# Patient Record
Sex: Female | Born: 1965 | Race: White | Hispanic: No | Marital: Married | Smoking: Never smoker
Health system: Southern US, Community
[De-identification: ages and names within clinical notes are randomized; demographics above are authoritative.]

## PROBLEM LIST (undated history)

## (undated) DIAGNOSIS — E119 Type 2 diabetes mellitus without complications: Secondary | ICD-10-CM

## (undated) DIAGNOSIS — I10 Essential (primary) hypertension: Secondary | ICD-10-CM

## (undated) DIAGNOSIS — D649 Anemia, unspecified: Secondary | ICD-10-CM

## (undated) DIAGNOSIS — N879 Dysplasia of cervix uteri, unspecified: Secondary | ICD-10-CM

## (undated) DIAGNOSIS — G43909 Migraine, unspecified, not intractable, without status migrainosus: Secondary | ICD-10-CM

## (undated) DIAGNOSIS — K219 Gastro-esophageal reflux disease without esophagitis: Secondary | ICD-10-CM

## (undated) DIAGNOSIS — R87619 Unspecified abnormal cytological findings in specimens from cervix uteri: Secondary | ICD-10-CM

## (undated) DIAGNOSIS — N200 Calculus of kidney: Secondary | ICD-10-CM

## (undated) HISTORY — PX: HYSTEROSCOPY W/ ENDOMETRIAL ABLATION: SUR665

## (undated) HISTORY — DX: Migraine, unspecified, not intractable, without status migrainosus: G43.909

## (undated) HISTORY — PX: WISDOM TOOTH EXTRACTION: SHX21

## (undated) HISTORY — DX: Essential (primary) hypertension: I10

## (undated) HISTORY — DX: Type 2 diabetes mellitus without complications: E11.9

## (undated) HISTORY — PX: LASER ABLATION OF THE CERVIX: SHX1949

## (undated) HISTORY — DX: Unspecified abnormal cytological findings in specimens from cervix uteri: R87.619

## (undated) HISTORY — DX: Calculus of kidney: N20.0

---

## 1989-10-09 DIAGNOSIS — R87619 Unspecified abnormal cytological findings in specimens from cervix uteri: Secondary | ICD-10-CM

## 1989-10-09 HISTORY — DX: Unspecified abnormal cytological findings in specimens from cervix uteri: R87.619

## 1999-04-06 ENCOUNTER — Encounter: Payer: Self-pay | Admitting: Emergency Medicine

## 1999-04-06 ENCOUNTER — Emergency Department (HOSPITAL_COMMUNITY): Admission: EM | Admit: 1999-04-06 | Discharge: 1999-04-06 | Payer: Self-pay | Admitting: Emergency Medicine

## 2004-04-29 ENCOUNTER — Other Ambulatory Visit: Admission: RE | Admit: 2004-04-29 | Discharge: 2004-04-29 | Payer: Self-pay | Admitting: Obstetrics and Gynecology

## 2005-07-11 ENCOUNTER — Ambulatory Visit: Payer: Self-pay | Admitting: General Practice

## 2005-08-21 ENCOUNTER — Ambulatory Visit: Payer: Self-pay | Admitting: Family Medicine

## 2005-08-24 ENCOUNTER — Ambulatory Visit: Payer: Self-pay | Admitting: Family Medicine

## 2005-08-30 ENCOUNTER — Ambulatory Visit: Payer: Self-pay | Admitting: Family Medicine

## 2005-10-18 ENCOUNTER — Ambulatory Visit: Payer: Self-pay | Admitting: Internal Medicine

## 2005-11-15 ENCOUNTER — Ambulatory Visit: Payer: Self-pay | Admitting: Internal Medicine

## 2005-11-15 HISTORY — PX: COLONOSCOPY: SHX174

## 2006-07-25 ENCOUNTER — Ambulatory Visit: Payer: Self-pay | Admitting: General Practice

## 2006-07-30 ENCOUNTER — Ambulatory Visit: Payer: Self-pay | Admitting: General Practice

## 2007-04-30 ENCOUNTER — Encounter (INDEPENDENT_AMBULATORY_CARE_PROVIDER_SITE_OTHER): Payer: Self-pay | Admitting: Internal Medicine

## 2007-07-30 ENCOUNTER — Ambulatory Visit: Payer: Self-pay | Admitting: General Practice

## 2007-08-23 ENCOUNTER — Ambulatory Visit (HOSPITAL_COMMUNITY): Admission: RE | Admit: 2007-08-23 | Discharge: 2007-08-23 | Payer: Self-pay | Admitting: *Deleted

## 2008-08-05 ENCOUNTER — Ambulatory Visit: Payer: Self-pay | Admitting: General Practice

## 2009-08-11 ENCOUNTER — Ambulatory Visit: Payer: Self-pay | Admitting: General Practice

## 2010-08-03 ENCOUNTER — Ambulatory Visit: Payer: Self-pay | Admitting: General Practice

## 2011-02-21 NOTE — Op Note (Signed)
NAME:  Diana Lutz, Diana Lutz            ACCOUNT NO.:  [id number]   MEDICAL RECORD NO.:  [id number]          PATIENT TYPE:  AMB   LOCATION:  SDC                           FACILITY:  [facility]   PHYSICIAN:  [place] B. Earlene Plater, M.D.  DATE OF BIRTH:  September 25, 1966   DATE OF PROCEDURE:  08/23/2007  DATE OF DISCHARGE:                               OPERATIVE REPORT   PREOPERATIVE DIAGNOSIS:  Excessive menstrual bleeding.   POSTOPERATIVE DIAGNOSIS:  Excessive menstrual bleeding.   PROCEDURE:  Hysteroscopy and NovaSure.   SURGEON:  Chester Holstein. Earlene Plater, M.D.   ASSISTANT:  None.   ANESTHESIA:  LMA, general, and 20 mL of 1% Nesacaine paracervical block.   FINDINGS:  Normal appearing endometrium.   SPECIMENS:  None.   BLOOD LOSS:  Minimal.   COMPLICATIONS:  None.   INDICATIONS:  Patient with the above history for a minimally invasive  definitive surgical therapy of heavy menstrual bleeding.  The patient  was advised the risks of surgery, including infection, bleeding,  perforation, damage to surrounding organs, as well as the 50% amenorrhea  rate and 30% hypomenorrhea rate after such a procedure.  The patient is  also status post tubal sterilization.   PROCEDURE:  The patient was taken to the operating room and LMA general  anesthesia obtained.  She was prepped and draped in standard fashion.  Bladder emptied with in-and-out catheter.  Exam under anesthesia showed  an anteverted normal-sized uterus, no adnexal masses.   Speculum inserted, paracervical block placed.  Single-tooth attached to  the anterior lip of the cervix.  Uterus sounded to 9 cm.  Cervical  length estimated 4 cm.  Cervix then dilated to allow passage of the  diagnostic hysteroscope.  After being flushed with normal saline, cavity  was inspected.  No focal abnormalities were seen.   The cervix was then dilated to a #23 to allow passage of the NovaSure.  The NovaSure  was inserted to the fundus and the array deployed in a  standard  manner.  CO2 test was performed and passed.  Endometrium was  then ablated for 140 seconds.   The array was then retracted and the device removed.  Hysteroscope  reinserted and good endometrial coverage noted.   The instruments were removed.  Cervix hemostatic.  The patient tolerated  the procedure well, with no complications.  She was taken to the  recovery room awake, alert, and in stable condition.      Gerri Spore B. Earlene Plater, M.D.  Electronically Signed     WBD/MEDQ  D:  08/23/2007  T:  08/24/2007  Job:  [id number]

## 2011-07-18 LAB — DIFFERENTIAL
Basophils Absolute: 0
Basophils Relative: 1
Eosinophils Absolute: 0.1 — ABNORMAL LOW
Eosinophils Relative: 1
Lymphocytes Relative: 27
Lymphs Abs: 1.4
Monocytes Absolute: 0.5
Monocytes Relative: 10
Neutro Abs: 3.2
Neutrophils Relative %: 62

## 2011-07-18 LAB — CBC
HCT: 34.4 — ABNORMAL LOW
Hemoglobin: 11.3 — ABNORMAL LOW
MCHC: 32.8
MCV: 70 — ABNORMAL LOW
Platelets: 221
RBC: 4.91
RDW: 17.4 — ABNORMAL HIGH
WBC: 5.2

## 2011-07-18 LAB — PREGNANCY, URINE: Preg Test, Ur: NEGATIVE

## 2011-08-02 ENCOUNTER — Ambulatory Visit: Payer: Self-pay

## 2011-11-20 ENCOUNTER — Encounter: Payer: Self-pay | Admitting: Internal Medicine

## 2012-07-31 ENCOUNTER — Ambulatory Visit: Payer: Self-pay

## 2013-08-07 ENCOUNTER — Ambulatory Visit: Payer: Self-pay | Admitting: General Practice

## 2014-08-04 ENCOUNTER — Ambulatory Visit: Payer: Self-pay | Admitting: General Practice

## 2015-06-04 ENCOUNTER — Ambulatory Visit (INDEPENDENT_AMBULATORY_CARE_PROVIDER_SITE_OTHER): Payer: No Typology Code available for payment source | Admitting: Primary Care

## 2015-06-04 ENCOUNTER — Encounter: Payer: Self-pay | Admitting: Primary Care

## 2015-06-04 VITALS — BP 126/96 | HR 71 | Temp 97.4°F | Ht 68.0 in | Wt 236.8 lb

## 2015-06-04 DIAGNOSIS — E559 Vitamin D deficiency, unspecified: Secondary | ICD-10-CM | POA: Diagnosis not present

## 2015-06-04 DIAGNOSIS — I10 Essential (primary) hypertension: Secondary | ICD-10-CM | POA: Diagnosis not present

## 2015-06-04 MED ORDER — VITAMIN D (ERGOCALCIFEROL) 1.25 MG (50000 UNIT) PO CAPS: ORAL_CAPSULE | ORAL | Status: DC

## 2015-06-04 NOTE — Progress Notes (Signed)
Subjective:    Patient ID: Diana Lutz, female    DOB: 11-24-1965, 49 y.o.   MRN: [id number]  HPI  Diana Lutz is a 49 year old female who presents today to establish care and discuss the problems mentioned below. Will obtain old records.  1) Elevated Blood Pressure Readings: Recent readings over the past 2 weeks that have been checked by an RN at her occupation:  152/93, 153/108, and 138/93. BP in clinic today is 126/96.   She does endorse headache pressure. Denies chest pain, shortness of breath. She had labs drawn for wellness check through her occupation on 06/01/15 and are mostly unremarkable.   She is working to decrease her weight through cutting back sodas and drinking more water. She changed her diet 2 weeks ago and now consists of:  Breakfast: Eggs or oatmeal. Lunch: Sandwich, low calorie bread Dinner: Meat and vegetable. Snack: Celery, veggies, yogurt Beverages: Water only.  2) Migraines: History of. With aura of numbness around mouth and fingers. Experiences 1-2 migraines per month, will be present to neck and left temporal region. Will take [employer] with relief. Once treated with Imitrex without relief. Currently tolerable.  Review of Systems  Constitutional: Negative for unexpected weight change.  HENT: Negative for rhinorrhea.   Respiratory: Negative for cough and shortness of breath.   Cardiovascular: Negative for chest pain.  Gastrointestinal: Negative for diarrhea and constipation.  Neurological: Positive for headaches. Negative for dizziness and numbness.       Past Medical History  Diagnosis Date  . Hypertension   . Kidney stone   . Abnormal Pap smear of cervix 1991  . Migraines     Social History   Social History  . Marital Status: Married    Spouse Name: N/A  . Number of Children: N/A  . Years of Education: N/A   Occupational History  . Not on file.   Social History Main Topics  . Smoking status: Never Smoker   .  Smokeless tobacco: Not on file  . Alcohol Use: 0.0 oz/week    0 Standard drinks or equivalent per week     Comment: soical  . Drug Use: Not on file  . Sexual Activity: Not on file   Other Topics Concern  . Not on file   Social History Narrative   Married.   3 children   Works for [employer] as Engineer, manufacturing systems.   Enjoys four wheeling, working on the house    No past surgical history on file.  Family History  Problem Relation Age of Onset  . Hypertension Father   . Hypertension Mother   . Stroke Father   . Heart attack Father   . Prostate cancer Father   . Bladder Cancer Father   . Diverticulosis Mother   . Hypertension Brother   . Diabetes Brother     No Known Allergies  No current outpatient prescriptions on file prior to visit.   No current facility-administered medications on file prior to visit.    BP 126/96 mmHg  Pulse 71  Temp(Src) 97.4 F (36.3 C) (Oral)  Ht 5\' 8"  (1.727 m)  Wt 236 lb 12.8 oz (107.412 kg)  BMI 36.01 kg/m2  SpO2 97%    Objective:   Physical Exam  Constitutional: She is oriented to person, place, and time. She appears well-nourished.  Cardiovascular: Normal rate and regular rhythm.   Pulmonary/Chest: Effort normal and breath sounds normal.  Neurological: She is alert and oriented to person, place, and  time.  Skin: Skin is warm and dry.  Psychiatric: She has a normal mood and affect.          Assessment & Plan:

## 2015-06-04 NOTE — Assessment & Plan Note (Addendum)
Elevated readings at work for the past 2 weeks ranging from 150's/90's. Today elevated at 126/92, with headaches. Will start Lisinopril 10 mg tablets. Paper script provided to patient as they have a pharmacy at her occupation. Follow up in 2 weeks for re-evaluation of BP. BMP unremarkable from 06/01/15 Continue healthy diet and exercise.

## 2015-06-04 NOTE — Assessment & Plan Note (Signed)
Presents today with labs drawn 8/23 from [facility] with level of 20. RX sent for 50,000 unit capsules to take once weekly for 12 weeks. Will recheck levels in 12 weeks.

## 2015-06-04 NOTE — Progress Notes (Signed)
Pre visit review using our clinic review tool, if applicable. No additional management support is needed unless otherwise documented below in the visit note. 

## 2015-06-04 NOTE — Patient Instructions (Addendum)
Start Lisinopril 10 mg tablets for high blood pressure. Take 1 tablet by mouth daily.  Continue to work to improve your diet.  Start Vitamin D capsules. Take 1 capsule by mouth once weekly for a total of 12 weeks. We will recheck your vitamin D levels in 3 months.  Follow up in 2 weeks for re-evaluation of blood pressure.  Please schedule a physical with me in the 3 months.   It was a pleasure to meet you today! Please don't hesitate to call me with any questions. Welcome to [facility]!  Hypertension Hypertension, commonly called high blood pressure, is when the force of blood pumping through your arteries is too strong. Your arteries are the blood vessels that carry blood from your heart throughout your body. A blood pressure reading consists of a higher number over a lower number, such as 110/72. The higher number (systolic) is the pressure inside your arteries when your heart pumps. The lower number (diastolic) is the pressure inside your arteries when your heart relaxes. Ideally you want your blood pressure below 120/80. Hypertension forces your heart to work harder to pump blood. Your arteries may become narrow or stiff. Having hypertension puts you at risk for heart disease, stroke, and other problems.  RISK FACTORS Some risk factors for high blood pressure are controllable. Others are not.  Risk factors you cannot control include:   Race. You may be at higher risk if you are African American.  Age. Risk increases with age.  Gender. Men are at higher risk than women before age 35 years. After age 58, women are at higher risk than men. Risk factors you can control include:  Not getting enough exercise or physical activity.  Being overweight.  Getting too much fat, sugar, calories, or salt in your diet.  Drinking too much alcohol. SIGNS AND SYMPTOMS Hypertension does not usually cause signs or symptoms. Extremely high blood pressure (hypertensive crisis) may cause headache,  anxiety, shortness of breath, and nosebleed. DIAGNOSIS  To check if you have hypertension, your health care provider will measure your blood pressure while you are seated, with your arm held at the level of your heart. It should be measured at least twice using the same arm. Certain conditions can cause a difference in blood pressure between your right and left arms. A blood pressure reading that is higher than normal on one occasion does not mean that you need treatment. If one blood pressure reading is high, ask your health care provider about having it checked again. TREATMENT  Treating high blood pressure includes making lifestyle changes and possibly taking medicine. Living a healthy lifestyle can help lower high blood pressure. You may need to change some of your habits. Lifestyle changes may include:  Following the DASH diet. This diet is high in fruits, vegetables, and whole grains. It is low in salt, red meat, and added sugars.  Getting at least 2 hours of brisk physical activity every week.  Losing weight if necessary.  Not smoking.  Limiting alcoholic beverages.  Learning ways to reduce stress. If lifestyle changes are not enough to get your blood pressure under control, your health care provider may prescribe medicine. You may need to take more than one. Work closely with your health care provider to understand the risks and benefits. HOME CARE INSTRUCTIONS  Have your blood pressure rechecked as directed by your health care provider.   Take medicines only as directed by your health care provider. Follow the directions carefully. Blood pressure medicines  must be taken as prescribed. The medicine does not work as well when you skip doses. Skipping doses also puts you at risk for problems.   Do not smoke.   Monitor your blood pressure at home as directed by your health care provider. SEEK MEDICAL CARE IF:   You think you are having a reaction to medicines taken.  You  have recurrent headaches or feel dizzy.  You have swelling in your ankles.  You have trouble with your vision. SEEK IMMEDIATE MEDICAL CARE IF:  You develop a severe headache or confusion.  You have unusual weakness, numbness, or feel faint.  You have severe chest or abdominal pain.  You vomit repeatedly.  You have trouble breathing. MAKE SURE YOU:   Understand these instructions.  Will watch your condition.  Will get help right away if you are not doing well or get worse. Document Released: 09/25/2005 Document Revised: 02/09/2014 Document Reviewed: 07/18/2013 [facility] Patient Information 2015 [place], [place]. This information is not intended to replace advice given to you by your health care provider. Make sure you discuss any questions you have with your health care provider.

## 2015-06-18 ENCOUNTER — Ambulatory Visit: Payer: No Typology Code available for payment source | Admitting: Primary Care

## 2015-06-24 ENCOUNTER — Ambulatory Visit (INDEPENDENT_AMBULATORY_CARE_PROVIDER_SITE_OTHER): Payer: No Typology Code available for payment source | Admitting: Primary Care

## 2015-06-24 ENCOUNTER — Encounter: Payer: Self-pay | Admitting: Primary Care

## 2015-06-24 VITALS — BP 122/72 | HR 69 | Temp 97.5°F | Ht 68.0 in | Wt 236.8 lb

## 2015-06-24 DIAGNOSIS — I10 Essential (primary) hypertension: Secondary | ICD-10-CM

## 2015-06-24 LAB — BASIC METABOLIC PANEL
BUN: 16 mg/dL (ref 6–23)
CO2: 28 mEq/L (ref 19–32)
CREATININE: 0.77 mg/dL (ref 0.40–1.20)
Calcium: 9.3 mg/dL (ref 8.4–10.5)
Chloride: 103 mEq/L (ref 96–112)
GFR: 84.04 mL/min (ref >60.00)
Glucose, Bld: 104 mg/dL — ABNORMAL HIGH (ref 70–99)
Potassium: 4.9 mEq/L (ref 3.5–5.1)
Sodium: 139 mEq/L (ref 135–145)

## 2015-06-24 NOTE — Assessment & Plan Note (Signed)
Stable on Lisinopril 10 mg which was initiated last visit. No CP, SOB, dizziness. Continue same, BMP today. Follow up in November for CPE

## 2015-06-24 NOTE — Progress Notes (Signed)
Pre visit review using our clinic review tool, if applicable. No additional management support is needed unless otherwise documented below in the visit note. 

## 2015-06-24 NOTE — Progress Notes (Signed)
   Subjective:    Patient ID: Diana Lutz, female    DOB: 21-Feb-1966, 49 y.o.   MRN: [id number]  HPI  Diana Lutz is a 49 year old female who presents today for follow up of hypertension. She was evaluated as a new patient on 06/04/15 and noted to have elevated BP. She has readings from the previous 2 weeks which were in the 150's/90's-100's. She was initiated on lisinopril 10 mg during last visit. She was also encouraged to continue efforts towards a healthy lifestyle.  Since her last visit she's feeling well, even better than before. Denies headaches, chest pain, dizziness, cough. BP is stable in clinic today.  BP Readings from Last 3 Encounters:  06/24/15 122/72  06/04/15 126/96     Review of Systems  Eyes: Negative for visual disturbance.  Respiratory: Negative for cough and shortness of breath.   Cardiovascular: Negative for chest pain.  Neurological: Negative for dizziness and headaches.       Past Medical History  Diagnosis Date  . Hypertension   . Kidney stone   . Abnormal Pap smear of cervix 1991  . Migraines     Social History   Social History  . Marital Status: Married    Spouse Name: N/A  . Number of Children: N/A  . Years of Education: N/A   Occupational History  . Not on file.   Social History Main Topics  . Smoking status: Never Smoker   . Smokeless tobacco: Not on file  . Alcohol Use: 0.0 oz/week    0 Standard drinks or equivalent per week     Comment: soical  . Drug Use: Not on file  . Sexual Activity: Not on file   Other Topics Concern  . Not on file   Social History Narrative   Married.   3 children   Works for [employer] as Engineer, manufacturing systems.   Enjoys four wheeling, working on the house    No past surgical history on file.  Family History  Problem Relation Age of Onset  . Hypertension Father   . Hypertension Mother   . Stroke Father   . Heart attack Father   . Prostate cancer Father   . Bladder Cancer Father   .  Diverticulosis Mother   . Hypertension Brother   . Diabetes Brother     No Known Allergies  Current Outpatient Prescriptions on File Prior to Visit  Medication Sig Dispense Refill  . lisinopril (PRINIVIL,ZESTRIL) 10 MG tablet Take 10 mg by mouth daily.    . Vitamin D, Ergocalciferol, (DRISDOL) 50000 UNITS CAPS capsule Take 1 capsule by mouth once weekly for a total of 12 weeks. 4 capsule 2   No current facility-administered medications on file prior to visit.    BP 122/72 mmHg  Pulse 69  Temp(Src) 97.5 F (36.4 C) (Oral)  Ht 5\' 8"  (1.727 m)  Wt 236 lb 12.8 oz (107.412 kg)  BMI 36.01 kg/m2  SpO2 97%    Objective:   Physical Exam  Constitutional: She appears well-nourished.  Cardiovascular: Normal rate and regular rhythm.   Pulmonary/Chest: Effort normal and breath sounds normal.  Skin: Skin is warm and dry.          Assessment & Plan:

## 2015-06-24 NOTE — Patient Instructions (Signed)
Continue Lisinopril for blood pressure.  Check your blood pressure 2-3 times weekly, around the same time daily, after you've been resting for 20 minutes.   Please notify me if you numbers greater than or equal to 140/90 consistently.  Complete lab work prior to leaving today. I will notify you of your results.   Follow up in November as scheduled.  It was a pleasure to see you today!

## 2015-06-25 ENCOUNTER — Encounter: Payer: Self-pay | Admitting: *Deleted

## 2015-06-28 ENCOUNTER — Telehealth: Payer: Self-pay | Admitting: Primary Care

## 2015-06-28 NOTE — Telephone Encounter (Signed)
Patient returned [name]'s call. Patient is asking for Diana Lutz to call her back after 4:30 on her cell phone.

## 2015-06-28 NOTE — Telephone Encounter (Signed)
Tried to call patient but went to voicemail.

## 2015-06-28 NOTE — Telephone Encounter (Signed)
Already sent  letter with results and Kate's comments for patient.

## 2015-06-28 NOTE — Telephone Encounter (Signed)
Called and notified patient of Diana Lutz's comments. Patient verbalized understanding.  

## 2015-06-28 NOTE — Telephone Encounter (Signed)
Pt returned your call.  

## 2015-07-08 ENCOUNTER — Other Ambulatory Visit: Payer: Self-pay

## 2015-07-08 MED ORDER — LISINOPRIL 10 MG PO TABS: 10.0000 mg | ORAL_TABLET | Freq: Every day | ORAL | Status: DC

## 2015-07-08 NOTE — Telephone Encounter (Signed)
Done. Virgel Gess to Marylen Ponto (nurse) at [facility] at [phone].

## 2015-07-08 NOTE — Telephone Encounter (Signed)
Pt request refill lisinopril to wellness clinic at [facility]. Spoke with Marylen Ponto nurse at [facility] and she said to fax prescription to [phone]. Pt has appt on 08/31/15 for CPX. Pt seen 06/24/15.

## 2015-07-26 ENCOUNTER — Other Ambulatory Visit: Payer: Self-pay | Admitting: Obstetrics & Gynecology

## 2015-07-28 ENCOUNTER — Ambulatory Visit
Admission: RE | Admit: 2015-07-28 | Discharge: 2015-07-28 | Disposition: A | Discharge status: Home / Self Care | Payer: No Typology Code available for payment source | Source: Ambulatory Visit | Attending: Physician Assistant | Admitting: Physician Assistant

## 2015-07-28 ENCOUNTER — Other Ambulatory Visit: Payer: Self-pay | Admitting: Physician Assistant

## 2015-07-28 DIAGNOSIS — Z1231 Encounter for screening mammogram for malignant neoplasm of breast: Secondary | ICD-10-CM | POA: Insufficient documentation

## 2015-08-10 NOTE — Patient Instructions (Signed)
Your procedure is scheduled on:  Wednesday, NOV. 9, 2016  Enter through the Main Entrance of [facility] at: 11:00 AM  Pick up the phone at the desk and dial [phone].  Call this number if you have problems the morning of surgery: [phone].  Remember: Do NOT eat food: AFTER MIDNIGHT TUESDAY Do NOT drink clear liquids after: 8:30 AM DAY OF SURGERY Take these medicines the morning of surgery with a SIP OF WATER: LISINOPRIL, OMEPRAZOLE  Do NOT wear jewelry (body piercing), metal hair clips/bobby pins, make-up, or nail polish. Do NOT wear lotions, powders, or perfumes.  You may wear deoderant. Do NOT shave for 48 hours prior to surgery. Do NOT bring valuables to the hospital. Contacts, dentures, or bridgework may not be worn into surgery. Leave suitcase in car.  After surgery it may be brought to your room.  For patients admitted to the hospital, checkout time is 11:00 AM the day of discharge. Have a responsible adult drive you home and stay with you for 24 hours after your procedure

## 2015-08-11 ENCOUNTER — Other Ambulatory Visit: Payer: Self-pay

## 2015-08-11 ENCOUNTER — Encounter (HOSPITAL_COMMUNITY): Payer: Self-pay

## 2015-08-11 ENCOUNTER — Encounter (HOSPITAL_COMMUNITY)
Admission: RE | Admit: 2015-08-11 | Discharge: 2015-08-11 | Disposition: A | Discharge status: Home / Self Care | Payer: No Typology Code available for payment source | Source: Ambulatory Visit | Attending: Obstetrics & Gynecology | Admitting: Obstetrics & Gynecology

## 2015-08-11 DIAGNOSIS — N393 Stress incontinence (female) (male): Secondary | ICD-10-CM | POA: Diagnosis not present

## 2015-08-11 DIAGNOSIS — N8110 Cystocele, unspecified: Secondary | ICD-10-CM | POA: Insufficient documentation

## 2015-08-11 DIAGNOSIS — N816 Rectocele: Secondary | ICD-10-CM | POA: Diagnosis not present

## 2015-08-11 DIAGNOSIS — Z01818 Encounter for other preprocedural examination: Secondary | ICD-10-CM | POA: Diagnosis present

## 2015-08-11 HISTORY — DX: Gastro-esophageal reflux disease without esophagitis: K21.9

## 2015-08-11 HISTORY — DX: Anemia, unspecified: D64.9

## 2015-08-11 LAB — BASIC METABOLIC PANEL
ANION GAP: 2 — AB (ref 5–15)
BUN: 10 mg/dL (ref 6–20)
CALCIUM: 8.7 mg/dL — AB (ref 8.9–10.3)
CHLORIDE: 106 mmol/L (ref 101–111)
CO2: 26 mmol/L (ref 22–32)
Creatinine, Ser: 0.67 mg/dL (ref 0.44–1.00)
GFR calc non Af Amer: >60 mL/min (ref >60)
GLUCOSE: 138 mg/dL — AB (ref 65–99)
Potassium: 4.1 mmol/L (ref 3.5–5.1)
Sodium: 134 mmol/L — ABNORMAL LOW (ref 135–145)

## 2015-08-11 LAB — URINALYSIS, ROUTINE W REFLEX MICROSCOPIC
Bilirubin Urine: NEGATIVE
Glucose, UA: NEGATIVE mg/dL
HGB URINE DIPSTICK: NEGATIVE
Ketones, ur: NEGATIVE mg/dL
LEUKOCYTES UA: NEGATIVE
NITRITE: NEGATIVE
PROTEIN: NEGATIVE mg/dL
UROBILINOGEN UA: 1 mg/dL (ref 0.0–1.0)
pH: 6 (ref 5.0–8.0)

## 2015-08-11 LAB — CBC WITH DIFFERENTIAL/PLATELET
Basophils Absolute: 0 10*3/uL (ref 0.0–0.1)
Basophils Relative: 1 %
Eosinophils Absolute: 0.1 10*3/uL (ref 0.0–0.7)
Eosinophils Relative: 1 %
HEMATOCRIT: 41.7 % (ref 36.0–46.0)
HEMOGLOBIN: 14.3 g/dL (ref 12.0–15.0)
LYMPHS ABS: 1.9 10*3/uL (ref 0.7–4.0)
Lymphocytes Relative: 26 %
MCH: 31 pg (ref 26.0–34.0)
MCHC: 34.3 g/dL (ref 30.0–36.0)
MCV: 90.5 fL (ref 78.0–100.0)
MONO ABS: 0.6 10*3/uL (ref 0.1–1.0)
MONOS PCT: 8 %
NEUTROS ABS: 4.7 10*3/uL (ref 1.7–7.7)
NEUTROS PCT: 64 %
Platelets: 179 10*3/uL (ref 150–400)
RBC: 4.61 MIL/uL (ref 3.87–5.11)
RDW: 13.6 % (ref 11.5–15.5)
WBC: 7.3 10*3/uL (ref 4.0–10.5)

## 2015-08-11 LAB — TYPE AND SCREEN
ABO/RH(D): A POS
ANTIBODY SCREEN: NEGATIVE

## 2015-08-11 LAB — ABO/RH: ABO/RH(D): A POS

## 2015-08-12 NOTE — Telephone Encounter (Signed)
Pt called requesting refills lisinopril to wellness clinic at [facility]; Marylen Ponto out of office; pt should have available refills; pt will ck with Arbie Cookey and cb if needed.

## 2015-08-13 NOTE — Anesthesia Preprocedure Evaluation (Addendum)
Anesthesia Evaluation  Patient identified by MRN, date of birth, ID band Patient awake    Reviewed: Allergy & Precautions, NPO status , Patient's Chart, lab work & pertinent test results  Airway Mallampati: II   Neck ROM: Full    Dental  (+) Teeth Intact, Dental Advisory Given   Pulmonary neg pulmonary ROS,    breath sounds clear to auscultation       Cardiovascular hypertension, Pt. on medications  Rhythm:Regular  EKG 08/11/2015 WNL   Neuro/Psych  Headaches,    GI/Hepatic GERD  Medicated,  Endo/Other    Renal/GU stones     Musculoskeletal negative musculoskeletal ROS (+)   Abdominal (+) + obese,   Peds  Hematology 14/42   Anesthesia Other Findings   Reproductive/Obstetrics                          Anesthesia Physical Anesthesia Plan  ASA: II  Anesthesia Plan: General   Post-op Pain Management:    Induction: Intravenous  Airway Management Planned: Oral ETT  Additional Equipment:   Intra-op Plan:   Post-operative Plan: Extubation in OR  Informed Consent: I have reviewed the patients History and Physical, chart, labs and discussed the procedure including the risks, benefits and alternatives for the proposed anesthesia with the patient or authorized representative who has indicated his/her understanding and acceptance.     Plan Discussed with:   Anesthesia Plan Comments: (BMI 37, GERD, GA with ETT)       Anesthesia Quick Evaluation

## 2015-08-18 ENCOUNTER — Ambulatory Visit (HOSPITAL_COMMUNITY): Payer: No Typology Code available for payment source | Admitting: Anesthesiology

## 2015-08-18 ENCOUNTER — Observation Stay (HOSPITAL_COMMUNITY)
Admission: RE | Admit: 2015-08-18 | Discharge: 2015-08-19 | Disposition: A | Discharge status: Home / Self Care | Payer: No Typology Code available for payment source | Source: Ambulatory Visit | Attending: Obstetrics & Gynecology | Admitting: Obstetrics & Gynecology

## 2015-08-18 ENCOUNTER — Encounter (HOSPITAL_COMMUNITY): Payer: Self-pay

## 2015-08-18 ENCOUNTER — Encounter (HOSPITAL_COMMUNITY)
Admission: RE | Disposition: A | Discharge status: Home / Self Care | Payer: Self-pay | Source: Ambulatory Visit | Attending: Obstetrics & Gynecology

## 2015-08-18 DIAGNOSIS — K219 Gastro-esophageal reflux disease without esophagitis: Secondary | ICD-10-CM | POA: Diagnosis not present

## 2015-08-18 DIAGNOSIS — Z6837 Body mass index (BMI) 37.0-37.9, adult: Secondary | ICD-10-CM | POA: Diagnosis not present

## 2015-08-18 DIAGNOSIS — N393 Stress incontinence (female) (male): Secondary | ICD-10-CM | POA: Insufficient documentation

## 2015-08-18 DIAGNOSIS — Z8541 Personal history of malignant neoplasm of cervix uteri: Secondary | ICD-10-CM | POA: Diagnosis not present

## 2015-08-18 DIAGNOSIS — I10 Essential (primary) hypertension: Secondary | ICD-10-CM | POA: Diagnosis not present

## 2015-08-18 DIAGNOSIS — N8110 Cystocele, unspecified: Secondary | ICD-10-CM | POA: Diagnosis not present

## 2015-08-18 DIAGNOSIS — Z87442 Personal history of urinary calculi: Secondary | ICD-10-CM | POA: Insufficient documentation

## 2015-08-18 DIAGNOSIS — N816 Rectocele: Secondary | ICD-10-CM | POA: Diagnosis not present

## 2015-08-18 DIAGNOSIS — E669 Obesity, unspecified: Secondary | ICD-10-CM | POA: Insufficient documentation

## 2015-08-18 DIAGNOSIS — Z79899 Other long term (current) drug therapy: Secondary | ICD-10-CM | POA: Diagnosis not present

## 2015-08-18 HISTORY — PX: BLADDER SUSPENSION: SHX72

## 2015-08-18 HISTORY — DX: Dysplasia of cervix uteri, unspecified: N87.9

## 2015-08-18 HISTORY — PX: ANTERIOR AND POSTERIOR REPAIR: SHX5121

## 2015-08-18 HISTORY — PX: CYSTOSCOPY: SHX5120

## 2015-08-18 SURGERY — ANTERIOR (CYSTOCELE) AND POSTERIOR REPAIR (RECTOCELE)
Anesthesia: General

## 2015-08-18 MED ORDER — SODIUM CHLORIDE 0.9 % IJ SOLN
INTRAMUSCULAR | Status: AC
  Filled 2015-08-18: qty 10

## 2015-08-18 MED ORDER — MENTHOL 3 MG MT LOZG: 1.0000 | LOZENGE | OROMUCOSAL | Status: DC | PRN

## 2015-08-18 MED ORDER — OXYCODONE-ACETAMINOPHEN 5-325 MG PO TABS
1.0000 | ORAL_TABLET | ORAL | Status: DC | PRN
  Administered 2015-08-18 – 2015-08-19 (×2): 1 via ORAL
  Filled 2015-08-18 (×2): qty 1

## 2015-08-18 MED ORDER — VASOPRESSIN 20 UNIT/ML IV SOLN
INTRAVENOUS | Status: AC
  Filled 2015-08-18: qty 1

## 2015-08-18 MED ORDER — STERILE WATER FOR IRRIGATION IR SOLN
Status: DC | PRN
  Administered 2015-08-18: 1

## 2015-08-18 MED ORDER — ONDANSETRON HCL 4 MG PO TABS: 4.0000 mg | ORAL_TABLET | Freq: Four times a day (QID) | ORAL | Status: DC | PRN

## 2015-08-18 MED ORDER — PANTOPRAZOLE SODIUM 40 MG PO TBEC: 40.0000 mg | DELAYED_RELEASE_TABLET | Freq: Every day | ORAL | Status: DC

## 2015-08-18 MED ORDER — PROMETHAZINE HCL 25 MG/ML IJ SOLN
6.2500 mg | INTRAMUSCULAR | Status: DC | PRN
  Administered 2015-08-18: 6.25 mg via INTRAVENOUS

## 2015-08-18 MED ORDER — DEXAMETHASONE SODIUM PHOSPHATE 10 MG/ML IJ SOLN
INTRAMUSCULAR | Status: DC | PRN
  Administered 2015-08-18: 4 mg via INTRAVENOUS

## 2015-08-18 MED ORDER — NEOSTIGMINE METHYLSULFATE 10 MG/10ML IV SOLN
INTRAVENOUS | Status: DC | PRN
  Administered 2015-08-18: 1 mg via INTRAVENOUS

## 2015-08-18 MED ORDER — LIDOCAINE HCL (CARDIAC) 20 MG/ML IV SOLN
INTRAVENOUS | Status: DC | PRN
  Administered 2015-08-18: 30 mg via INTRAVENOUS
  Administered 2015-08-18: 70 mg via INTRAVENOUS

## 2015-08-18 MED ORDER — ONDANSETRON HCL 4 MG/2ML IJ SOLN: 4.0000 mg | Freq: Four times a day (QID) | INTRAMUSCULAR | Status: DC | PRN

## 2015-08-18 MED ORDER — GLYCOPYRROLATE 0.2 MG/ML IJ SOLN
INTRAMUSCULAR | Status: DC | PRN
  Administered 2015-08-18: 0.2 mg via INTRAVENOUS

## 2015-08-18 MED ORDER — SCOPOLAMINE 1 MG/3DAYS TD PT72
1.0000 | MEDICATED_PATCH | Freq: Once | TRANSDERMAL | Status: DC
  Administered 2015-08-18: 1.5 mg via TRANSDERMAL

## 2015-08-18 MED ORDER — ROCURONIUM BROMIDE 100 MG/10ML IV SOLN
INTRAVENOUS | Status: AC
  Filled 2015-08-18: qty 1

## 2015-08-18 MED ORDER — ROCURONIUM BROMIDE 100 MG/10ML IV SOLN
INTRAVENOUS | Status: DC | PRN
  Administered 2015-08-18: 40 mg via INTRAVENOUS

## 2015-08-18 MED ORDER — GLYCOPYRROLATE 0.2 MG/ML IJ SOLN
INTRAMUSCULAR | Status: AC
  Filled 2015-08-18: qty 3

## 2015-08-18 MED ORDER — CEFAZOLIN SODIUM-DEXTROSE 2-3 GM-% IV SOLR
2.0000 g | INTRAVENOUS | Status: AC
  Administered 2015-08-18: 2 g via INTRAVENOUS

## 2015-08-18 MED ORDER — LISINOPRIL 10 MG PO TABS
10.0000 mg | ORAL_TABLET | Freq: Every day | ORAL | Status: DC
  Filled 2015-08-18: qty 1

## 2015-08-18 MED ORDER — FENTANYL CITRATE (PF) 250 MCG/5ML IJ SOLN
INTRAMUSCULAR | Status: AC
  Filled 2015-08-18: qty 25

## 2015-08-18 MED ORDER — FENTANYL CITRATE (PF) 100 MCG/2ML IJ SOLN
INTRAMUSCULAR | Status: AC
  Administered 2015-08-18: 25 ug via INTRAVENOUS
  Filled 2015-08-18: qty 2

## 2015-08-18 MED ORDER — MIDAZOLAM HCL 2 MG/2ML IJ SOLN
INTRAMUSCULAR | Status: DC | PRN
  Administered 2015-08-18: 2 mg via INTRAVENOUS

## 2015-08-18 MED ORDER — PROPOFOL 10 MG/ML IV BOLUS
INTRAVENOUS | Status: DC | PRN
  Administered 2015-08-18: 30 mg via INTRAVENOUS
  Administered 2015-08-18: 170 mg via INTRAVENOUS

## 2015-08-18 MED ORDER — LACTATED RINGERS IV SOLN
INTRAVENOUS | Status: DC
Start: 2015-08-18 — End: 2015-08-18
  Administered 2015-08-18: 11:00:00 via INTRAVENOUS
  Administered 2015-08-18: 125 mL/h via INTRAVENOUS
  Administered 2015-08-18: 13:00:00 via INTRAVENOUS

## 2015-08-18 MED ORDER — FENTANYL CITRATE (PF) 100 MCG/2ML IJ SOLN
25.0000 ug | INTRAMUSCULAR | Status: DC | PRN
  Administered 2015-08-18 (×2): 25 ug via INTRAVENOUS

## 2015-08-18 MED ORDER — FENTANYL CITRATE (PF) 100 MCG/2ML IJ SOLN
INTRAMUSCULAR | Status: AC
  Filled 2015-08-18: qty 4

## 2015-08-18 MED ORDER — PROMETHAZINE HCL 25 MG/ML IJ SOLN
INTRAMUSCULAR | Status: AC
  Administered 2015-08-18: 6.25 mg via INTRAVENOUS
  Filled 2015-08-18: qty 1

## 2015-08-18 MED ORDER — DEXAMETHASONE SODIUM PHOSPHATE 4 MG/ML IJ SOLN
INTRAMUSCULAR | Status: AC
  Filled 2015-08-18: qty 1

## 2015-08-18 MED ORDER — BUPIVACAINE-EPINEPHRINE (PF) 0.25% -1:200000 IJ SOLN
INTRAMUSCULAR | Status: AC
  Filled 2015-08-18: qty 60

## 2015-08-18 MED ORDER — MEPERIDINE HCL 25 MG/ML IJ SOLN: 6.2500 mg | INTRAMUSCULAR | Status: DC | PRN

## 2015-08-18 MED ORDER — CEFAZOLIN SODIUM-DEXTROSE 2-3 GM-% IV SOLR
INTRAVENOUS | Status: AC
  Filled 2015-08-18: qty 50

## 2015-08-18 MED ORDER — PROPOFOL 10 MG/ML IV BOLUS
INTRAVENOUS | Status: AC
  Filled 2015-08-18: qty 20

## 2015-08-18 MED ORDER — ESTRADIOL 0.1 MG/GM VA CREA
TOPICAL_CREAM | VAGINAL | Status: DC | PRN
  Administered 2015-08-18: 1 via VAGINAL

## 2015-08-18 MED ORDER — ONDANSETRON HCL 4 MG/2ML IJ SOLN
INTRAMUSCULAR | Status: DC | PRN
  Administered 2015-08-18: 4 mg via INTRAVENOUS

## 2015-08-18 MED ORDER — SCOPOLAMINE 1 MG/3DAYS TD PT72
MEDICATED_PATCH | TRANSDERMAL | Status: AC
  Administered 2015-08-18: 1.5 mg via TRANSDERMAL
  Filled 2015-08-18: qty 1

## 2015-08-18 MED ORDER — KETOROLAC TROMETHAMINE 30 MG/ML IJ SOLN: 30.0000 mg | Freq: Once | INTRAMUSCULAR | Status: DC

## 2015-08-18 MED ORDER — FENTANYL CITRATE (PF) 100 MCG/2ML IJ SOLN
INTRAMUSCULAR | Status: DC | PRN
  Administered 2015-08-18 (×6): 50 ug via INTRAVENOUS

## 2015-08-18 MED ORDER — KETOROLAC TROMETHAMINE 30 MG/ML IJ SOLN
INTRAMUSCULAR | Status: DC | PRN
  Administered 2015-08-18: 30 mg via INTRAVENOUS

## 2015-08-18 MED ORDER — VASOPRESSIN 20 UNIT/ML IV SOLN
INTRAVENOUS | Status: DC | PRN
  Administered 2015-08-18: 60 mL via INTRAMUSCULAR

## 2015-08-18 MED ORDER — ESTRADIOL 0.1 MG/GM VA CREA
TOPICAL_CREAM | VAGINAL | Status: AC
  Filled 2015-08-18: qty 42.5

## 2015-08-18 MED ORDER — MIDAZOLAM HCL 2 MG/2ML IJ SOLN
INTRAMUSCULAR | Status: AC
  Filled 2015-08-18: qty 4

## 2015-08-18 MED ORDER — ONDANSETRON HCL 4 MG/2ML IJ SOLN
INTRAMUSCULAR | Status: AC
  Filled 2015-08-18: qty 2

## 2015-08-18 MED ORDER — SODIUM CHLORIDE 0.9 % IJ SOLN
INTRAMUSCULAR | Status: AC
Start: 2015-08-18 — End: 2015-08-18
  Filled 2015-08-18: qty 50

## 2015-08-18 MED ORDER — LIDOCAINE HCL (CARDIAC) 20 MG/ML IV SOLN
INTRAVENOUS | Status: AC
  Filled 2015-08-18: qty 5

## 2015-08-18 MED ORDER — KETOROLAC TROMETHAMINE 30 MG/ML IJ SOLN
INTRAMUSCULAR | Status: AC
  Filled 2015-08-18: qty 1

## 2015-08-18 SURGICAL SUPPLY — 34 items
BAG URINE DRAINAGE (UROLOGICAL SUPPLIES) ×2 IMPLANT
BLADE SURG 15 STRL LF C SS BP (BLADE) ×2 IMPLANT
BLADE SURG 15 STRL SS (BLADE) ×4
CANISTER SUCT 3000ML (MISCELLANEOUS) ×2 IMPLANT
CATH FOLEY 2WAY SLVR  5CC 16FR (CATHETERS) ×1
CATH FOLEY 2WAY SLVR  5CC 18FR (CATHETERS) ×1
CATH FOLEY 2WAY SLVR 5CC 16FR (CATHETERS) ×1 IMPLANT
CATH FOLEY 2WAY SLVR 5CC 18FR (CATHETERS) ×1 IMPLANT
CLOTH BEACON ORANGE TIMEOUT ST (SAFETY) ×2 IMPLANT
CONT PATH 16OZ SNAP LID 3702 (MISCELLANEOUS) IMPLANT
DECANTER SPIKE VIAL GLASS SM (MISCELLANEOUS) ×1 IMPLANT
DRAPE STERI URO 9X17 APER PCH (DRAPES) ×2 IMPLANT
GAUZE PACKING 1 X5 YD ST (GAUZE/BANDAGES/DRESSINGS) IMPLANT
GLOVE BIO SURGEON STRL SZ7 (GLOVE) ×2 IMPLANT
GLOVE BIOGEL PI IND STRL 7.0 (GLOVE) ×1 IMPLANT
GLOVE BIOGEL PI INDICATOR 7.0 (GLOVE) ×1
GOWN STRL REUS W/TWL LRG LVL3 (GOWN DISPOSABLE) ×7 IMPLANT
LIQUID BAND (GAUZE/BANDAGES/DRESSINGS) ×2 IMPLANT
NEEDLE HYPO 22GX1.5 SAFETY (NEEDLE) ×2 IMPLANT
NS IRRIG 1000ML POUR BTL (IV SOLUTION) ×2 IMPLANT
PACK VAGINAL WOMENS (CUSTOM PROCEDURE TRAY) ×2 IMPLANT
RETRACTOR STAY HOOK 5MM (MISCELLANEOUS) IMPLANT
SET CYSTO W/LG BORE CLAMP LF (SET/KITS/TRAYS/PACK) ×2 IMPLANT
SLING TVT EXACT (Sling) ×1 IMPLANT
SUT VIC AB 0 CT1 27 (SUTURE)
SUT VIC AB 0 CT1 27XBRD ANBCTR (SUTURE) IMPLANT
SUT VIC AB 2-0 SH 27 (SUTURE) ×4
SUT VIC AB 2-0 SH 27XBRD (SUTURE) ×1 IMPLANT
SUT VIC AB 3-0 SH 27 (SUTURE) ×8
SUT VIC AB 3-0 SH 27X BRD (SUTURE) IMPLANT
TOWEL OR 17X24 6PK STRL BLUE (TOWEL DISPOSABLE) ×4 IMPLANT
TRAY FOLEY CATH SILVER 14FR (SET/KITS/TRAYS/PACK) ×2 IMPLANT
WATER STERILE IRR 1000ML POUR (IV SOLUTION) ×2 IMPLANT
YANKAUER SUCT BULB TIP NO VENT (SUCTIONS) ×1 IMPLANT

## 2015-08-18 NOTE — H&P (Signed)
Diana Lutz is an 49 y.o. female presents for surgery for vaginal prolapse and stress urinary incontinence. She is noted to have Cystocele, Rectocele without uterine/ apical prolapse and has genuine stress urinary incontinence with large bladder capacity, normal pressure urethra on office Urodynamics. She G3P3, 3 vag deliveries, kids 8-9 lbs each. Endometrial ablation in 2008 for menorrhagia. Birth control- vasectomy.  Remote abn Pap in 90s, normal since, normal recent Pap and HPV neg. No breast complaints, normal mammogram.   Patient's last menstrual period was 08/16/2015.    Past Medical History  Diagnosis Date  . Hypertension   . Kidney stone   . Abnormal Pap smear of cervix 1991  . Migraines   . Cancer ([place])     cervical ca  . GERD (gastroesophageal reflux disease)   . Anemia     history of anemia    Past Surgical History  Procedure Laterality Date  . Hysteroscopy w/ endometrial ablation    . Laser ablation of the cervix    . Wisdom tooth extraction      Family History  Problem Relation Age of Onset  . Hypertension Father   . Hypertension Mother   . Stroke Father   . Heart attack Father   . Prostate cancer Father   . Bladder Cancer Father   . Diverticulosis Mother   . Hypertension Brother   . Diabetes Brother     Social History:  reports that she has never smoked. She has never used smokeless tobacco. She reports that she drinks alcohol. She reports that she does not use illicit drugs.  Allergies: No Known Allergies  Prescriptions prior to admission  Medication Sig Dispense Refill Last Dose  . acetaminophen (TYLENOL) 500 MG tablet Take 1,000 mg by mouth every 6 (six) hours as needed for moderate pain or headache.   Past Week at Unknown time  . calcium carbonate (TUMS - DOSED IN MG ELEMENTAL CALCIUM) 500 MG chewable tablet Chew 2 tablets by mouth daily as needed for indigestion or heartburn.   Past Month at Unknown time  . lisinopril (PRINIVIL,ZESTRIL) 10 MG  tablet Take 1 tablet (10 mg total) by mouth daily. 30 tablet 5 08/18/2015 at 0800  . omeprazole (PRILOSEC) 20 MG capsule Take 20 mg by mouth daily.   08/18/2015 at 0800  . Vitamin D, Ergocalciferol, (DRISDOL) 50000 UNITS CAPS capsule Take 1 capsule by mouth once weekly for a total of 12 weeks. 4 capsule 2 Past Month at Unknown time    ROS neg  Blood pressure 124/87, pulse 69, temperature 98 F (36.7 C), temperature source Oral, resp. rate 18, last menstrual period 08/16/2015, SpO2 99 %. Physical Exam   A&O x 3, no acute distress. Pleasant HEENT neg, no thyromegaly Lungs CTA bilat CV RRR, S1S2 normal Abdo soft, non tender, non acute Extr no edema/ tenderness Pelvic Uterus normal size. Grade 2 Cystocele, Grade 3 Rectocele noted.  Urodynamics reviewed.    Assessment/Plan: 49 yo, here for Cytocele/Rectocele repair and TVT sling and Cystoscopy.   Risks/complications of surgery reviewed incl infection, bleeding, damage to internal organs including bladder, bowels, ureters, blood vessels, other risks from anesthesia, VTE and delayed complications of any surgery, complications in future surgery reviewed. Reviewed recurrence of prolapse, uterine prolapse, recurrence of incontinence, denovo urgency, mesh erosion and need for future surgeries. Pt understands and agrees.   Raynelle Fujikawa R 08/18/2015, 11:45 AM

## 2015-08-18 NOTE — Transfer of Care (Signed)
Immediate Anesthesia Transfer of Care Note  Patient: Diana Lutz  Procedure(s) Performed: Procedure(s) with comments: ANTERIOR (CYSTOCELE) AND POSTERIOR REPAIR (RECTOCELE)/Perineorrhaphy (N/A) - 2 hrs. TRANSVAGINAL TAPE (TVT) Mid-Urethral Sling PROCEDURE (N/A) CYSTOSCOPY (N/A)  Patient Location: PACU  Anesthesia Type:General  Level of Consciousness: awake, alert , oriented and patient cooperative  Airway & Oxygen Therapy: Patient Spontanous Breathing and Patient connected to nasal cannula oxygen  Post-op Assessment: Report given to RN and Post -op Vital signs reviewed and stable  Post vital signs: Reviewed and stable  Last Vitals:  Filed Vitals:   08/18/15 1528  BP:   Pulse: 74  Temp: 37.1 C  Resp:     Complications: No apparent anesthesia complications

## 2015-08-18 NOTE — Anesthesia Postprocedure Evaluation (Signed)
  Anesthesia Post-op Note  Patient: Diana Lutz  Procedure(s) Performed: Procedure(s) with comments: ANTERIOR (CYSTOCELE) AND POSTERIOR REPAIR (RECTOCELE)/Perineorrhaphy (N/A) - 2 hrs. TRANSVAGINAL TAPE (TVT) Mid-Urethral Sling PROCEDURE (N/A) CYSTOSCOPY (N/A)  Patient Location: PACU  Anesthesia Type:General  Level of Consciousness: awake, alert  and oriented  Airway and Oxygen Therapy: Patient Spontanous Breathing and Patient connected to nasal cannula oxygen  Post-op Pain: mild  Post-op Assessment: Post-op Vital signs reviewed, Patient's Cardiovascular Status Stable, Respiratory Function Stable, Patent Airway, No signs of Nausea or vomiting and Pain level controlled              Post-op Vital Signs: Reviewed and stable  Last Vitals:  Filed Vitals:   08/18/15 1600  BP: 120/61  Pulse: 57  Temp:   Resp: 15    Complications: No apparent anesthesia complications

## 2015-08-18 NOTE — Anesthesia Procedure Notes (Signed)
Procedure Name: Intubation Date/Time: 08/18/2015 12:39 PM Performed by: Tobin Chad Pre-anesthesia Checklist: Patient identified, Patient being monitored, Emergency Drugs available, Timeout performed and Suction available Patient Re-evaluated:Patient Re-evaluated prior to inductionOxygen Delivery Method: Simple face mask and Circle system utilized Preoxygenation: Pre-oxygenation with 100% oxygen Intubation Type: IV induction Ventilation: Mask ventilation without difficulty Laryngoscope Size: Mac and 3 Grade View: Grade III Tube type: Oral Tube size: 7.0 mm Number of attempts: 1 Airway Equipment and Method: Stylet Placement Confirmation: breath sounds checked- equal and bilateral and positive ETCO2 Secured at: 21 (com) cm Tube secured with: Tape Dental Injury: Teeth and Oropharynx as per pre-operative assessment

## 2015-08-18 NOTE — Op Note (Signed)
PRE-OPERATIVE DIAGNOSIS:  Grade II Cystocele, Grade III Rectocele, Stress Urinary Incontinence    POST-OPERATIVE DIAGNOSIS:  SAME  PROCEDURE: Anterior Colporrhaphy, posterior Colporrhaphy, Perineorrhaphy, Mid- Urethral Sling, Cystoscopy  SURGEON:  Elveria Royals, MD - Primary  ASSISTANTS: Sullivan Lone, RNFA  ANESTHESIA:  General  LOCAL MEDICATIONS USED:  20 cc Vasopressin and 60 cc Saline  IV FLUIDS: 1500 cc LR   EBL: 50  cc  Urine Output: 100  clear in foley  SPECIMEN: None  DISPOSITION OF SPECIMEN:  PACU and then admit for observation  COUNTS:  YES   INDICATION: Symptomatic cystocele, rectocele and stress urinary incontinence, no uterine prolapse.    PROCEDURE:  Informed written consent obtained after reviewing risks/ complications. Patient declined any mesh use for vaginal repair but gave consent to use TVT-mesh for urinary incontinence sling.  She was brought to the Operating Room with IV running. She received 2 gm Ancef. She underwent general anesthesia without complications. She was given dorsal lithotomy position. Exam under anesthesia noted grade II Cystocele and grade III Rectocele and a gaping perineum. Urethral support loss noted as well. Tenaculum applied on cervix and pulled, no uterine descent noted.   Cystocele repair:  Cystocele was assessed and Allis clamp applied 3 cm below urethral meatus in the midline and another Allis clamp applied 1 cm from the cervix. Laterally one Allis clamp applied on either side of the midline to define the area of cystocele. Dilute Pitressin injected in the midline and laterally to help dissection. Vertical incision made on vaginal wall in the midline and several Allis clamps applied to the cut edges. Now the endopelvic fascia with the cystocele were dissected off the vaginal wall but blunt and sharp dissection. Bleeding cauterized. A large defect in the fascia noted in the midline. Endopelvic fascia was sutured in the midline reducing  the cystocele, using 2-0 Vicryl. Vaginal edges were trimmed to remove excess wall and then sutured in the midline with 3-0 Vicryl while grasping the fascia in the bites to reduce dead space and control hemostasis.    Rectocele repair and Perineaorrhaphy 2 Allis Clamps applied to outer edge of perineum below the hymen to mark outermost edge for perineorrhaphy. Infiltation fluid injected. Inverted triangle incision made at the posterior fourchette. Allis clamps placed on outer edges of vaginal cut and a central Allis placed over vaginal mucosa covering the Rectocele. Infiltration done along the midline over the Rectocele under vaginal mucosa. With Strulley scissors vaginal mucosa was carefully undermined and incised in midline. Allis clamps were applied as we moved up towards the highest point of rectocele (about 3 cm below the cervix). Now vaginal cut edges on both sides were grasped and with blunt and sharp dissection the endopelvic fascia was dissected away from vaginal mucosa. Bleeding cauterized. A large defect in the fascia noted on the left noting parietal peritoneum but was not entered. Fascia was torn from left side. Rectal exam performed to assess integrity of dissection, no rectal injury noted. Endopelvic fascia was approximated over Rectocele hernia pouch with 2-0 Vicryl in running fashion while reducing the rectocele. Excess vaginal wall was excised. Vaginal mucosa was approximated in midline with 3-0 Vicryl.  Posterior perineorrhaphy done with a crown stitch to create good perineal body with 2-0-Vicryl, then submucosa approximated, followed by perineal skin with 3-0 Vicryl. Hemostasis excellent.   TVT Sling, Cystoscopy:   30 cc of dilute Pitressin solution was injected into the suprapubic space bilaterally with spinal needle from the planned abdominal exit points  behind the pubic symphysis and into the retropubic space.  A #18 Foley catheter was then placed to drain the bladder the balloon was  inflated and used to help identify the bladder and to plan the sling placement in the mid urethral position. Two Allis clamps placed 2 cm below the urethral meatus (in the area of mid urethra) on either side of the midline. 10 cc of the same Pitressin solution injected between the 2 Allis clamps with lateral spread in the area of vaginal tunnels. A #15 blade was used to incise between the Allis clamps and with Metzenbaum scissors vaginal mucosa dissected to creat tunnels toward retropubic space under the pubic ramus in the direction of ipsilateral shoulder.  Once the entry routes were dissected vaginally, a rigid Foley guide was placed through the foley into the bladder. Bladder neck was deviated to the left by moving the guide to patient's right thigh and right retropubic space was targeted. TVT sling plastic sleeve was mounted on blunt 90mm trocar and was inserted into the pre-dissected space and was pushed under the pubic bone, and while hugging the bone and dropping the trocar handle towards the ground it was directed to the abdominal wall, bringing it out through the premarked right exit incision. A Kelly clamp was placed on the plastic sleeve tip and the trocar was removed through the vaginal incision. The trocar was placed in the opposite TVT sling sleeve. Bladder neck was deviated to right by moving the foley with guide to the left thigh and opening up left retropubic space and the trocar was inserted into the left pre-dissected space and was pushed under the pubic bone, and while hugging the bone and dropping the trocar handle towards the ground it was directed to the abdominal wall, bringing it out through the premarked left exit incision. Kelly clamp placed on plastic sleeve and metal trocar removed.  Cystoscopy was performed with saline for irrigation. Bladder was filled to 500 cc and evaluation was done of the entire bladder mucosa and the urethra to ensure no sling material was eroding through the  mucosa. FIndings were clear. Bilateral urine efflux noted from ureteral opening, no evidence of bladder injury noted. Cystoscope removed and bladder emptied with foley.  Sling was pulled from both exit sites while leave a Mayo scissors between sling and vaginal space to ensure "tension-free" placement. Plastic covers on sling were removed by gentle pull and excess sling cut at the abdominal exit sites.  Good hemostasis was noted. The vaginal mucosa was closed with 3-0 vicryl. Skip incisions approximated with Derma-bond.   Estrace cream applied in the vagina followed by vaginal packing with 1/2 inch vaginal tape. Will keep pack overnight with foley.  All counts correct, procedure completed without complications. Patient stable, brought to PACU and will be admitted overnight for observation with vaginal packing.   I performed the entire procedure.   V.Betrice Wanat, MD

## 2015-08-19 ENCOUNTER — Encounter (HOSPITAL_COMMUNITY): Payer: Self-pay | Admitting: Obstetrics & Gynecology

## 2015-08-19 DIAGNOSIS — N8110 Cystocele, unspecified: Secondary | ICD-10-CM | POA: Diagnosis not present

## 2015-08-19 LAB — BASIC METABOLIC PANEL
ANION GAP: 6 (ref 5–15)
BUN: 10 mg/dL (ref 6–20)
CO2: 26 mmol/L (ref 22–32)
Calcium: 8.4 mg/dL — ABNORMAL LOW (ref 8.9–10.3)
Chloride: 105 mmol/L (ref 101–111)
Creatinine, Ser: 0.78 mg/dL (ref 0.44–1.00)
GLUCOSE: 140 mg/dL — AB (ref 65–99)
POTASSIUM: 4.4 mmol/L (ref 3.5–5.1)
Sodium: 137 mmol/L (ref 135–145)

## 2015-08-19 LAB — CBC
HEMATOCRIT: 35.8 % — AB (ref 36.0–46.0)
Hemoglobin: 12 g/dL (ref 12.0–15.0)
MCH: 30.8 pg (ref 26.0–34.0)
MCHC: 33.5 g/dL (ref 30.0–36.0)
MCV: 92 fL (ref 78.0–100.0)
PLATELETS: 181 10*3/uL (ref 150–400)
RBC: 3.89 MIL/uL (ref 3.87–5.11)
RDW: 13.2 % (ref 11.5–15.5)
WBC: 12.8 10*3/uL — AB (ref 4.0–10.5)

## 2015-08-19 MED ORDER — OXYCODONE-ACETAMINOPHEN 5-325 MG PO TABS: 1.0000 | ORAL_TABLET | Freq: Four times a day (QID) | ORAL | Status: DC | PRN

## 2015-08-19 NOTE — Progress Notes (Signed)
Vaginal packing removed as ordered. Pt tolerated well. Minimal serosanguineous drainage noted.

## 2015-08-19 NOTE — Progress Notes (Signed)
1 Day Post-Op Procedure(s) (LRB): ANTERIOR (CYSTOCELE) AND POSTERIOR REPAIR (RECTOCELE)/Perineorrhaphy (N/A) TRANSVAGINAL TAPE (TVT) Mid-Urethral Sling PROCEDURE (N/A) CYSTOSCOPY (N/A)  Subjective: Patient reports tolerating PO, + flatus and no problems voiding.  Ambulating. Foley out and vag pack out this morning . No bleeding.   Objective: I have reviewed patient's vital signs, intake and output, medications and labs.  General: alert and cooperative Resp: clear to auscultation bilaterally Cardio: regular rate and rhythm, S1, S2 normal, no murmur, click, rub or gallop GI: soft, non-tender; bowel sounds normal; no masses,  no organomegaly Extremities: extremities normal, atraumatic, no cyanosis or edema Vaginal Bleeding: none  Assessment: s/p Procedure(s) with comments: ANTERIOR (CYSTOCELE) AND POSTERIOR REPAIR (RECTOCELE)/Perineorrhaphy (N/A) - 2 hrs. TRANSVAGINAL TAPE (TVT) Mid-Urethral Sling PROCEDURE (N/A) CYSTOSCOPY (N/A): stable, progressing well and tolerating diet and voiding since Foley removed.   Plan: Encourage ambulation Discontinue IV fluids Discharge home if passed voiding trial, check next void and post-void residual and okay to go home if <50% retained, RN informed.  F/up with me in office in 2 wks Post-op care, precautions, warning s/s reviewed.    Diana Lutz R 08/19/2015, 8:21 AM

## 2015-08-19 NOTE — Anesthesia Postprocedure Evaluation (Signed)
  Anesthesia Post-op Note  Patient: Diana Lutz  Procedure(s) Performed: Procedure(s) with comments: ANTERIOR (CYSTOCELE) AND POSTERIOR REPAIR (RECTOCELE)/Perineorrhaphy (N/A) - 2 hrs. TRANSVAGINAL TAPE (TVT) Mid-Urethral Sling PROCEDURE (N/A) CYSTOSCOPY (N/A)  Patient Location: PACU and [facility] Unit  Anesthesia Type:General  Level of Consciousness: awake, alert , oriented and patient cooperative  Airway and Oxygen Therapy: Patient Spontanous Breathing  Post-op Pain: none  Post-op Assessment: Post-op Vital signs reviewed, Patient's Cardiovascular Status Stable, Respiratory Function Stable, Patent Airway, No signs of Nausea or vomiting, Adequate PO intake and Pain level controlled              Post-op Vital Signs: Reviewed and stable  Last Vitals:  Filed Vitals:   08/19/15 0553  BP: 103/58  Pulse: 52  Temp: 36.6 C  Resp: 18    Complications: No apparent anesthesia complications

## 2015-08-19 NOTE — Discharge Summary (Signed)
Physician Discharge Summary  Patient ID: Diana Lutz MRN: [id number] DOB/AGE: [birth year] 49 y.o.  Admit date: 08/18/2015 Discharge date: 08/19/2015  Admission Diagnoses: Cystocele, Rectocele, Stress Urinary incontinence  Discharge Diagnoses: Same. S/p Anterior Colporrhaphy, Posterior Colporrhaphy, Perineorrhaphy and TVT mid-urethral sling, cystoscopy.   Discharged Condition: good  Hospital Course: Uncomplicated surgery and post-op recovery. Stable vitals, pain well controlled. Vaginal packing removed on post-op day 1, voided well after foley removed with normal residual volume on bladder scan.   Discharge Exam: Blood pressure 103/58, pulse 52, temperature 97.8 F (36.6 C), temperature source Oral, resp. rate 18, height 5' 7.5" (1.715 m), weight 243 lb (110.224 kg), last menstrual period 08/16/2015, SpO2 98 %. Normal. No vaginal bleeding.   Disposition: Final discharge disposition not confirmed  Discharge Instructions    Call MD for:  difficulty breathing, headache or visual disturbances    Complete by:  As directed      Call MD for:  hives    Complete by:  As directed      Call MD for:  persistant dizziness or light-headedness    Complete by:  As directed      Call MD for:  persistant nausea and vomiting    Complete by:  As directed      Call MD for:  redness, tenderness, or signs of infection (pain, swelling, redness, odor or green/yellow discharge around incision site)    Complete by:  As directed      Call MD for:  severe uncontrolled pain    Complete by:  As directed      Call MD for:  temperature >100.4    Complete by:  As directed      Call MD for:    Complete by:  As directed   Heavy vaginal bleeding     Diet - low sodium heart healthy    Complete by:  As directed      Increase activity slowly    Complete by:  As directed      Lifting restrictions    Complete by:  As directed   20 pounds only for 6 weeks     Sexual Activity Restrictions    Complete by:  As  directed   Nothing in the vagina and no sex for 6 weeks            Medication List    TAKE these medications        acetaminophen 500 MG tablet  Commonly known as:  TYLENOL  Take 1,000 mg by mouth every 6 (six) hours as needed for moderate pain or headache.     calcium carbonate 500 MG chewable tablet  Commonly known as:  TUMS - dosed in mg elemental calcium  Chew 2 tablets by mouth daily as needed for indigestion or heartburn.     lisinopril 10 MG tablet  Commonly known as:  PRINIVIL,ZESTRIL  Take 1 tablet (10 mg total) by mouth daily.     omeprazole 20 MG capsule  Commonly known as:  PRILOSEC  Take 20 mg by mouth daily.     oxyCODONE-acetaminophen 5-325 MG tablet  Commonly known as:  PERCOCET/ROXICET  Take 1-2 tablets by mouth every 6 (six) hours as needed for severe pain (moderate to severe pain (when tolerating fluids)).     Vitamin D (Ergocalciferol) 50000 UNITS Caps capsule  Commonly known as:  DRISDOL  Take 1 capsule by mouth once weekly for a total of 12 weeks.  Follow-up Information    Follow up with Mckinsey Keagle R, MD In 2 weeks.   Specialty:  Obstetrics and Gynecology   Contact information:   [address] [place] [address] [phone]       Signed: Elveria Royals 08/19/2015, 8:36 AM

## 2015-08-19 NOTE — Discharge Instructions (Signed)
Anterior and Posterior Colporrhaphy, Sling Procedure, Care After Refer to this sheet in the next few weeks. These instructions provide you with information on caring for yourself after your procedure. Your health care provider may also give you more specific instructions. Your treatment has been planned according to current medical practices, but problems sometimes occur. Call your health care provider if you have any problems or questions after your procedure.  HOME CARE INSTRUCTIONS  Rest as much as possible during the first 2 weeks after the procedure.   Avoid heavy lifting (more than 10 pounds [4.5 kg]), pushing, or pulling. Limit stair climbing to once or twice a day the first week, then slowly increase this activity.   Avoid standing for prolonged periods of time.   Talk with your health care provider about when you may resume your usual physical activity.   You may resume your normal diet right away.   Drink at least 6-8 glasses of non-caffeinated beverages per day.   Eat a well-balanced diet. Daily portions of food from the meat (protein), milk, fruit, vegetable, and bread families are necessary for your health.   Your normal bowel function should return. If you become constipated, you may:   Take a mild laxative.  Add fruit and bran to your diet.  Drink more liquids.  You may take a shower and wash your hair.   Only take over-the-counter or prescription medicines as directed by your health care provider.   Clean the incision with water. Do not use a dressing unless the incision is draining or irritated. Check your incision daily for redness, draining, swelling, or separation of the skin.   Follow any bladder care instructions provided by your health care provider.   Keep your perineal area (the area between vagina and rectum) clean and dry. Perform perineal care after every bowel movement and each time you urinate. You may take a sitz bath or sit in a tub of  clean, warm water when necessary, unless your health care provider tells you otherwise.   Do not have sexual intercourse until permitted by your health care provider.   Follow up with your health care provider as directed.  SEEK MEDICAL CARE IF:  You have shaking chills.   Your pain is not relieved with medicine or becomes worse.  You have frequent or urgent urination, or you are unable to completely empty your bladder.   You feel a burning sensation when urinating.   You see pus coming from the wounds.  SEEK IMMEDIATE MEDICAL CARE IF:  You develop a fever.  You notice redness, drainage, swelling, or separation of the skin at the incision site.  You have difficulty breathing.  You are unable to urinate. MAKE SURE YOU:   Understand these instructions.  Will watch your condition.  Will get help right away if you are not doing well or get worse.   This information is not intended to replace advice given to you by your health care provider. Make sure you discuss any questions you have with your health care provider.   Document Released: 05/09/2004 Document Revised: 05/28/2013 Document Reviewed: 02/14/2013 Elsevier Interactive Patient Education [employer].

## 2015-08-19 NOTE — Addendum Note (Signed)
Addendum  created 08/19/15 0815 by Alvy Bimler, CRNA   Modules edited: Notes Section   Notes Section:  File: [id number]

## 2015-08-19 NOTE — Progress Notes (Signed)
Pt verbalizes understanding of d/c instructions, restrictions, medications, follow up appts, when to seek medical care and belongings policy. IV was d/c without complications. Pt has no questions at this time. Pt and husband ambulated to main entrance accompanied by [employer], [place]. Husband will drive pt home. Marry Guan

## 2015-08-25 ENCOUNTER — Other Ambulatory Visit: Payer: No Typology Code available for payment source

## 2015-08-31 ENCOUNTER — Encounter: Payer: No Typology Code available for payment source | Admitting: Primary Care

## 2015-12-07 ENCOUNTER — Other Ambulatory Visit: Payer: Self-pay | Admitting: Primary Care

## 2015-12-07 DIAGNOSIS — I10 Essential (primary) hypertension: Secondary | ICD-10-CM

## 2015-12-07 DIAGNOSIS — E785 Hyperlipidemia, unspecified: Secondary | ICD-10-CM

## 2015-12-07 DIAGNOSIS — E559 Vitamin D deficiency, unspecified: Secondary | ICD-10-CM

## 2015-12-07 DIAGNOSIS — D649 Anemia, unspecified: Secondary | ICD-10-CM

## 2015-12-08 ENCOUNTER — Other Ambulatory Visit: Payer: No Typology Code available for payment source

## 2015-12-08 ENCOUNTER — Other Ambulatory Visit (INDEPENDENT_AMBULATORY_CARE_PROVIDER_SITE_OTHER): Payer: No Typology Code available for payment source

## 2015-12-08 DIAGNOSIS — I10 Essential (primary) hypertension: Secondary | ICD-10-CM

## 2015-12-08 DIAGNOSIS — D649 Anemia, unspecified: Secondary | ICD-10-CM

## 2015-12-08 DIAGNOSIS — E559 Vitamin D deficiency, unspecified: Secondary | ICD-10-CM | POA: Diagnosis not present

## 2015-12-08 DIAGNOSIS — E785 Hyperlipidemia, unspecified: Secondary | ICD-10-CM | POA: Diagnosis not present

## 2015-12-08 LAB — CBC
HEMATOCRIT: 41.7 % (ref 36.0–46.0)
Hemoglobin: 14.4 g/dL (ref 12.0–15.0)
MCHC: 34.6 g/dL (ref 30.0–36.0)
MCV: 88.2 fl (ref 78.0–100.0)
Platelets: 204 10*3/uL (ref 150.0–400.0)
RBC: 4.73 Mil/uL (ref 3.87–5.11)
RDW: 13.7 % (ref 11.5–15.5)
WBC: 6.3 10*3/uL (ref 4.0–10.5)

## 2015-12-08 LAB — VITAMIN D 25 HYDROXY (VIT D DEFICIENCY, FRACTURES): VITD: 21.42 ng/mL — ABNORMAL LOW (ref 30.00–100.00)

## 2015-12-08 LAB — COMPREHENSIVE METABOLIC PANEL
ALT: 33 U/L (ref 0–35)
AST: 21 U/L (ref 0–37)
Albumin: 4.3 g/dL (ref 3.5–5.2)
Alkaline Phosphatase: 117 U/L (ref 39–117)
BUN: 11 mg/dL (ref 6–23)
CALCIUM: 9.6 mg/dL (ref 8.4–10.5)
CHLORIDE: 105 meq/L (ref 96–112)
CO2: 28 meq/L (ref 19–32)
CREATININE: 0.82 mg/dL (ref 0.40–1.20)
GFR: 78.01 mL/min (ref >60.00)
GLUCOSE: 101 mg/dL — AB (ref 70–99)
Potassium: 5 mEq/L (ref 3.5–5.1)
Sodium: 139 mEq/L (ref 135–145)
Total Bilirubin: 0.7 mg/dL (ref 0.2–1.2)
Total Protein: 7.2 g/dL (ref 6.0–8.3)

## 2015-12-08 LAB — LIPID PANEL
CHOL/HDL RATIO: 4
Cholesterol: 192 mg/dL (ref 0–200)
HDL: 46 mg/dL (ref >39.00)
LDL CALC: 119 mg/dL — AB (ref 0–99)
NONHDL: 145.88
TRIGLYCERIDES: 132 mg/dL (ref 0.0–149.0)
VLDL: 26.4 mg/dL (ref 0.0–40.0)

## 2015-12-15 ENCOUNTER — Ambulatory Visit (INDEPENDENT_AMBULATORY_CARE_PROVIDER_SITE_OTHER): Payer: No Typology Code available for payment source | Admitting: Primary Care

## 2015-12-15 ENCOUNTER — Encounter: Payer: Self-pay | Admitting: Primary Care

## 2015-12-15 VITALS — BP 122/84 | HR 91 | Temp 97.6°F | Ht 68.0 in | Wt 234.0 lb

## 2015-12-15 DIAGNOSIS — Z0000 Encounter for general adult medical examination without abnormal findings: Secondary | ICD-10-CM

## 2015-12-15 DIAGNOSIS — I10 Essential (primary) hypertension: Secondary | ICD-10-CM | POA: Diagnosis not present

## 2015-12-15 DIAGNOSIS — G4733 Obstructive sleep apnea (adult) (pediatric): Secondary | ICD-10-CM | POA: Diagnosis not present

## 2015-12-15 DIAGNOSIS — E559 Vitamin D deficiency, unspecified: Secondary | ICD-10-CM

## 2015-12-15 DIAGNOSIS — K219 Gastro-esophageal reflux disease without esophagitis: Secondary | ICD-10-CM | POA: Diagnosis not present

## 2015-12-15 DIAGNOSIS — Z1211 Encounter for screening for malignant neoplasm of colon: Secondary | ICD-10-CM | POA: Diagnosis not present

## 2015-12-15 MED ORDER — LISINOPRIL 10 MG PO TABS: 10.0000 mg | ORAL_TABLET | Freq: Every day | ORAL | Status: DC

## 2015-12-15 MED ORDER — OMEPRAZOLE 20 MG PO CPDR: 20.0000 mg | DELAYED_RELEASE_CAPSULE | Freq: Every day | ORAL | Status: DC

## 2015-12-15 NOTE — Progress Notes (Signed)
Subjective:    Patient ID: Diana Lutz, female    DOB: 1965/11/20, 50 y.o.   MRN: [id number]  HPI  Diana Lutz is a 50 year old female who presents today for complete physical.  Immunizations: -Tetanus: Completed within 5 years per patient. -Influenza: Completed in September 2016   Diet: Endorses a fair diet. Breakfast: Fruit, yogurt Lunch: Sandwich Dinner: Protein, vegetable, starch Snacks: None Desserts: None Beverages: Water (3 bottles of water daily), soda  Exercise: She is not currently exercising but is active at home. Eye exam: Due this spring. Dental exam: Completed yesterday Colonoscopy: Completed in 2006 at age 65. Pap Smear: Completed in September 2016 Mammogram: Completed in 2016    Review of Systems  Constitutional: Negative for unexpected weight change.  HENT: Negative for rhinorrhea.   Respiratory: Negative for cough and shortness of breath.   Cardiovascular: Negative for chest pain.  Gastrointestinal: Negative for diarrhea and constipation.  Genitourinary: Negative for difficulty urinating.  Musculoskeletal: Negative for myalgias and arthralgias.  Skin: Negative for rash.  Allergic/Immunologic: Positive for environmental allergies.  Neurological: Negative for dizziness, numbness and headaches.  Psychiatric/Behavioral:       Denies concerns for anxiety or depression       Past Medical History  Diagnosis Date  . Hypertension   . Kidney stone   . Abnormal Pap smear of cervix 1991  . Migraines   . Dysplasia of cervix     cervical ca  . GERD (gastroesophageal reflux disease)   . Anemia     history of anemia    Social History   Social History  . Marital Status: Married    Spouse Name: N/A  . Number of Children: N/A  . Years of Education: N/A   Occupational History  . Not on file.   Social History Main Topics  . Smoking status: Never Smoker   . Smokeless tobacco: Never Used  . Alcohol Use: 0.0 oz/week    0 Standard drinks  or equivalent per week     Comment: social  . Drug Use: No  . Sexual Activity: Not Currently   Other Topics Concern  . Not on file   Social History Narrative   Married.   3 children   Works for [employer] as Engineer, manufacturing systems.   Enjoys four wheeling, working on the house    Past Surgical History  Procedure Laterality Date  . Hysteroscopy w/ endometrial ablation    . Laser ablation of the cervix    . Wisdom tooth extraction    . Anterior and posterior repair N/A 08/18/2015    Procedure: ANTERIOR (CYSTOCELE) AND POSTERIOR REPAIR (RECTOCELE)/Perineorrhaphy;  Surgeon: Azucena Fallen, MD;  Location: [facility];  Service: Gynecology;  Laterality: N/A;  2 hrs.  . Bladder suspension N/A 08/18/2015    Procedure: TRANSVAGINAL TAPE (TVT) Mid-Urethral Sling PROCEDURE;  Surgeon: Azucena Fallen, MD;  Location: [facility];  Service: Gynecology;  Laterality: N/A;  . Cystoscopy N/A 08/18/2015    Procedure: CYSTOSCOPY;  Surgeon: Azucena Fallen, MD;  Location: [facility];  Service: Gynecology;  Laterality: N/A;    Family History  Problem Relation Age of Onset  . Hypertension Father   . Hypertension Mother   . Stroke Father   . Heart attack Father   . Prostate cancer Father   . Bladder Cancer Father   . Diverticulosis Mother   . Hypertension Brother   . Diabetes Brother     No Known Allergies  Current Outpatient Prescriptions on File Prior to  Visit  Medication Sig Dispense Refill  . acetaminophen (TYLENOL) 500 MG tablet Take 1,000 mg by mouth every 6 (six) hours as needed for moderate pain or headache.    . calcium carbonate (TUMS - DOSED IN MG ELEMENTAL CALCIUM) 500 MG chewable tablet Chew 2 tablets by mouth daily as needed for indigestion or heartburn.     No current facility-administered medications on file prior to visit.    BP 122/84 mmHg  Pulse 91  Temp(Src) 97.6 F (36.4 C) (Oral)  Ht 5\' 8"  (1.727 m)  Wt 234 lb (106.142 kg)  BMI 35.59 kg/m2  SpO2 97%    Objective:   Physical Exam    Constitutional: She is oriented to person, place, and time. She appears well-nourished.  HENT:  Right Ear: Tympanic membrane and ear canal normal.  Left Ear: Tympanic membrane and ear canal normal.  Nose: Nose normal.  Mouth/Throat: Oropharynx is clear and moist.  Eyes: Conjunctivae and EOM are normal. Pupils are equal, round, and reactive to light.  Neck: Neck supple. No thyromegaly present.  Cardiovascular: Normal rate and regular rhythm.   No murmur heard. Pulmonary/Chest: Effort normal and breath sounds normal. She has no rales.  Abdominal: Soft. Bowel sounds are normal. There is no tenderness.  Musculoskeletal: Normal range of motion.  Lymphadenopathy:    She has no cervical adenopathy.  Neurological: She is alert and oriented to person, place, and time. She has normal reflexes. No cranial nerve deficit.  Skin: Skin is warm and dry. No rash noted.  Psychiatric: She has a normal mood and affect.          Assessment & Plan:

## 2015-12-15 NOTE — Progress Notes (Signed)
Pre visit review using our clinic review tool, if applicable. No additional management support is needed unless otherwise documented below in the visit note. 

## 2015-12-15 NOTE — Assessment & Plan Note (Signed)
Level of 21 today, completed 50,000 unit capsule regimen. Will have her go to 2000 units once daily OTC. Repeat in 3 months.

## 2015-12-15 NOTE — Assessment & Plan Note (Signed)
Symptoms of snoring, frequent night awakenings, also with apnea during recent surgical procedure. Will send to pulmonary for formal sleep study.

## 2015-12-15 NOTE — Assessment & Plan Note (Signed)
Stable today. Continue lisinopril. Refills provided.

## 2015-12-15 NOTE — Assessment & Plan Note (Addendum)
Tdap and Flu UTD. Pap and mammogram UTD. Due for colonoscopy, referral placed. Discussed the importance of a healthy diet and regular exercise in order for weight loss and to reduce risk of other medical diseases. Increase consumption of water. Exam unremarkable. Labs stable.  Follow up in 1 year for repeat physical.

## 2015-12-15 NOTE — Patient Instructions (Addendum)
You will be contacted regarding your referral to GI for the colonoscopy and pulmonology for your sleep study.  Please let us know if you have not heard back within one week.   Start Vitamin D 2000 unit capsules everyday for low vitamin D levels. This may be purchased over the counter.  It is important that you improve your diet. Please limit carbohydrates in the form of white bread, rice, pasta, and sugary drinks, etc. Increase your consumption of fresh fruits and vegetables.  You need to consume about 64 ounces of water daily.  Start exercising. You should be getting 1 hour of moderate intensity exercise 5 days weekly.  Schedule a lab only appointment in 3 months for re-recheck of vitamin D levels.  Follow up in 1 year for repeat physical or sooner if needed.  It was a pleasure to see you today!

## 2016-02-11 ENCOUNTER — Encounter: Payer: Self-pay | Admitting: Primary Care

## 2016-02-22 ENCOUNTER — Encounter: Payer: Self-pay | Admitting: Gastroenterology

## 2016-04-24 ENCOUNTER — Encounter: Payer: No Typology Code available for payment source | Admitting: Gastroenterology

## 2016-05-09 ENCOUNTER — Encounter: Payer: Self-pay | Admitting: Pulmonary Disease

## 2016-05-09 ENCOUNTER — Ambulatory Visit (INDEPENDENT_AMBULATORY_CARE_PROVIDER_SITE_OTHER): Payer: No Typology Code available for payment source | Admitting: Pulmonary Disease

## 2016-05-09 VITALS — BP 130/90 | HR 78 | Ht 67.5 in | Wt 238.8 lb

## 2016-05-09 DIAGNOSIS — I10 Essential (primary) hypertension: Secondary | ICD-10-CM | POA: Diagnosis not present

## 2016-05-09 DIAGNOSIS — G4733 Obstructive sleep apnea (adult) (pediatric): Secondary | ICD-10-CM

## 2016-05-09 MED ORDER — LOSARTAN POTASSIUM 50 MG PO TABS: 50.0000 mg | ORAL_TABLET | Freq: Every day | ORAL | 0 refills | Status: DC

## 2016-05-09 NOTE — Assessment & Plan Note (Signed)
Given excessive daytime somnolence, narrow pharyngeal exam, witnessed apneas & loud snoring, obstructive sleep apnea is very likely & an overnight polysomnogram will be scheduled as a home study. The pathophysiology of obstructive sleep apnea , it's cardiovascular consequences & modes of treatment including CPAP were discused with the patient in detail & they evidenced understanding.  Pretest probability is high, she is a mouth breather and will likely need a full face mask CPAP required

## 2016-05-09 NOTE — Patient Instructions (Signed)
Home sleep study  Stop taking lisinopril Take losartan 50 mg once daily instead #30 Check your blood pressure on this new medication-call us for refills

## 2016-05-09 NOTE — Progress Notes (Signed)
Subjective:    Patient ID: Diana Lutz, female    DOB: [birth year], 50 y.o.   MRN: [id number]  HPI  Chief Complaint  Patient presents with  . Sleep Consult    Referred by Dr. Carlis Abbott; occasional apneas, snoring a lot, wakes herself up snoring, wakes up gasping for air, constantly tired; ES: 27    50 year old presents for evaluation of sleep-disordered breathing.  Her older brother who is overweight and had severe OSA, died in his sleep presumably due to sleep disordered breathing. She reports non-refreshing sleep and excessive daytime somnolence. Her husband and other family members have noted loud snoring, she has occasionally woken herself up with snoring and gasping episodes in her sleep, she feels tired all the time. She underwent a procedure under conscious sedation earlier in the year and was told by the anesthesiologist to have a sleep evaluation  Epworth sleepiness score is 20 and she report sleepiness and radius social situations such as sitting inactive in a public place, sitting and reading or watching TV, she does have some trouble driving but is able to keep herself awake, naps are not refreshing.  Bedtime is between 9 and 11 PM, sleep latency is minimal, she sleeps on her side with 2-3 pillows, reports 7-8 nocturnal awakenings due to dry throat, constant throat clearing occasional coughing and snoring. She reports a bathroom visit without any post void sleep latency and is out of bed by 6:30 AM feeling tired with dryness of mouth and occasional headache. She's gained about 15 pounds in the last 2 years.  Medication review shows lisinopril-she reports that her cough predates starting of this medication but has worsened on this she also reports constant throat clearing but daytime and causing nocturnal awakenings  Past Medical History:  Diagnosis Date  . Abnormal Pap smear of cervix 1991  . Anemia    history of anemia  . Dysplasia of cervix    cervical ca  . GERD  (gastroesophageal reflux disease)   . Hypertension   . Kidney stone   . Migraines      Past Surgical History:  Procedure Laterality Date  . ANTERIOR AND POSTERIOR REPAIR N/A 08/18/2015   Procedure: ANTERIOR (CYSTOCELE) AND POSTERIOR REPAIR (RECTOCELE)/Perineorrhaphy;  Surgeon: Azucena Fallen, MD;  Location: [facility];  Service: Gynecology;  Laterality: N/A;  2 hrs.  Marland Kitchen BLADDER SUSPENSION N/A 08/18/2015   Procedure: TRANSVAGINAL TAPE (TVT) Mid-Urethral Sling PROCEDURE;  Surgeon: Azucena Fallen, MD;  Location: [facility];  Service: Gynecology;  Laterality: N/A;  . CYSTOSCOPY N/A 08/18/2015   Procedure: CYSTOSCOPY;  Surgeon: Azucena Fallen, MD;  Location: [facility];  Service: Gynecology;  Laterality: N/A;  . HYSTEROSCOPY W/ ENDOMETRIAL ABLATION    . LASER ABLATION OF THE CERVIX    . WISDOM TOOTH EXTRACTION     No Known Allergies   Social History   Social History  . Marital status: Married    Spouse name: N/A  . Number of children: N/A  . Years of education: N/A   Occupational History  . Not on file.   Social History Main Topics  . Smoking status: Never Smoker  . Smokeless tobacco: Never Used  . Alcohol use 0.0 oz/week     Comment: social  . Drug use: No  . Sexual activity: Not Currently   Other Topics Concern  . Not on file   Social History Narrative   Married.   3 children   Works for [employer] as Engineer, manufacturing systems.   Enjoys four wheeling,  working on the house     Family History  Problem Relation Age of Onset  . Hypertension Father   . Hypertension Mother   . Stroke Father   . Heart attack Father   . Prostate cancer Father   . Bladder Cancer Father   . Diverticulosis Mother   . Hypertension Brother   . Diabetes Brother       Review of Systems  Constitutional: negative for anorexia, fevers and sweats  Eyes: negative for irritation, redness and visual disturbance  Ears, nose, mouth, throat, and face: negative for earaches, epistaxis, nasal congestion and sore throat   Respiratory: negative for cough, dyspnea on exertion, sputum and wheezing  Cardiovascular: negative for chest pain, dyspnea, lower extremity edema, orthopnea, palpitations and syncope  Gastrointestinal: negative for abdominal pain, constipation, diarrhea, melena, nausea and vomiting  Genitourinary:negative for dysuria, frequency and hematuria  Hematologic/lymphatic: negative for bleeding, easy bruising and lymphadenopathy  Musculoskeletal:negative for arthralgias, muscle weakness and stiff joints  Neurological: negative for coordination problems, gait problems, headaches   Endocrine: negative for diabetic symptoms including polydipsia, polyuria and weight loss     Objective:   Physical Exam  Gen. Pleasant, obese, in no distress ENT - no lesions, no post nasal drip Neck: No JVD, no thyromegaly, no carotid bruits Lungs: no use of accessory muscles, no dullness to percussion, decreased without rales or rhonchi  Cardiovascular: Rhythm regular, heart sounds  normal, no murmurs or gallops, no peripheral edema Musculoskeletal: No deformities, no cyanosis or clubbing , no tremors       Assessment & Plan:

## 2016-05-09 NOTE — Assessment & Plan Note (Signed)
Her cough and constant throat clearing at least be partially related to lisinopril  Stop taking lisinopril Take losartan 50 mg once daily instead #30 Check your blood pressure on this new medication-call us for refills

## 2016-05-24 DIAGNOSIS — G4733 Obstructive sleep apnea (adult) (pediatric): Secondary | ICD-10-CM

## 2016-06-01 ENCOUNTER — Other Ambulatory Visit: Payer: Self-pay | Admitting: *Deleted

## 2016-06-01 ENCOUNTER — Telehealth: Payer: Self-pay | Admitting: Pulmonary Disease

## 2016-06-01 DIAGNOSIS — G4733 Obstructive sleep apnea (adult) (pediatric): Secondary | ICD-10-CM

## 2016-06-01 NOTE — Telephone Encounter (Signed)
Per Dr. Elsworth Soho, Sleep Study shows AHI: 10/hr; Lowest Sat 80%.  Needs CPAP Titration Study.  -------------------------- Attempted to contact patient, left message for patient to return call.

## 2016-06-05 NOTE — Telephone Encounter (Signed)
[phone], pt cb

## 2016-06-05 NOTE — Telephone Encounter (Signed)
Attempted to contact patient, left message for patient to return call.

## 2016-06-05 NOTE — Telephone Encounter (Signed)
Spoke with pt and advised of sleep study results and need for CPAP titration study.  Pt verbalized understanding.  Order placed.

## 2016-06-05 NOTE — Telephone Encounter (Signed)
LMTC x 1  

## 2016-06-15 ENCOUNTER — Other Ambulatory Visit: Payer: Self-pay | Admitting: Pulmonary Disease

## 2016-06-23 ENCOUNTER — Ambulatory Visit (INDEPENDENT_AMBULATORY_CARE_PROVIDER_SITE_OTHER): Payer: No Typology Code available for payment source | Admitting: Primary Care

## 2016-06-23 ENCOUNTER — Ambulatory Visit (INDEPENDENT_AMBULATORY_CARE_PROVIDER_SITE_OTHER)
Admission: RE | Admit: 2016-06-23 | Discharge: 2016-06-23 | Disposition: A | Discharge status: Home / Self Care | Payer: No Typology Code available for payment source | Source: Ambulatory Visit | Attending: Primary Care | Admitting: Primary Care

## 2016-06-23 VITALS — BP 152/110 | HR 63 | Temp 98.1°F | Ht 67.5 in | Wt 234.8 lb

## 2016-06-23 DIAGNOSIS — M5442 Lumbago with sciatica, left side: Secondary | ICD-10-CM

## 2016-06-23 MED ORDER — PREDNISONE 10 MG PO TABS: ORAL_TABLET | ORAL | 0 refills | Status: DC

## 2016-06-23 NOTE — Progress Notes (Signed)
Subjective:    Patient ID: Diana Lutz, female    DOB: 12/08/1965, 50 y.o.   MRN: [id number]  HPI  Diana Lutz is a 50 year old female who presents today with a chief complaint of lower back and extremity pain. Her pain is located to left lower extremity which begins to her left lower back/buttocks and radiates down her left lower extremity to her knee. She describes her pain as achy pain with intermittently sharp sensations.   Her pain began 6 weeks ago. She has recently began a new job and is walking more than usual. Denies recent injury/trauma, numbness/tinlging, neck pain. She's taken ibuprofen 1200 mg twice daily with very little improvement.   Review of Systems  Genitourinary:       Denies bowel or bladder incontinence  Musculoskeletal: Positive for back pain.  Skin: Negative for color change.  Neurological: Negative for weakness and numbness.       Past Medical History:  Diagnosis Date  . Abnormal Pap smear of cervix 1991  . Anemia    history of anemia  . Dysplasia of cervix    cervical ca  . GERD (gastroesophageal reflux disease)   . Hypertension   . Kidney stone   . Migraines      Social History   Social History  . Marital status: Married    Spouse name: N/A  . Number of children: N/A  . Years of education: N/A   Occupational History  . Not on file.   Social History Main Topics  . Smoking status: Never Smoker  . Smokeless tobacco: Never Used  . Alcohol use 0.0 oz/week     Comment: social  . Drug use: No  . Sexual activity: Not Currently   Other Topics Concern  . Not on file   Social History Narrative   Married.   3 children   Works for [employer] as Engineer, manufacturing systems.   Enjoys four wheeling, working on the house    Past Surgical History:  Procedure Laterality Date  . ANTERIOR AND POSTERIOR REPAIR N/A 08/18/2015   Procedure: ANTERIOR (CYSTOCELE) AND POSTERIOR REPAIR (RECTOCELE)/Perineorrhaphy;  Surgeon: Azucena Fallen, MD;  Location:  [facility];  Service: Gynecology;  Laterality: N/A;  2 hrs.  Marland Kitchen BLADDER SUSPENSION N/A 08/18/2015   Procedure: TRANSVAGINAL TAPE (TVT) Mid-Urethral Sling PROCEDURE;  Surgeon: Azucena Fallen, MD;  Location: [facility];  Service: Gynecology;  Laterality: N/A;  . CYSTOSCOPY N/A 08/18/2015   Procedure: CYSTOSCOPY;  Surgeon: Azucena Fallen, MD;  Location: [facility];  Service: Gynecology;  Laterality: N/A;  . HYSTEROSCOPY W/ ENDOMETRIAL ABLATION    . LASER ABLATION OF THE CERVIX    . WISDOM TOOTH EXTRACTION      Family History  Problem Relation Age of Onset  . Hypertension Father   . Hypertension Mother   . Stroke Father   . Heart attack Father   . Prostate cancer Father   . Bladder Cancer Father   . Diverticulosis Mother   . Hypertension Brother   . Diabetes Brother     No Known Allergies  Current Outpatient Prescriptions on File Prior to Visit  Medication Sig Dispense Refill  . acetaminophen (TYLENOL) 500 MG tablet Take 1,000 mg by mouth every 6 (six) hours as needed for moderate pain or headache.    . calcium carbonate (TUMS - DOSED IN MG ELEMENTAL CALCIUM) 500 MG chewable tablet Chew 2 tablets by mouth daily as needed for indigestion or heartburn.    . losartan (COZAAR) 50  MG tablet TAKE 1 TABLET DAILY 30 tablet 0  . omeprazole (PRILOSEC) 20 MG capsule Take 1 capsule (20 mg total) by mouth daily. 90 capsule 3   No current facility-administered medications on file prior to visit.     BP (!) 152/110   Pulse 63   Temp 98.1 F (36.7 C) (Oral)   Ht 5' 7.5" (1.715 m)   Wt 234 lb 12.8 oz (106.5 kg)   SpO2 97%   BMI 36.23 kg/m    Objective:   Physical Exam  Constitutional: She appears well-nourished.  Cardiovascular: Normal rate and regular rhythm.   Pulmonary/Chest: Effort normal and breath sounds normal.  Musculoskeletal:       Lumbar back: She exhibits decreased range of motion and pain. She exhibits no tenderness.  Negative straight leg raise, however cannot fully extend left upper  extremity during exam due to discomfort.  Skin: Skin is warm and dry.          Assessment & Plan:  Low back/lower external pain:  Located to left lower back with radiation down left lower extremity through knee. No recent injury or trauma. Has started a new occupation is walking more than usual. Obvious mild to moderate decrease of range of motion in lower back and left lower extremity during examination. Will check lumbar plain films to ensure no loss of disc height. Prescription for prednisone taper provided for symptoms. Discussed to stop ibuprofen and use Tylenol as needed for breakthrough discomfort. Discussed application of ice/heat and to ensure she is wearing comfortable shoes while at work. If no improvement then we'll consider physical therapy.  Sheral Flow, NP

## 2016-06-23 NOTE — Patient Instructions (Signed)
Start prednisone tablets. Take three tablets for 3 days, then two tablets for 3 days, then one tablet for 3 days.  Apply heat/ice to your lower back three times daily for 20 minute intervals.  Do not take ibuprofen while on prednisone. You may take tylenol as needed.  Complete xray(s) prior to leaving today. I will notify you of your results once received.  It was a pleasure to see you today!

## 2016-06-23 NOTE — Progress Notes (Signed)
Pre visit review using our clinic review tool, if applicable. No additional management support is needed unless otherwise documented below in the visit note. 

## 2016-06-26 ENCOUNTER — Telehealth: Payer: Self-pay

## 2016-06-26 NOTE — Telephone Encounter (Signed)
Pt left v/m; pt was seen 06/23/16 and BP was elevated. Over the weekend BP ranged from 145/90 - 189/111. Pt wants to change BP med to something else. Can get triamterene,metoprolol and atenolol free from her company; if one of these meds are not appropriate pt said to order whatever [name] NP thinks needed. Pt request cb. [facility] [place].

## 2016-06-26 NOTE — Telephone Encounter (Signed)
Please notify patient that I would like for her to take 2 of her losartan tablets to equal 100 mg once daily. Please have her monitor her blood pressure as we'll call her later this week for her numbers. The medications listed from her company are not recommended as first-line treatment.

## 2016-06-27 ENCOUNTER — Telehealth: Payer: Self-pay | Admitting: Primary Care

## 2016-06-27 NOTE — Telephone Encounter (Signed)
Spoken and notified patient of Kate's comments. Patient verbalized understanding. Will call back with BP readings.

## 2016-06-27 NOTE — Telephone Encounter (Signed)
Pt called because she feels her BP meds arent working and would like to know if she needs a different med.  She said her BP is ranging between 140/90 - 189/111.  SHe has a list of meds that she can get for free at her [place] and any others should [facility] to [facility] [place].  Please call her to discuss.

## 2016-06-27 NOTE — Telephone Encounter (Signed)
Patient spoke with Diana Lutz who notified patient of my recommendations. Patient will take 100 mg of losartan and call back with BP readings in 1 week.

## 2016-06-27 NOTE — Telephone Encounter (Signed)
Message left for patient to return my call.  

## 2016-06-28 ENCOUNTER — Ambulatory Visit (INDEPENDENT_AMBULATORY_CARE_PROVIDER_SITE_OTHER): Payer: No Typology Code available for payment source | Admitting: Adult Health

## 2016-06-28 ENCOUNTER — Telehealth: Payer: Self-pay | Admitting: Primary Care

## 2016-06-28 VITALS — BP 132/88 | HR 73 | Temp 98.3°F | Ht 67.5 in | Wt 233.0 lb

## 2016-06-28 DIAGNOSIS — I10 Essential (primary) hypertension: Secondary | ICD-10-CM

## 2016-06-28 NOTE — Telephone Encounter (Signed)
Note sent

## 2016-06-28 NOTE — Telephone Encounter (Signed)
NotedTommi Lutz, please end me your note. Thanks.

## 2016-06-28 NOTE — Progress Notes (Signed)
   Subjective:    Patient ID: Diana Lutz, female    DOB: 08/20/1966, 50 y.o.   MRN: [id number]  HPI  50 year old female who  has a past medical history of Abnormal Pap smear of cervix (1991); Anemia; Dysplasia of cervix; GERD (gastroesophageal reflux disease); Hypertension; Kidney stone; and Migraines. She is a patient of Alma Friendly, NP. I am seeing her today for hypertension. Per patient she was recently switched from Lisinopril to Losartan due to a cough from the lisinopril. Since she was switched her blood pressures have been elevated over the last week. She reports blood pressures ranging between 145- 200/ 90- 111. She monitors her blood pressure at home and also endorses that the PA at her place of employement has also been monitoring her blood pressures. She denies that at any time she has experienced dizziness, lightheadedness, or blurred vision.   Today she had a blood pressure reading of 204/111  Per Epic notes, her PCP advised her two days ago to take 100mg  total of Losartan instead of 50 mg. This morning was the first times that the patient did this.  Today in the office her blood pressures were at or were near goal   1. 117/82 2. 132/88 3. 128/80  Review of Systems  Constitutional: Negative.   Eyes: Negative.   Respiratory: Negative.   Cardiovascular: Negative.   Neurological: Negative.        Objective:   Physical Exam  Constitutional: She is oriented to person, place, and time. She appears well-developed and well-nourished. No distress.  Cardiovascular: Normal rate, regular rhythm, normal heart sounds and intact distal pulses.  Exam reveals no gallop and no friction rub.   No murmur heard. Pulmonary/Chest: Effort normal and breath sounds normal. No respiratory distress. She has no wheezes. She has no rales. She exhibits no tenderness.  Neurological: She is alert and oriented to person, place, and time.  Skin: Skin is warm and dry. No rash noted. She is not  diaphoretic. No erythema. No pallor.  Psychiatric: She has a normal mood and affect. Her behavior is normal. Judgment and thought content normal.  Nursing note and vitals reviewed.     Assessment & Plan:  1. Essential hypertension - Continue with Losartan 100mg  daily and continue to monitor BP throughout the day. Inform PCP of blood pressure readings  - She was educated on the correct way to use her blood pressure cuff - Follow up with PCP as needed  Dorothyann Peng, NP

## 2016-06-28 NOTE — Telephone Encounter (Signed)
Patient Name: Diana Lutz DOB: 05-12-1966 Initial Comment Caller states she is on a certain potassium and her blood pressure is still running high. She has now been taking a double dose of potassium and she wants to know how quickly it takes effect. Her current bp is 204/111. She is having slight pressure on her head and neck. Nurse Assessment Nurse: Vallery Sa, RN, Cathy Date/Time (Eastern Time): 06/28/2016 1:59:41 PM Confirm and document reason for call. If symptomatic, describe symptoms. You must click the next button to save text entered. ---Caller states her blood pressure was 204/111 about 30 minutes ago. She has pressure in her head and neck that she rates as a 1 on the 1 to 10 scale. No severe breathing difficulty. Alert and responsive. Has the patient traveled out of the country within the last 30 days? ---No Does the patient have any new or worsening symptoms? ---Yes Will a triage be completed? ---Yes Related visit to physician within the last 2 weeks? ---No Does the PT have any chronic conditions? (i.e. diabetes, asthma, etc.) ---Yes List chronic conditions. ---High Blood Pressure, Nerve pain in her leg (started Predinsone about 5 days ago) Is the patient pregnant or possibly pregnant? (Ask all females between the ages of 104-55) ---No Is this a behavioral health or substance abuse call? ---No Guidelines Guideline Title Affirmed Question Affirmed Notes High Blood Pressure [1] BP # 200/120 AND [2] having NO cardiac or neurologic symptoms Final Disposition User See Physician within 4 Hours (or PCP triage) Vallery Sa, RN, Cathy Comments Scheduled for 4:30pm appointment today with Nafzinger, NP. Referrals REFERRED TO PCP OFFICE Disagree/Comply: Comply Call disconnected before it could be completely closed.

## 2016-06-28 NOTE — Telephone Encounter (Signed)
After receiving [facility] report on patient, this writer calls to confirm immediate evaluation.  I spoke directly with patient who is at [facility] waiting now to see NP Gracy Bruins, appointment is at 4:15 to evaluate for bp of 204/111 with symptoms of head and neck discomfort.  This appointment appears to have been made directly by [facility].   Will copy this to primary: Alma Friendly, NP.

## 2016-06-28 NOTE — Progress Notes (Signed)
Pre visit review using our clinic review tool, if applicable. No additional management support is needed unless otherwise documented below in the visit note. 

## 2016-06-29 NOTE — Telephone Encounter (Signed)
Note reviewed. BP stable.

## 2016-07-14 ENCOUNTER — Other Ambulatory Visit: Payer: Self-pay | Admitting: Primary Care

## 2016-07-14 DIAGNOSIS — Z1231 Encounter for screening mammogram for malignant neoplasm of breast: Secondary | ICD-10-CM

## 2016-07-17 ENCOUNTER — Other Ambulatory Visit: Payer: Self-pay | Admitting: Family Medicine

## 2016-07-18 LAB — CMP12+LP+TP+TSH+6AC+CBC/D/PLT
A/G RATIO: 2 (ref 1.2–2.2)
ALT: 63 IU/L — ABNORMAL HIGH (ref 0–32)
AST: 39 IU/L (ref 0–40)
Albumin: 4.5 g/dL (ref 3.5–5.5)
Alkaline Phosphatase: 107 IU/L (ref 39–117)
BASOS ABS: 0 10*3/uL (ref 0.0–0.2)
BUN/Creatinine Ratio: 23 (ref 9–23)
BUN: 13 mg/dL (ref 6–24)
Basos: 0 %
Bilirubin Total: 0.7 mg/dL (ref 0.0–1.2)
CHOLESTEROL TOTAL: 232 mg/dL — AB (ref 100–199)
CREATININE: 0.56 mg/dL — AB (ref 0.57–1.00)
Calcium: 9 mg/dL (ref 8.7–10.2)
Chloride: 101 mmol/L (ref 96–106)
Chol/HDL Ratio: 4.6 ratio units — ABNORMAL HIGH (ref 0.0–4.4)
EOS (ABSOLUTE): 0.1 10*3/uL (ref 0.0–0.4)
Eos: 2 %
Estimated CHD Risk: 1.2 times avg. — ABNORMAL HIGH (ref 0.0–1.0)
Free Thyroxine Index: 2 (ref 1.2–4.9)
GFR, EST AFRICAN AMERICAN: 125 mL/min/{1.73_m2} (ref >59)
GFR, EST NON AFRICAN AMERICAN: 108 mL/min/{1.73_m2} (ref >59)
GGT: 57 IU/L (ref 0–60)
GLOBULIN, TOTAL: 2.2 g/dL (ref 1.5–4.5)
Glucose: 108 mg/dL — ABNORMAL HIGH (ref 65–99)
HDL: 50 mg/dL (ref >39)
Hematocrit: 42 % (ref 34.0–46.6)
Hemoglobin: 14.3 g/dL (ref 11.1–15.9)
IRON: 94 ug/dL (ref 27–159)
Immature Grans (Abs): 0 10*3/uL (ref 0.0–0.1)
Immature Granulocytes: 0 %
LDH: 198 IU/L (ref 119–226)
LDL Calculated: 148 mg/dL — ABNORMAL HIGH (ref 0–99)
LYMPHS: 36 %
Lymphocytes Absolute: 1.7 10*3/uL (ref 0.7–3.1)
MCH: 31.2 pg (ref 26.6–33.0)
MCHC: 34 g/dL (ref 31.5–35.7)
MCV: 92 fL (ref 79–97)
MONOCYTES: 8 %
MONOS ABS: 0.4 10*3/uL (ref 0.1–0.9)
Neutrophils Absolute: 2.5 10*3/uL (ref 1.4–7.0)
Neutrophils: 54 %
PHOSPHORUS: 2.8 mg/dL (ref 2.5–4.5)
PLATELETS: 178 10*3/uL (ref 150–379)
Potassium: 4.3 mmol/L (ref 3.5–5.2)
RBC: 4.59 x10E6/uL (ref 3.77–5.28)
RDW: 14 % (ref 12.3–15.4)
Sodium: 138 mmol/L (ref 134–144)
T3 UPTAKE RATIO: 28 % (ref 24–39)
T4 TOTAL: 7.3 ug/dL (ref 4.5–12.0)
TOTAL PROTEIN: 6.7 g/dL (ref 6.0–8.5)
TRIGLYCERIDES: 172 mg/dL — AB (ref 0–149)
TSH: 1.37 u[IU]/mL (ref 0.450–4.500)
Uric Acid: 4.1 mg/dL (ref 2.5–7.1)
VLDL Cholesterol Cal: 34 mg/dL (ref 5–40)
WBC: 4.6 10*3/uL (ref 3.4–10.8)

## 2016-07-18 LAB — HGB A1C W/O EAG: HEMOGLOBIN A1C: 5.6 % (ref 4.8–5.6)

## 2016-07-18 LAB — VITAMIN D 25 HYDROXY (VIT D DEFICIENCY, FRACTURES): Vit D, 25-Hydroxy: 20.7 ng/mL — ABNORMAL LOW (ref 30.0–100.0)

## 2016-07-20 ENCOUNTER — Ambulatory Visit (HOSPITAL_BASED_OUTPATIENT_CLINIC_OR_DEPARTMENT_OTHER): Payer: No Typology Code available for payment source | Attending: Pulmonary Disease | Admitting: Pulmonary Disease

## 2016-07-20 VITALS — Ht 67.0 in | Wt 236.0 lb

## 2016-07-20 DIAGNOSIS — G4761 Periodic limb movement disorder: Secondary | ICD-10-CM | POA: Insufficient documentation

## 2016-07-20 DIAGNOSIS — G4733 Obstructive sleep apnea (adult) (pediatric): Secondary | ICD-10-CM

## 2016-07-28 ENCOUNTER — Telehealth: Payer: Self-pay | Admitting: Pulmonary Disease

## 2016-07-28 DIAGNOSIS — G4730 Sleep apnea, unspecified: Secondary | ICD-10-CM | POA: Diagnosis not present

## 2016-07-28 NOTE — Telephone Encounter (Signed)
Pl send Rx fotr CPAP therapy on 9 cm H2O with a Medium size Fisher&Paykel Full Face Mask Simplus mask and heated humidification.   OV in 6wks with download

## 2016-07-28 NOTE — Procedures (Signed)
Patient Name: Diana Lutz, Diana Lutz Date: 07/20/2016 Gender: Female D.O.B: September 29, 1966 Age (years): 77 Referring Provider: Kara Mead MD, ABSM Height (inches): 67 Interpreting Physician: Kara Mead MD, ABSM Weight (lbs): 236 RPSGT: Earney Hamburg BMI: 37 MRN: [id number] Neck Size: 16.00   CLINICAL INFORMATION The patient is referred for a CPAP titration to treat sleep apnea. Date of  HST: 05/2016 , AHI 10/h   SLEEP STUDY TECHNIQUE As per the AASM Manual for the Scoring of Sleep and Associated Events v2.3 (April 2016) with a hypopnea requiring 4% desaturations. The channels recorded and monitored were frontal, central and occipital EEG, electrooculogram (EOG), submentalis EMG (chin), nasal and oral airflow, thoracic and abdominal wall motion, anterior tibialis EMG, snore microphone, electrocardiogram, and pulse oximetry. Continuous positive airway pressure (CPAP) was initiated at the beginning of the study and titrated to treat sleep-disordered breathing.   RESPIRATORY PARAMETERS Optimal PAP Pressure (cm): 9 AHI at Optimal Pressure (/hr): 1.9 Overall Minimal O2 (%): 78.00 Supine % at Optimal Pressure (%): 28 Minimal O2 at Optimal Pressure (%): 78.0     SLEEP ARCHITECTURE The study was initiated at 9:38:46 PM and ended at 4:40:43 AM. Sleep onset time was 35.3 minutes and the sleep efficiency was 64.8%. The total sleep time was 273.5 minutes. The patient spent 4.02% of the night in stage N1 sleep, 81.17% in stage N2 sleep, 0.00% in stage N3 and 14.81% in REM.Stage REM latency was 63.0 minutes Wake after sleep onset was 113.1. Alpha intrusion was absent. Supine sleep was 30.16%.   CARDIAC DATA The 2 lead EKG demonstrated sinus rhythm. The mean heart rate was 67.82 beats per minute. Other EKG findings include: None.   LEG MOVEMENT DATA The total Periodic Limb Movements of Sleep (PLMS) were 112. The PLMS index was 24.57. A PLMS index of <15 is considered normal in  adults.    IMPRESSIONS - The optimal PAP pressure was 9 cm of water. - Central sleep apnea was not noted during this titration (CAI = 0.0/h). - Severe oxygen desaturations were observed during this titration (min O2 = 78.00%). - The patient snored with Soft snoring volume during this titration study. - No cardiac abnormalities were observed during this study. - Mild periodic limb movements were observed during this study. Arousals associated with PLMs were rare.   DIAGNOSIS - Obstructive Sleep Apnea (327.23 [G47.33 ICD-10])    RECOMMENDATIONS - Trial of CPAP therapy on 9 cm H2O with a Medium size Fisher&Paykel Full Face Mask Simplus mask and heated humidification. - Avoid alcohol, sedatives and other CNS depressants that may worsen sleep apnea and disrupt normal sleep architecture. - Sleep hygiene should be reviewed to assess factors that may improve sleep quality. - Weight management and regular exercise should be initiated or continued. - Return to [facility] for re-evaluation after 4 weeks of therapy    Kara Mead MD Board Certified in [place]

## 2016-07-31 ENCOUNTER — Ambulatory Visit
Admission: RE | Admit: 2016-07-31 | Discharge: 2016-07-31 | Disposition: A | Discharge status: Home / Self Care | Payer: No Typology Code available for payment source | Source: Ambulatory Visit | Attending: Primary Care | Admitting: Primary Care

## 2016-07-31 DIAGNOSIS — Z1231 Encounter for screening mammogram for malignant neoplasm of breast: Secondary | ICD-10-CM | POA: Insufficient documentation

## 2016-08-07 ENCOUNTER — Other Ambulatory Visit: Payer: Self-pay | Admitting: Pulmonary Disease

## 2016-08-07 NOTE — Telephone Encounter (Signed)
LMTC x 1  

## 2016-08-11 NOTE — Telephone Encounter (Signed)
lmtcb for pt.  

## 2016-08-17 NOTE — Telephone Encounter (Signed)
Lmtcb. Will await call back 

## 2016-09-01 NOTE — Telephone Encounter (Signed)
LMTCB

## 2016-09-20 NOTE — Telephone Encounter (Signed)
lmtcb X4 for pt. Letter sent. Will close encounter per triage protocol.

## 2016-09-25 ENCOUNTER — Other Ambulatory Visit: Payer: Self-pay

## 2016-09-25 MED ORDER — LOSARTAN POTASSIUM 50 MG PO TABS: 50.0000 mg | ORAL_TABLET | Freq: Every day | ORAL | 1 refills | Status: DC

## 2016-10-05 ENCOUNTER — Telehealth: Payer: Self-pay | Admitting: Pulmonary Disease

## 2016-10-05 NOTE — Telephone Encounter (Signed)
Spoke with pt and advised of sleep study results per Dr Elsworth Soho and need to start cpap therapy (see phone note 07/28/16).  Pt wants to check with her insurance company to see what DME is covered and will call us back with this info.  Will await pt call back.

## 2016-10-10 NOTE — Telephone Encounter (Signed)
Waiting on call back from pt about what DME is covered by her insurance.

## 2016-10-13 NOTE — Telephone Encounter (Signed)
Attempted to contact pt. No answer, no option to leave a message. Will try back.  

## 2016-10-16 NOTE — Telephone Encounter (Signed)
Attempted to contact pt. No answer, no option to leave a message. Will try back.  

## 2016-10-17 NOTE — Telephone Encounter (Signed)
lmtcb x1 for pt. 

## 2016-10-18 NOTE — Telephone Encounter (Signed)
LM x 2

## 2016-10-19 NOTE — Telephone Encounter (Signed)
She can check online at CPAP websites where she can find a machine cheaper and we can provide prescription  CPAP therapy on 9 cm H2O with a Medium size Fisher&Paykel Full Face Mask Simplus mask and heated humidification

## 2016-10-19 NOTE — Telephone Encounter (Signed)
Called and spoke to pt. Pt states she called her insurance company to see what DME company is covered but they are requiring pt to meet her $1500 deductible, pt does not want to do this. Pt is requesting if she can get the CPAP offline. Pt is requesting recs from [facility].   Dr. Elsworth Soho please advise. Thanks.

## 2016-10-19 NOTE — Telephone Encounter (Signed)
Spoke with pt. She is aware of [facility]'s response. States that she will do some research and call us back for the prescription. Nothing further was needed at this time.

## 2016-11-17 ENCOUNTER — Ambulatory Visit: Payer: Self-pay | Admitting: *Deleted

## 2016-11-17 DIAGNOSIS — Z23 Encounter for immunization: Secondary | ICD-10-CM

## 2016-11-24 ENCOUNTER — Telehealth: Payer: Self-pay | Admitting: Pulmonary Disease

## 2016-11-24 DIAGNOSIS — G4733 Obstructive sleep apnea (adult) (pediatric): Secondary | ICD-10-CM

## 2016-11-24 NOTE — Telephone Encounter (Signed)
Spoke with pt. She would like to have a prescription for CPAP machine that [facility] wanted her to have back in 10/05/16. This prescription has been faxed to her at [phone]. Nothing further was needed at this time.

## 2017-01-11 ENCOUNTER — Ambulatory Visit: Payer: Self-pay | Admitting: Registered Nurse

## 2017-01-11 VITALS — BP 136/84 | HR 73 | Temp 98.0°F

## 2017-01-11 DIAGNOSIS — H6593 Unspecified nonsuppurative otitis media, bilateral: Secondary | ICD-10-CM

## 2017-01-11 DIAGNOSIS — J0101 Acute recurrent maxillary sinusitis: Secondary | ICD-10-CM

## 2017-01-11 DIAGNOSIS — J301 Allergic rhinitis due to pollen: Secondary | ICD-10-CM

## 2017-01-11 MED ORDER — FLUTICASONE PROPIONATE 50 MCG/ACT NA SUSP: 1.0000 | Freq: Two times a day (BID) | NASAL | 0 refills | Status: DC

## 2017-01-11 MED ORDER — SALINE SPRAY 0.65 % NA SOLN: 2.0000 | NASAL | 0 refills | Status: DC

## 2017-01-11 MED ORDER — AMOXICILLIN 875 MG PO TABS: 875.0000 mg | ORAL_TABLET | Freq: Two times a day (BID) | ORAL | 0 refills | Status: DC

## 2017-01-11 NOTE — Patient Instructions (Addendum)
Restart flonase 1 spray each nostril twice a day Nasal saline 2 sprays each nostril every 2 hours while awake for congestion Aggressive nasal saline use in the shower twice a day Start amoxicillin 875mg  by mouth twice a day for 10 days  Nasal Allergies Nasal allergies are a reaction to allergens in the air. Allergens are parti Sinus Rinse What is a sinus rinse? A sinus rinse is a home treatment. It rinses your sinuses with a mixture of salt and water (saline solution). Sinuses are air-filled spaces in your skull behind the bones of your face and forehead. They open into your nasal cavity. To do a sinus rinse, you will need: Saline solution. Neti pot or spray bottle. This releases the saline solution into your nose and through your sinuses. You can buy neti pots and spray bottles at: Your local pharmacy. A health food store. Online. When should I do a sinus rinse? A sinus rinse can help to clear your nasal cavity. It can clear: Mucus. Dirt. Dust. Pollen. You may do a sinus rinse when you have: A cold. A virus. Allergies. A sinus infection. A stuffy nose. If you are considering a sinus rinse: Ask your child's doctor before doing a sinus rinse on your child. Do not do a sinus rinse if you have had: Ear or nasal surgery. An ear infection. Blocked ears. How do I do a sinus rinse? Wash your hands. Disinfect your device using the directions that came with the device. Dry your device. Use the solution that comes with your device or one that is sold separately in stores. Follow the mixing directions on the package. Fill your device with the amount of saline solution as stated in the device instructions. Stand over a sink and tilt your head sideways over the sink. Place the spout of the device in your upper nostril (the one closer to the ceiling). Gently pour or squeeze the saline solution into the nasal cavity. The liquid should drain to the lower nostril if you are not too  congested. Gently blow your nose. Blowing too hard may cause ear pain. Repeat in the other nostril. Clean and rinse your device with clean water. Air-dry your device. Are there risks of a sinus rinse? Sinus rinse is normally very safe and helpful. However, there are a few risks, which include: A burning feeling in the sinuses. This may happen if you do not make the saline solution as instructed. Make sure to follow all directions when making the saline solution. Infection from unclean water. This is rare, but possible. Nasal irritation. This information is not intended to replace advice given to you by your health care provider. Make sure you discuss any questions you have with your health care provider. Document Released: 04/22/2014 Document Revised: 08/22/2016 Document Reviewed: 02/10/2014 Elsevier Interactive Patient Education  2017 [place] in the air that cause your body to have an allergic reaction. Nasal allergies are not passed from person to person (are not contagious). They cannot be cured, but they can be controlled. What are the causes? Seasonal nasal allergies (hay fever) are caused by pollen allergens that come from grasses, trees, and weeds. Year-round nasal allergies (perennial allergic rhinitis) are caused by allergens such as house dust mites, pet dander, and mold spores. What increases the risk? The following factors may make you more likely to develop this condition:  Having certain health conditions. These include:  Other types of allergies, such as food allergies.  Asthma.  Eczema.  Having a  close relative who has allergies or asthma.  Exposure to house dust, pollen, dander, or other allergens at home or at work.  Exposure to air pollution or secondhand smoke when you were a child. What are the signs or symptoms? Symptoms of this condition include:  Sneezing.  Runny nose or stuffy nose (congestion).  Watery (tearing) eyes.  Itchy eyes, nose,  mouth, throat, skin, or other area.  Sore throat.  Headache.  Decreased sense of smell or taste.  Fatigue. This may occur if you have trouble sleeping due to allergies.  Swollen eyelids. How is this diagnosed? This condition is diagnosed with a medical history and physical exam. Allergy testing may be done to determine exactly what triggers your nasal allergies. How is this treated? There is no cure for nasal allergies. Treatment focuses on controlling your symptoms, and it may include:  Medicines that block allergy symptoms. These may include allergy shots, nasal sprays, and oral antihistamines.  Avoiding the allergen. Follow these instructions at home:  Avoid the allergen that is causing your symptoms, if possible.  Keep windows closed. If possible, use air conditioning when pollen counts are high.  Do not use fans in your home.  Do not hang clothes outside to dry.  Wear sunglasses to keep pollen out of your eyes.  Wash your hands right away after you touch household pets.  Take over-the-counter and prescription medicines only as told by your health care provider.  Keep all follow-up visits as told by your health care provider. This is important. Contact a health care provider if:  You have a fever.  You develop a cough that does not go away (is persistent).  You start to wheeze.  Your symptoms do not improve with treatment.  You have thick nasal discharge.  You start to have nosebleeds. Get help right away if:  Your tongue or your lips are swollen.  You have trouble breathing.  You feel light-headed or you feel like you are going to faint.  You have cold sweats. This information is not intended to replace advice given to you by your health care provider. Make sure you discuss any questions you have with your health care provider. Document Released: 09/25/2005 Document Revised: 02/28/2016 Document Reviewed: 04/07/2015 Elsevier Interactive Patient  Education  2017 [employer].   Otitis Media With Effusion, Pediatric Otitis media with effusion (OME) occurs when there is inflammation of the middle ear and fluid in the middle ear space. There are no signs and symptoms of infection. The middle ear space contains air and the bones for hearing. Air in the middle ear space helps to transmit sound to the brain. OME is a common condition in children, and it often occurs after an ear infection. This condition may be present for several weeks or longer after an ear infection. Most cases of this condition get better on their own. What are the causes? OME is caused by a blockage of the eustachian tube in one or both ears. These tubes drain fluid in the ears to the back of the nose (nasopharynx). If the tissue in the tube swells up (edema), the tube closes. This prevents fluid from draining. Blockage can be caused by:  Ear infections.  Colds and other upper respiratory infections.  Allergies.  Irritants, such as tobacco smoke.  Enlarged adenoids. The adenoids are areas of soft tissue located high in the back of the throat, behind the nose and the roof of the mouth. They are part of the body's natural  defense (immune) system.  A mass in the nasopharynx.  Damage to the ear caused by pressure changes (barotrauma). What increases the risk? Your child is more likely to develop this condition if:  He or she has repeated ear and sinus infections.  He or she has allergies.  He or she is exposed to tobacco smoke.  He or she attends daycare.  He or she is not breastfed. What are the signs or symptoms? Symptoms of this condition may not be obvious. Sometimes this condition does not have any symptoms, or symptoms may overlap with those of a cold or upper respiratory tract illness. Symptoms of this condition include:  Temporary hearing loss.  A feeling of fullness in the ear without pain.  Irritability or agitation.  Balance (vestibular)  problems. As a result of hearing loss, your child may:  Listen to the TV at a loud volume.  Not respond to questions.  Ask "What?" often when spoken to.  Mistake or confuse one sound or word for another.  Perform poorly at school.  Have a poor attention span.  Become agitated or irritated easily. How is this diagnosed? This condition is diagnosed with an ear exam. Your child's health care provider will look inside your child's ear with an instrument (otoscope) to check for redness, swelling, and fluid. Other tests may be done, including:  A test to check the movement of the eardrum (pneumatic otoscopy). This is done by squeezing a small amount of air into the ear.  A test that changes air pressure in the middle ear to check how well the eardrum moves and to see if the eustachian tube is working (tympanogram).  Hearing test (audiogram). This test involves playing tones at different pitches to see if your child can hear each tone. How is this treated? Treatment for this condition depends on the cause. In many cases, the fluid goes away on its own. In some cases, your child may need a procedure to create a hole in the eardrum to allow fluid to drain (myringotomy) and to insert small drainage tubes (tympanostomy tubes) into the eardrums. These tubes help to drain fluid and prevent infection. This procedure may be recommended if:  OME does not get better over several months.  Your child has many ear infections within several months.  Your child has noticeable hearing loss.  Your child has problems with speech and language development. Surgery may also be done to remove the adenoids (adenoidectomy). Follow these instructions at home:  Give over-the-counter and prescription medicines only as told by your child's health care provider.  Keep children away from any tobacco smoke.  Keep all follow-up visits as told by your child's health care provider. This is important. How is this  prevented?  Keep your child's vaccinations up to date. Make sure your child gets all recommended vaccinations, including a pneumonia and flu vaccine.  Encourage hand washing. Your child should wash his or her hands often with soap and water. If there is no soap and water, he or she should use hand sanitizer.  Avoid exposing your child to tobacco smoke.  Breastfeed your baby, if possible. Babies who are breastfed as long as possible are less likely to develop this condition. Contact a health care provider if:  Your child's hearing does not get better after 3 months.  Your child's hearing is worse.  Your child has ear pain.  Your child has a fever.  Your child has drainage from the ear.  Your child is  dizzy.  Your child has a lump on his or her neck. Get help right away if:  Your child has bleeding from the nose.  Your child cannot move part of her or his face.  Your child has trouble breathing.  Your child cannot smell.  Your child develops severe congestion.  Your child develops weakness.  Your child who is younger than 3 months has a temperature of 100F (38C) or higher. Summary  Otitis media with effusion (OME) occurs when there is inflammation of the middle ear and fluid in the middle ear space.  This condition is caused by blockage of one or both eustachian tubes, which drain fluid in the ears to the back of the nose.  Symptoms of this condition can include temporary hearing loss, a feeling of fullness in the ear, irritability or agitation, and balance (vestibular) problems. Sometimes, there are no symptoms.  This condition is diagnosed with an ear exam and tests, such as pneumatic otoscopy, tympanogram, and audiogram.  Treatment for this condition depends on the cause. In many cases, the fluid goes away on its own. This information is not intended to replace advice given to you by your health care provider. Make sure you discuss any questions you have with your  health care provider. Document Released: 12/16/2003 Document Revised: 08/17/2016 Document Reviewed: 08/17/2016 Elsevier Interactive Patient Education  2017 [employer]  Sinusitis, Adult Sinusitis is soreness and inflammation of your sinuses. Sinuses are hollow spaces in the bones around your face. Your sinuses are located:  Around your eyes.  In the middle of your forehead.  Behind your nose.  In your cheekbones. Your sinuses and nasal passages are lined with a stringy fluid (mucus). Mucus normally drains out of your sinuses. When your nasal tissues become inflamed or swollen, the mucus can become trapped or blocked so air cannot flow through your sinuses. This allows bacteria, viruses, and funguses to grow, which leads to infection. Sinusitis can develop quickly and last for 7?10 days (acute) or for more than 12 weeks (chronic). Sinusitis often develops after a cold. What are the causes? This condition is caused by anything that creates swelling in the sinuses or stops mucus from draining, including:  Allergies.  Asthma.  Bacterial or viral infection.  Abnormally shaped bones between the nasal passages.  Nasal growths that contain mucus (nasal polyps).  Narrow sinus openings.  Pollutants, such as chemicals or irritants in the air.  A foreign object stuck in the nose.  A fungal infection. This is rare. What increases the risk? The following factors may make you more likely to develop this condition:  Having allergies or asthma.  Having had a recent cold or respiratory tract infection.  Having structural deformities or blockages in your nose or sinuses.  Having a weak immune system.  Doing a lot of swimming or diving.  Overusing nasal sprays.  Smoking. What are the signs or symptoms? The main symptoms of this condition are pain and a feeling of pressure around the affected sinuses. Other symptoms include:  Upper toothache.  Earache.  Headache.  Bad  breath.  Decreased sense of smell and taste.  A cough that may get worse at night.  Fatigue.  Fever.  Thick drainage from your nose. The drainage is often green and it may contain pus (purulent).  Stuffy nose or congestion.  Postnasal drip. This is when extra mucus collects in the throat or back of the nose.  Swelling and warmth over the affected sinuses.  Sore  throat.  Sensitivity to light. How is this diagnosed? This condition is diagnosed based on symptoms, a medical history, and a physical exam. To find out if your condition is acute or chronic, your health care provider may:  Look in your nose for signs of nasal polyps.  Tap over the affected sinus to check for signs of infection.  View the inside of your sinuses using an imaging device that has a light attached (endoscope). If your health care provider suspects that you have chronic sinusitis, you may also:  Be tested for allergies.  Have a sample of mucus taken from your nose (nasal culture) and checked for bacteria.  Have a mucus sample examined to see if your sinusitis is related to an allergy. If your sinusitis does not respond to treatment and it lasts longer than 8 weeks, you may have an MRI or CT scan to check your sinuses. These scans also help to determine how severe your infection is. In rare cases, a bone biopsy may be done to rule out more serious types of fungal sinus disease. How is this treated? Treatment for sinusitis depends on the cause and whether your condition is chronic or acute. If a virus is causing your sinusitis, your symptoms will go away on their own within 10 days. You may be given medicines to relieve your symptoms, including:  Topical nasal decongestants. They shrink swollen nasal passages and let mucus drain from your sinuses.  Antihistamines. These drugs block inflammation that is triggered by allergies. This can help to ease swelling in your nose and sinuses.  Topical nasal  corticosteroids. These are nasal sprays that ease inflammation and swelling in your nose and sinuses.  Nasal saline washes. These rinses can help to get rid of thick mucus in your nose. If your condition is caused by bacteria, you will be given an antibiotic medicine. If your condition is caused by a fungus, you will be given an antifungal medicine. Surgery may be needed to correct underlying conditions, such as narrow nasal passages. Surgery may also be needed to remove polyps. Follow these instructions at home: Medicines   Take, use, or apply over-the-counter and prescription medicines only as told by your health care provider. These may include nasal sprays.  If you were prescribed an antibiotic medicine, take it as told by your health care provider. Do not stop taking the antibiotic even if you start to feel better. Hydrate and Humidify   Drink enough water to keep your urine clear or pale yellow. Staying hydrated will help to thin your mucus.  Use a cool mist humidifier to keep the humidity level in your home above 50%.  Inhale steam for 10-15 minutes, 3-4 times a day or as told by your health care provider. You can do this in the bathroom while a hot shower is running.  Limit your exposure to cool or dry air. Rest   Rest as much as possible.  Sleep with your head raised (elevated).  Make sure to get enough sleep each night. General instructions   Apply a warm, moist washcloth to your face 3-4 times a day or as told by your health care provider. This will help with discomfort.  Wash your hands often with soap and water to reduce your exposure to viruses and other germs. If soap and water are not available, use hand sanitizer.  Do not smoke. Avoid being around people who are smoking (secondhand smoke).  Keep all follow-up visits as told by your health care  provider. This is important. Contact a health care provider if:  You have a fever.  Your symptoms get worse.  Your  symptoms do not improve within 10 days. Get help right away if:  You have a severe headache.  You have persistent vomiting.  You have pain or swelling around your face or eyes.  You have vision problems.  You develop confusion.  Your neck is stiff.  You have trouble breathing. This information is not intended to replace advice given to you by your health care provider. Make sure you discuss any questions you have with your health care provider. Document Released: 09/25/2005 Document Revised: 05/21/2016 Document Reviewed: 07/21/2015 Elsevier Interactive Patient Education  2017 [employer].

## 2017-01-11 NOTE — Progress Notes (Signed)
Subjective:    Patient ID: Diana Lutz, female    DOB: 06/07/1966, 51 y.o.   MRN: [id number]  50y/o caucasian female reports off and on sinus pain/pressure for past couple of weeks. Has worsened over last few days. Frontal and maxillary pressure. Causing upper teeth pain, HA with light sensitivity. Denies cough, chest congestion, ear pain/pressure/drainage, sore throat. Has not used any OTC for treatment. Not on any daily allergy medication. PMHx seasonal allergies last sinus infection Dec 2017 amoxicillin and flonase worked well.      Review of Systems  Constitutional: Negative for activity change, appetite change, chills, diaphoresis, fatigue, fever and unexpected weight change.  HENT: Positive for congestion, postnasal drip, sinus pain and sinus pressure. Negative for dental problem, drooling, ear discharge, ear pain, facial swelling, hearing loss, mouth sores, nosebleeds, rhinorrhea, sneezing, sore throat, tinnitus, trouble swallowing and voice change.   Eyes: Positive for photophobia. Negative for pain, discharge, redness, itching and visual disturbance.  Respiratory: Negative for cough, choking, chest tightness, shortness of breath, wheezing and stridor.   Cardiovascular: Negative for chest pain, palpitations and leg swelling.  Gastrointestinal: Negative for abdominal distention, abdominal pain, blood in stool, constipation, diarrhea, nausea and vomiting.  Endocrine: Negative for cold intolerance and heat intolerance.  Genitourinary: Negative for difficulty urinating, dysuria and hematuria.  Musculoskeletal: Negative for arthralgias, back pain, gait problem, joint swelling, myalgias, neck pain and neck stiffness.  Skin: Negative for color change, pallor, rash and wound.  Allergic/Immunologic: Positive for environmental allergies. Negative for food allergies.  Neurological: Positive for headaches. Negative for dizziness, tremors, seizures, syncope, facial asymmetry, speech  difficulty, weakness, light-headedness and numbness.  Hematological: Negative for adenopathy. Does not bruise/bleed easily.  Psychiatric/Behavioral: Negative for agitation, behavioral problems, confusion and sleep disturbance.       Objective:   Physical Exam  Constitutional: She is oriented to person, place, and time. Vital signs are normal. She appears well-developed and well-nourished. She is active and cooperative.  Non-toxic appearance. She does not have a sickly appearance. She appears ill. No distress.  HENT:  Head: Normocephalic and atraumatic.  Right Ear: Hearing, external ear and ear canal normal. A middle ear effusion is present.  Left Ear: Hearing, external ear and ear canal normal. A middle ear effusion is present.  Nose: Mucosal edema and rhinorrhea present. No nose lacerations, sinus tenderness, nasal deformity, septal deviation or nasal septal hematoma. No epistaxis.  No foreign bodies. Right sinus exhibits maxillary sinus tenderness and frontal sinus tenderness. Left sinus exhibits no maxillary sinus tenderness and no frontal sinus tenderness.  Mouth/Throat: Uvula is midline and mucous membranes are normal. Mucous membranes are not pale, not dry and not cyanotic. She does not have dentures. No oral lesions. No trismus in the jaw. Normal dentition. No dental abscesses, uvula swelling, lacerations or dental caries. Posterior oropharyngeal edema and posterior oropharyngeal erythema present. No oropharyngeal exudate or tonsillar abscesses.  Eyes: Conjunctivae, EOM and lids are normal. Pupils are equal, round, and reactive to light. Right eye exhibits no chemosis, no discharge, no exudate and no hordeolum. No foreign body present in the right eye. Left eye exhibits no chemosis, no discharge, no exudate and no hordeolum. No foreign body present in the left eye. Right conjunctiva is not injected. Right conjunctiva has no hemorrhage. Left conjunctiva is not injected. Left conjunctiva has no  hemorrhage. No scleral icterus. Right eye exhibits normal extraocular motion and no nystagmus. Left eye exhibits normal extraocular motion and no nystagmus. Right pupil is round and  reactive. Left pupil is round and reactive. Pupils are equal.  Neck: Trachea normal and normal range of motion. Neck supple. No tracheal tenderness, no spinous process tenderness and no muscular tenderness present. No neck rigidity. No tracheal deviation, no edema, no erythema and normal range of motion present. No thyroid mass and no thyromegaly present.  Cardiovascular: Normal rate, regular rhythm, S1 normal, S2 normal, normal heart sounds and intact distal pulses.  PMI is not displaced.  Exam reveals no gallop and no friction rub.   No murmur heard. Pulmonary/Chest: Effort normal and breath sounds normal. No accessory muscle usage or stridor. No respiratory distress. She has no decreased breath sounds. She has no wheezes. She has no rhonchi. She has no rales. She exhibits no tenderness.  Abdominal: Soft. She exhibits no distension.  Musculoskeletal: Normal range of motion. She exhibits no edema or tenderness.       Right shoulder: Normal.       Left shoulder: Normal.       Right hip: Normal.       Left hip: Normal.       Right knee: Normal.       Left knee: Normal.       Cervical back: Normal.       Right hand: Normal.       Left hand: Normal.  Lymphadenopathy:       Head (right side): No submental, no submandibular, no tonsillar, no preauricular, no posterior auricular and no occipital adenopathy present.       Head (left side): No submental, no submandibular, no tonsillar, no preauricular, no posterior auricular and no occipital adenopathy present.    She has no cervical adenopathy.       Right cervical: No superficial cervical, no deep cervical and no posterior cervical adenopathy present.      Left cervical: No superficial cervical, no deep cervical and no posterior cervical adenopathy present.  Neurological:  She is alert and oriented to person, place, and time. She has normal strength. She is not disoriented. She displays no atrophy and no tremor. No cranial nerve deficit or sensory deficit. She exhibits normal muscle tone. She displays no seizure activity. Coordination and gait normal. GCS eye subscore is 4. GCS verbal subscore is 5. GCS motor subscore is 6.  Skin: Skin is warm, dry and intact. No abrasion, no bruising, no burn, no ecchymosis, no laceration, no lesion, no petechiae and no rash noted. She is not diaphoretic. No cyanosis or erythema. No pallor. Nails show no clubbing.  Psychiatric: She has a normal mood and affect. Her speech is normal and behavior is normal. Judgment and thought content normal. Cognition and memory are normal.  Nursing note and vitals reviewed.         Assessment & Plan:  A-bilateral otitis media acute nonsupportive; seasonal allergic rhinitis; acute recurrent maxillary sinusitis  P-Restart flonase 1 spray each nostril BID, saline 2 sprays each nostril q2h prn congestion.  Use saline in shower twice a day given 1 bottle UD from clinic stock.  After shower administer  flonase wait 15 minutes between nose sprays  If no improvement with 48 hours of saline and flonase start amoxicillin 875mg  po BID x 10 days.  Rx given from [facility].  No evidence of systemic bacterial infection, non toxic and well hydrated.  I do not see where any further testing or imaging is necessary at this time.   I will suggest supportive care, rest, good hygiene and encourage the patient to  take adequate fluids. Tylenol 1000mg  po QID prn pain given 4 packages from clinic stock UD. The patient is to return to clinic or EMERGENCY ROOM if symptoms worsen or change significantly.  Exitcare handout on sinusitis given to patient.  Patient verbalized agreement and understanding of treatment plan and had no further questions at this time.   P2:  Hand washing and cover cough  Supportive treatment.   No evidence of  invasive bacterial infection, non toxic and well hydrated.  This is most likely self limiting viral infection.  I do not see where any further testing or imaging is necessary at this time.   I will suggest supportive care, rest, good hygiene and encourage the patient to take adequate fluids.  The patient is to return to clinic or EMERGENCY ROOM if symptoms worsen or change significantly e.g. ear pain, fever, purulent discharge from ears or bleeding.  Start amoxicillin 875mg  po BID x 10 days #20 RF0 from [facility].  Patient verbalized agreement and understanding of treatment plan and had no further questions at this time    ReStart flonase and nasal saline.  If no improvement consider zyrtec or claritin 10mg  po daily.  Patient has flonase at home from previous illness/sinus infection.  Patient may use normal saline nasal spray as needed.  Consider antihistamine or nasal steroid use.  Avoid triggers if possible.  Shower prior to bedtime if exposed to triggers.  If allergic dust/dust mites recommend mattress/pillow covers/encasements; washing linens, vacuuming, sweeping, dusting weekly.  Call or return to clinic as needed if these symptoms worsen or fail to improve as anticipated.   Exitcare handout on allergic rhinitis given to patient.  Patient verbalized understanding of instructions, agreed with plan of care and had no further questions at this time.  P2:  Avoidance and hand washing.

## 2017-02-15 ENCOUNTER — Ambulatory Visit: Payer: Self-pay | Admitting: Registered Nurse

## 2017-02-15 VITALS — BP 120/88 | HR 94 | Temp 99.4°F

## 2017-02-15 DIAGNOSIS — H10022 Other mucopurulent conjunctivitis, left eye: Secondary | ICD-10-CM

## 2017-02-15 DIAGNOSIS — H1013 Acute atopic conjunctivitis, bilateral: Secondary | ICD-10-CM

## 2017-02-15 MED ORDER — ERYTHROMYCIN 5 MG/GM OP OINT: 1.0000 "application " | TOPICAL_OINTMENT | Freq: Two times a day (BID) | OPHTHALMIC | 0 refills | Status: AC

## 2017-02-15 MED ORDER — CARBOXYMETHYLCELLULOSE SODIUM 1 % OP SOLN: 1.0000 [drp] | Freq: Three times a day (TID) | OPHTHALMIC | 12 refills | Status: DC

## 2017-02-15 MED ORDER — KETOTIFEN FUMARATE 0.025 % OP SOLN
1.0000 [drp] | Freq: Two times a day (BID) | OPHTHALMIC | 0 refills | Status: AC
Start: 2017-02-15 — End: 2017-03-17

## 2017-02-15 NOTE — Patient Instructions (Addendum)
Start erythromycin 1cm left eye twice a day for 7 days Refresh eye drops 2 drops each eye three times a day during allergy season Ketotifen drops 1 drop each eye twice a day during allergy season Shower twice a day Do not rub eyes May apply cool compress x 15 minutes three times a day Change pillowcases/towels daily and wash hot water/dryer consider bleach Optometry same day if visual changes/loss, orbital swelling/redness, fever  Viral Conjunctivitis, Adult Viral conjunctivitis is an inflammation of the clear membrane that covers the white part of your eye and the inner surface of your eyelid (conjunctiva). The inflammation is caused by a viral infection. The blood vessels in the conjunctiva become inflamed, causing the eye to become red or pink, and often itchy. Viral conjunctivitis can be easily passed from one person to another (is contagious). This condition is often called pink eye. What are the causes? This condition is caused by a virus. A virus is a type of contagious germ. It can be spread by touching objects that have been contaminated with the virus, such as doorknobs or towels. It can also be passed through droplets, such as from coughing or sneezing. What are the signs or symptoms? Symptoms of this condition include:  Eye redness.  Tearing or watery eyes.  Itchy and irritated eyes.  Burning feeling in the eyes.  Clear drainage from the eye.  Swollen eyelids.  A gritty feeling in the eye.  Light sensitivity. This condition often occurs with other symptoms, such as a fever, nausea, or a rash. How is this diagnosed? This condition is diagnosed with a medical history and physical exam. If you have discharge from your eye, the discharge may be tested to rule out other causes of conjunctivitis. How is this treated? Viral conjunctivitis does not respond to medicines that kill bacteria (antibiotics). Treatment for viral conjunctivitis is directed at stopping a bacterial  infection from developing in addition to the viral infection. Treatment also aims to relieve your symptoms, such as itching. This may be done with antihistamine drops or other eye medicines. Rarely, steroid eye drops or antiviral medicines may be prescribed. Follow these instructions at home: Medicines    Take or apply over-the-counter and prescription medicines only as told by your health care provider.  Be very careful to avoid touching the edge of the eyelid with the eye drop bottle or ointment tube when applying medicines to the affected eye. Being careful this way will stop you from spreading the infection to the other eye or to other people. Eye care   Avoid touching or rubbing your eyes.  Apply a warm, wet, clean washcloth to your eye for 10-20 minutes, 3-4 times per day or as told by your health care provider.  If you wear contact lenses, do not wear them until the inflammation is gone and your health care provider says it is safe to wear them again. Ask your health care provider how to sterilize or replace your contact lenses before using them again. Wear glasses until you can resume wearing contacts.  Avoid wearing eye makeup until the inflammation is gone. Throw away any old eye cosmetics that may be contaminated.  Gently wipe away any drainage from your eye with a warm, wet washcloth or a cotton ball. General instructions   Change or wash your pillowcase every day or as told by your health care provider.  Do not share towels, pillowcases, washcloths, eye makeup, makeup brushes, contact lenses, or glasses. This may spread the infection.  Wash your hands often with soap and water. Use paper towels to dry your hands. If soap and water are not available, use hand sanitizer.  Try to avoid contact with other people for one week or as told by your health care provider. Contact a health care provider if:  Your symptoms do not improve with treatment or they get worse.  You have  increased pain.  Your vision becomes blurry.  You have a fever.  You have facial pain, redness, or swelling.  You have yellow or green drainage coming from your eye.  You have new symptoms. This information is not intended to replace advice given to you by your health care provider. Make sure you discuss any questions you have with your health care provider. Document Released: 12/16/2002 Document Revised: 04/22/2016 Document Reviewed: 04/11/2016 Elsevier Interactive Patient Education  2017 [place]. Bacterial Conjunctivitis Bacterial conjunctivitis is an infection of the clear membrane that covers the white part of your eye and the inner surface of your eyelid (conjunctiva). When the blood vessels in your conjunctiva become inflamed, your eye becomes red or pink, and it will probably feel itchy. Bacterial conjunctivitis spreads very easily from person to person (is contagious). It also spreads easily from one eye to the other eye. What are the causes? This condition is caused by several common bacteria. You may get the infection if you come into close contact with another person who is infected. You may also come into contact with items that are contaminated with the bacteria, such as a face towel, contact lens solution, or eye makeup. What increases the risk? This condition is more likely to develop in people who:  Are exposed to other people who have the infection.  Wear contact lenses.  Have a sinus infection.  Have had a recent eye injury or surgery.  Have a weak body defense system (immune system).  Have a medical condition that causes dry eyes. What are the signs or symptoms? Symptoms of this condition include:  Eye redness.  Tearing or watery eyes.  Itchy eyes.  Burning feeling in your eyes.  Thick, yellowish discharge from an eye. This may turn into a crust on the eyelid overnight and cause your eyelids to stick together.  Swollen eyelids.  Blurred  vision. How is this diagnosed? Your health care provider can diagnose this condition based on your symptoms and medical history. Your health care provider may also take a sample of discharge from your eye to find the cause of your infection. This is rarely done. How is this treated? Treatment for this condition includes:  Antibiotic eye drops or ointment to clear the infection more quickly and prevent the spread of infection to others.  Oral antibiotic medicines to treat infections that do not respond to drops or ointments, or last longer than 10 days.  Cool, wet cloths (cool compresses) placed on the eyes.  Artificial tears applied 2-6 times a day. Follow these instructions at home: Medicines   Take or apply your antibiotic medicine as told by your health care provider. Do not stop taking or applying the antibiotic even if you start to feel better.  Take or apply over-the-counter and prescription medicines only as told by your health care provider.  Be very careful to avoid touching the edge of your eyelid with the eye drop bottle or the ointment tube when you apply medicines to the affected eye. This will keep you from spreading the infection to your other eye or to other people.  Managing discomfort   Gently wipe away any drainage from your eye with a warm, wet washcloth or a cotton ball.  Apply a cool, clean washcloth to your eye for 10-20 minutes, 3-4 times a day. General instructions   Do not wear contact lenses until the inflammation is gone and your health care provider says it is safe to wear them again. Ask your health care provider how to sterilize or replace your contact lenses before you use them again. Wear glasses until you can resume wearing contacts.  Avoid wearing eye makeup until the inflammation is gone. Throw away any old eye cosmetics that may be contaminated.  Change or wash your pillowcase every day.  Do not share towels or washcloths. This may spread the  infection.  Wash your hands often with soap and water. Use paper towels to dry your hands.  Avoid touching or rubbing your eyes.  Do not drive or use heavy machinery if your vision is blurred. Contact a health care provider if:  You have a fever.  Your symptoms do not get better after 10 days. Get help right away if:  You have a fever and your symptoms suddenly get worse.  You have severe pain when you move your eye.  You have facial pain, redness, or swelling.  You have sudden loss of vision. This information is not intended to replace advice given to you by your health care provider. Make sure you discuss any questions you have with your health care provider. Document Released: 09/25/2005 Document Revised: 02/03/2016 Document Reviewed: 07/08/2015 Elsevier Interactive Patient Education  2017 [employer] Allergic Conjunctivitis, Adult Allergic conjunctivitis is inflammation of the clear membrane that covers the white part of your eye and the inner surface of your eyelid (conjunctiva). The inflammation is caused by allergies. The blood vessels in the conjunctiva become inflamed and this causes the eyes to become red or pink. The eyes often feel itchy. Allergic conjunctivitis cannot be spread from one person to another person (is not contagious). What are the causes? This condition is caused by an allergic reaction. Common causes of an allergic reaction (allergens) include:  Outdoor allergens, such as:  Pollen.  Grass and weeds.  Mold spores.  Indoor allergens, such as:  Dust.  Smoke.  Mold.  Pet dander.  Animal hair. What increases the risk? You may be more likely to develop this condition if you have a family history of allergies, such as:  Allergic rhinitis.  Bronchial asthma.  Atopic dermatitis. What are the signs or symptoms? Symptoms of this condition include eyes that are:  Itchy.  Red.  Watery.  Puffy. Your eyes may also:  Sting or  burn.  Have clear drainage coming from them. How is this diagnosed? This condition may be diagnosed by medical history and physical exam. If you have drainage from your eyes, it may be tested to rule out other causes of conjunctivitis. You may also need to see a health care provider who specializes in treating allergies (allergist) or eye conditions (ophthalmologist) for tests to confirm the diagnosis. You may have:  Skin tests to see which allergens are causing your symptoms. These tests involve pricking the skin with a tiny needle and exposing the skin to small amounts of potential allergens to see if your skin reacts.  Blood tests.  Tissue scrapings from your eyelid. These will be examined under a microscope. How is this treated? Treatments for this condition may include:  Cold cloths (compresses) to soothe itching and swelling.  Washing the face to remove allergens.  Eye drops. These may be prescription or over-the-counter. There are several different types. You may need to try different types to see which one works best for you. Your may need:  Eye drops that block the allergic reaction (antihistamine).  Eye drops that reduce swelling and irritation (anti-inflammatory).  Steroid eye drops to lessen a severe reaction (vernal conjunctivitis).  Oral antihistamine medicines to reduce your allergic reaction. You may need these if eye drops do not help or are difficult to use. Follow these instructions at home:  Avoid known allergens whenever possible.  Take or apply over-the-counter and prescription medicines only as told by your health care provider. These include any eye drops.  Apply a cool, clean washcloth to your eye for 10-20 minutes, 3-4 times a day.  Do not touch or rub your eyes.  Do not wear contact lenses until the inflammation is gone. Wear glasses instead.  Do not wear eye makeup until the inflammation is gone.  Keep all follow-up visits as told by your health  care provider. This is important. Contact a health care provider if:  Your symptoms get worse or do not improve with treatment.  You have mild eye pain.  You have sensitivity to light.  You have spots or blisters on your eyes.  You have pus draining from your eye.  You have a fever. Get help right away if:  You have redness, swelling, or other symptoms in only one eye.  Your vision is blurred or you have vision changes.  You have severe eye pain. This information is not intended to replace advice given to you by your health care provider. Make sure you discuss any questions you have with your health care provider. Document Released: 12/16/2002 Document Revised: 05/24/2016 Document Reviewed: 04/07/2016 Elsevier Interactive Patient Education  2017 [employer].

## 2017-02-15 NOTE — Progress Notes (Signed)
Subjective:    Patient ID: Diana Lutz, female    DOB: [birth year], 51 y.o.   MRN: [id number]  50y/o married caucasian female c/o watery drainage to L eye x4 days. Waking up with L eye crusted closed. Now R eye starting to bother her as well. Reports eyes are "irritable"/scratchy, but not very itchy. Denied fever, visual changes, headache, nausea, vomiting, others with similar symptoms.  Some grittiness to eyes in morning when crusted closed.  Not wearing contacts..glasses typically  PMHx seasonal allergies      Review of Systems  Constitutional: Negative for activity change, appetite change, chills, diaphoresis, fatigue, fever and unexpected weight change.  HENT: Positive for congestion and postnasal drip. Negative for dental problem, drooling, ear discharge, ear pain, facial swelling, hearing loss, mouth sores, nosebleeds, rhinorrhea, sinus pain, sinus pressure, sneezing, sore throat, tinnitus, trouble swallowing and voice change.   Eyes: Positive for discharge, redness and itching. Negative for photophobia, pain and visual disturbance.  Respiratory: Negative for cough, choking, chest tightness, shortness of breath, wheezing and stridor.   Cardiovascular: Negative for chest pain, palpitations and leg swelling.  Gastrointestinal: Negative for abdominal distention, abdominal pain, blood in stool, constipation, diarrhea, nausea and vomiting.  Endocrine: Negative for cold intolerance and heat intolerance.  Genitourinary: Negative for difficulty urinating, dysuria and hematuria.  Musculoskeletal: Negative for arthralgias, back pain, gait problem, joint swelling, myalgias, neck pain and neck stiffness.  Skin: Negative for color change, pallor, rash and wound.  Allergic/Immunologic: Positive for environmental allergies. Negative for food allergies.  Neurological: Negative for dizziness, tremors, seizures, syncope, facial asymmetry, speech difficulty, weakness, light-headedness, numbness and  headaches.  Hematological: Negative for adenopathy. Does not bruise/bleed easily.  Psychiatric/Behavioral: Negative for agitation, behavioral problems, confusion and sleep disturbance.       Objective:   Physical Exam  Constitutional: She is oriented to person, place, and time. Vital signs are normal. She appears well-developed and well-nourished. She is active and cooperative.  Non-toxic appearance. She does not have a sickly appearance. She does not appear ill. No distress.  HENT:  Head: Normocephalic and atraumatic.  Right Ear: Hearing, external ear and ear canal normal. A middle ear effusion is present.  Left Ear: Hearing, external ear and ear canal normal. A middle ear effusion is present.  Nose: Mucosal edema present. No rhinorrhea, nose lacerations, sinus tenderness, nasal deformity, septal deviation or nasal septal hematoma. No epistaxis.  No foreign bodies. Right sinus exhibits no maxillary sinus tenderness and no frontal sinus tenderness. Left sinus exhibits no maxillary sinus tenderness and no frontal sinus tenderness.  Mouth/Throat: Uvula is midline and mucous membranes are normal. Mucous membranes are not pale, not dry and not cyanotic. She does not have dentures. No oral lesions. No trismus in the jaw. Normal dentition. No dental abscesses, uvula swelling, lacerations or dental caries. Posterior oropharyngeal edema and posterior oropharyngeal erythema present. No oropharyngeal exudate or tonsillar abscesses.  Cobblestoning posterior pharynx; bilateral TMs air fluid level clear; bilateral allergic shiners; bilateral nasal turbinates edema  Eyes: EOM and lids are normal. Pupils are equal, round, and reactive to light. Right eye exhibits no chemosis, no discharge, no exudate and no hordeolum. No foreign body present in the right eye. Left eye exhibits no chemosis, no discharge, no exudate and no hordeolum. No foreign body present in the left eye. Right conjunctiva is injected. Right  conjunctiva has no hemorrhage. Left conjunctiva is injected. Left conjunctiva has no hemorrhage. No scleral icterus. Right eye exhibits normal extraocular motion and  no nystagmus. Left eye exhibits normal extraocular motion and no nystagmus. Right pupil is round and reactive. Left pupil is round and reactive. Pupils are equal.  Bulbar conjunctiva injected left eye 2+/4 and vasculature inflamed laterally; bilateral eyelid conjunctiva 2+/4 injection; upper eyelid left eye mild edema 0-1+/4 not TTP  Neck: Trachea normal, normal range of motion and phonation normal. Neck supple. No tracheal tenderness, no spinous process tenderness and no muscular tenderness present. No neck rigidity. No tracheal deviation, no edema, no erythema and normal range of motion present. No thyroid mass and no thyromegaly present.  Cardiovascular: Normal rate, regular rhythm, S1 normal, S2 normal, normal heart sounds and intact distal pulses.  PMI is not displaced.  Exam reveals no gallop and no friction rub.   No murmur heard. Pulmonary/Chest: Effort normal and breath sounds normal. No accessory muscle usage or stridor. No respiratory distress. She has no decreased breath sounds. She has no wheezes. She has no rhonchi. She has no rales. She exhibits no tenderness.  Abdominal: Soft. She exhibits no distension.  Musculoskeletal: Normal range of motion. She exhibits no edema or tenderness.       Right shoulder: Normal.       Left shoulder: Normal.       Right hip: Normal.       Left hip: Normal.       Right knee: Normal.       Left knee: Normal.       Cervical back: Normal.       Right hand: Normal.       Left hand: Normal.  Lymphadenopathy:       Head (right side): No submental, no submandibular, no tonsillar, no preauricular, no posterior auricular and no occipital adenopathy present.       Head (left side): No submental, no submandibular, no tonsillar, no preauricular, no posterior auricular and no occipital adenopathy  present.    She has no cervical adenopathy.       Right cervical: No superficial cervical, no deep cervical and no posterior cervical adenopathy present.      Left cervical: No superficial cervical, no deep cervical and no posterior cervical adenopathy present.  Neurological: She is alert and oriented to person, place, and time. She has normal strength. She is not disoriented. She displays no atrophy and no tremor. No cranial nerve deficit or sensory deficit. She exhibits normal muscle tone. She displays no seizure activity. Coordination and gait normal. GCS eye subscore is 4. GCS verbal subscore is 5. GCS motor subscore is 6.  On/off exam table; in/out of chair and gait sure and steady hallway  Skin: Skin is warm, dry and intact. No abrasion, no bruising, no burn, no ecchymosis, no laceration, no lesion, no petechiae and no rash noted. She is not diaphoretic. No cyanosis or erythema. No pallor. Nails show no clubbing.  Psychiatric: She has a normal mood and affect. Her speech is normal and behavior is normal. Judgment and thought content normal. Cognition and memory are normal.  Nursing note and vitals reviewed.         Assessment & Plan:  A-conjunctivitis allergic bilateral; left mucopurulent acute  P-Cleared for work  Start antihistamine medication consider ketotifen 1 drop twice a day during allergy season Rx given as FSA.  Refresh gtts 2 gtts ou TID prn irritation.  Given 4 UD refresh gtts from clinic stock.  Shower after allergen exposure prior to bed.  Hygiene discussed.  Patient to apply warm packs prn as directed.  Instructed patient to not rub eyes.  May use over the counter eye drops/tears for pain/symptom relief.  Return to clinic if headache, fever greater than 100.56F, nausea/vomiting, purulent discharge/matting unable to open eye without using fingers, foreign body sensation, ciliary flush, worsening photophobia or vision.  Call or return to clinic as needed if these symptoms worsen  or fail to improve as anticipated.  Patient given [employer] handout on allergic conjunctivitis.  Patient verbalized agreement and understanding of treatment plan.   P2:  Hand washing, avoid contact use-wear glasses.  Cleared for work   Nurse, mental health discussed.  Patient to apply warm packs prn as directed.  Instructed patient to not rub eyes.  May need to wash pillowcases more frequently until infection resolves. Erythromycin opthalmic 1cm ribbon left eye twice a day x 7 days #1 RF0 pick up at [facility].  May use over the counter eye drops/tears for pain/symptom relief.  Return to clinic if headache, fever greater than 100.56F, nausea/vomiting, purulent discharge/matting unable to open eye without using fingers, foreign body sensation, ciliary flush, worsening photophobia or vision.  Call or return to clinic as needed if these symptoms worsen or fail to improve as anticipated.  Patient given [employer] handout on viral and bacterial  conjunctivitis.  Patient verbalized agreement and understanding of treatment plan.   P2:  Hand washing, avoid contact use-wear glasses.

## 2017-03-15 ENCOUNTER — Ambulatory Visit: Payer: Self-pay | Admitting: Registered Nurse

## 2017-03-15 VITALS — BP 136/99 | HR 74 | Temp 98.9°F

## 2017-03-15 DIAGNOSIS — H1013 Acute atopic conjunctivitis, bilateral: Secondary | ICD-10-CM

## 2017-03-15 DIAGNOSIS — J309 Allergic rhinitis, unspecified: Secondary | ICD-10-CM

## 2017-03-15 DIAGNOSIS — H6593 Unspecified nonsuppurative otitis media, bilateral: Secondary | ICD-10-CM

## 2017-03-15 DIAGNOSIS — J029 Acute pharyngitis, unspecified: Secondary | ICD-10-CM

## 2017-03-15 MED ORDER — CARBOXYMETHYLCELLULOSE SODIUM 1 % OP SOLN: 1.0000 [drp] | Freq: Three times a day (TID) | OPHTHALMIC | 12 refills | Status: DC

## 2017-03-15 MED ORDER — AMOXICILLIN 875 MG PO TABS: 875.0000 mg | ORAL_TABLET | Freq: Two times a day (BID) | ORAL | 0 refills | Status: DC

## 2017-03-15 NOTE — Patient Instructions (Signed)
Allergic Conjunctivitis, Adult Allergic conjunctivitis is inflammation of the clear membrane that covers the white part of your eye and the inner surface of your eyelid (conjunctiva). The inflammation is caused by allergies. The blood vessels in the conjunctiva become inflamed and this causes the eyes to become red or pink. The eyes often feel itchy. Allergic conjunctivitis cannot be spread from one person to another person (is not contagious). What are the causes? This condition is caused by an allergic reaction. Common causes of an allergic reaction (allergens) include:  Outdoor allergens, such as: ? Pollen. ? Grass and weeds. ? Mold spores.  Indoor allergens, such as: ? Dust. ? Smoke. ? Mold. ? Pet dander. ? Animal hair.  What increases the risk? You may be more likely to develop this condition if you have a family history of allergies, such as:  Allergic rhinitis.  Bronchial asthma.  Atopic dermatitis.  What are the signs or symptoms? Symptoms of this condition include eyes that are:  Itchy.  Red.  Watery.  Puffy.  Your eyes may also:  Sting or burn.  Have clear drainage coming from them.  How is this diagnosed? This condition may be diagnosed by medical history and physical exam. If you have drainage from your eyes, it may be tested to rule out other causes of conjunctivitis. You may also need to see a health care provider who specializes in treating allergies (allergist) or eye conditions (ophthalmologist) for tests to confirm the diagnosis. You may have:  Skin tests to see which allergens are causing your symptoms. These tests involve pricking the skin with a tiny needle and exposing the skin to small amounts of potential allergens to see if your skin reacts.  Blood tests.  Tissue scrapings from your eyelid. These will be examined under a microscope.  How is this treated? Treatments for this condition may include:  Cold cloths (compresses) to soothe  itching and swelling.  Washing the face to remove allergens.  Eye drops. These may be prescription or over-the-counter. There are several different types. You may need to try different types to see which one works best for you. Your may need: ? Eye drops that block the allergic reaction (antihistamine). ? Eye drops that reduce swelling and irritation (anti-inflammatory). ? Steroid eye drops to lessen a severe reaction (vernal conjunctivitis).  Oral antihistamine medicines to reduce your allergic reaction. You may need these if eye drops do not help or are difficult to use.  Follow these instructions at home:  Avoid known allergens whenever possible.  Take or apply over-the-counter and prescription medicines only as told by your health care provider. These include any eye drops.  Apply a cool, clean washcloth to your eye for 10-20 minutes, 3-4 times a day.  Do not touch or rub your eyes.  Do not wear contact lenses until the inflammation is gone. Wear glasses instead.  Do not wear eye makeup until the inflammation is gone.  Keep all follow-up visits as told by your health care provider. This is important. Contact a health care provider if:  Your symptoms get worse or do not improve with treatment.  You have mild eye pain.  You have sensitivity to light.  You have spots or blisters on your eyes.  You have pus draining from your eye.  You have a fever. Get help right away if:  You have redness, swelling, or other symptoms in only one eye.  Your vision is blurred or you have vision changes.  You have  severe eye pain. This information is not intended to replace advice given to you by your health care provider. Make sure you discuss any questions you have with your health care provider. Document Released: 12/16/2002 Document Revised: 05/24/2016 Document Reviewed: 04/07/2016 Elsevier Interactive Patient Education  2018 [employer]. Sinus Rinse What is a sinus rinse? A  sinus rinse is a simple home treatment that is used to rinse your sinuses with a sterile mixture of salt and water (saline solution). Sinuses are air-filled spaces in your skull behind the bones of your face and forehead that open into your nasal cavity. You will use the following:  Saline solution.  Neti pot or spray bottle. This releases the saline solution into your nose and through your sinuses. Neti pots and spray bottles can be purchased at [employer], a health food store, or online.  When would I do a sinus rinse? A sinus rinse can help to clear mucus, dirt, dust, or pollen from the nasal cavity. You may do a sinus rinse when you have a cold, a virus, nasal allergy symptoms, a sinus infection, or stuffiness in the nose or sinuses. If you are considering a sinus rinse:  Ask your child's health care provider before performing a sinus rinse on your child.  Do not do a sinus rinse if you have had ear or nasal surgery, ear infection, or blocked ears.  How do I do a sinus rinse?  Wash your hands.  Disinfect your device according to the directions provided and then dry it.  Use the solution that comes with your device or one that is sold separately in stores. Follow the mixing directions on the package.  Fill your device with the amount of saline solution as directed by the device instructions.  Stand over a sink and tilt your head sideways over the sink.  Place the spout of the device in your upper nostril (the one closer to the ceiling).  Gently pour or squeeze the saline solution into the nasal cavity. The liquid should drain to the lower nostril if you are not overly congested.  Gently blow your nose. Blowing too hard may cause ear pain.  Repeat in the other nostril.  Clean and rinse your device with clean water and then air-dry it. Are there risks of a sinus rinse? Sinus rinse is generally very safe and effective. However, there are a few risks, which include:  A  burning sensation in the sinuses. This may happen if you do not make the saline solution as directed. Make sure to follow all directions when making the saline solution.  Infection from contaminated water. This is rare, but possible.  Nasal irritation.  This information is not intended to replace advice given to you by your health care provider. Make sure you discuss any questions you have with your health care provider. Document Released: 04/22/2014 Document Revised: 08/22/2016 Document Reviewed: 02/10/2014 Elsevier Interactive Patient Education  2017 [employer]. Allergic Rhinitis Allergic rhinitis is when the mucous membranes in the nose respond to allergens. Allergens are particles in the air that cause your body to have an allergic reaction. This causes you to release allergic antibodies. Through a chain of events, these eventually cause you to release histamine into the blood stream. Although meant to protect the body, it is this release of histamine that causes your discomfort, such as frequent sneezing, congestion, and an itchy, runny nose. What are the causes? Seasonal allergic rhinitis (hay fever) is caused by pollen allergens that  may come from grasses, trees, and weeds. Year-round allergic rhinitis (perennial allergic rhinitis) is caused by allergens such as house dust mites, pet dander, and mold spores. What are the signs or symptoms?  Nasal stuffiness (congestion).  Itchy, runny nose with sneezing and tearing of the eyes. How is this diagnosed? Your health care provider can help you determine the allergen or allergens that trigger your symptoms. If you and your health care provider are unable to determine the allergen, skin or blood testing may be used. Your health care provider will diagnose your condition after taking your health history and performing a physical exam. Your health care provider may assess you for other related conditions, such as asthma, pink eye, or an ear  infection. How is this treated? Allergic rhinitis does not have a cure, but it can be controlled by:  Medicines that block allergy symptoms. These may include allergy shots, nasal sprays, and oral antihistamines.  Avoiding the allergen.  Hay fever may often be treated with antihistamines in pill or nasal spray forms. Antihistamines block the effects of histamine. There are over-the-counter medicines that may help with nasal congestion and swelling around the eyes. Check with your health care provider before taking or giving this medicine. If avoiding the allergen or the medicine prescribed do not work, there are many new medicines your health care provider can prescribe. Stronger medicine may be used if initial measures are ineffective. Desensitizing injections can be used if medicine and avoidance does not work. Desensitization is when a patient is given ongoing shots until the body becomes less sensitive to the allergen. Make sure you follow up with your health care provider if problems continue. Follow these instructions at home: It is not possible to completely avoid allergens, but you can reduce your symptoms by taking steps to limit your exposure to them. It helps to know exactly what you are allergic to so that you can avoid your specific triggers. Contact a health care provider if:  You have a fever.  You develop a cough that does not stop easily (persistent).  You have shortness of breath.  You start wheezing.  Symptoms interfere with normal daily activities. This information is not intended to replace advice given to you by your health care provider. Make sure you discuss any questions you have with your health care provider. Document Released: 06/20/2001 Document Revised: 05/26/2016 Document Reviewed: 06/02/2013 Elsevier Interactive Patient Education  2017 [employer] Pharyngitis Pharyngitis is redness, pain, and swelling (inflammation) of your pharynx. What are the  causes? Pharyngitis is usually caused by infection. Most of the time, these infections are from viruses (viral) and are part of a cold. However, sometimes pharyngitis is caused by bacteria (bacterial). Pharyngitis can also be caused by allergies. Viral pharyngitis may be spread from person to person by coughing, sneezing, and personal items or utensils (cups, forks, spoons, toothbrushes). Bacterial pharyngitis may be spread from person to person by more intimate contact, such as kissing. What are the signs or symptoms? Symptoms of pharyngitis include:  Sore throat.  Tiredness (fatigue).  Low-grade fever.  Headache.  Joint pain and muscle aches.  Skin rashes.  Swollen lymph nodes.  Plaque-like film on throat or tonsils (often seen with bacterial pharyngitis).  How is this diagnosed? Your health care provider will ask you questions about your illness and your symptoms. Your medical history, along with a physical exam, is often all that is needed to diagnose pharyngitis. Sometimes, a rapid strep test is done. Other lab tests  may also be done, depending on the suspected cause. How is this treated? Viral pharyngitis will usually get better in 3-4 days without the use of medicine. Bacterial pharyngitis is treated with medicines that kill germs (antibiotics). Follow these instructions at home:  Drink enough water and fluids to keep your urine clear or pale yellow.  Only take over-the-counter or prescription medicines as directed by your health care provider: ? If you are prescribed antibiotics, make sure you finish them even if you start to feel better. ? Do not take aspirin.  Get lots of rest.  Gargle with 8 oz of salt water ( tsp of salt per 1 qt of water) as often as every 1-2 hours to soothe your throat.  Throat lozenges (if you are not at risk for choking) or sprays may be used to soothe your throat. Contact a health care provider if:  You have large, tender lumps in your  neck.  You have a rash.  You cough up green, yellow-brown, or bloody spit. Get help right away if:  Your neck becomes stiff.  You drool or are unable to swallow liquids.  You vomit or are unable to keep medicines or liquids down.  You have severe pain that does not go away with the use of recommended medicines.  You have trouble breathing (not caused by a stuffy nose). This information is not intended to replace advice given to you by your health care provider. Make sure you discuss any questions you have with your health care provider. Document Released: 09/25/2005 Document Revised: 03/02/2016 Document Reviewed: 06/02/2013 Elsevier Interactive Patient Education  2017 [employer].

## 2017-03-15 NOTE — Progress Notes (Signed)
Subjective:    Patient ID: Diana Lutz, female    DOB: 07-Aug-1966, 51 y.o.   MRN: [id number]  51y/o caucasian female reports sore throat, painful to swallow, productive cough, sinus congestion x1 week. Has been helping daughter move for the last 2 weeks and daughter has strep throat only took 2 days of her antibiotics and then stopped as throat had stopped hurting.  Daughter drinking out of mother's glass.  Her daughter's throat started hurting again so she restarted antibiotic this week.  Note pt has not taken BP med yet today.  Patient reported some matting continues of her eyes in the morning but symptoms improving.  Ran out of refresh drops.  Still using flonase and nasal saline for allergic rhinitis.  Feels hot but denied fever.  Has not taken tylenol since yesterday.      Review of Systems  Constitutional: Positive for diaphoresis and fatigue. Negative for activity change, appetite change, chills, fever and unexpected weight change.  HENT: Positive for congestion, postnasal drip, rhinorrhea and sore throat. Negative for dental problem, drooling, ear discharge, ear pain, facial swelling, hearing loss, mouth sores, nosebleeds, sinus pain, sinus pressure, sneezing, tinnitus, trouble swallowing and voice change.   Eyes: Positive for discharge. Negative for photophobia, pain, redness, itching and visual disturbance.  Respiratory: Positive for cough. Negative for choking, chest tightness, shortness of breath, wheezing and stridor.   Cardiovascular: Negative for chest pain, palpitations and leg swelling.  Gastrointestinal: Negative for abdominal distention, abdominal pain, blood in stool, constipation, diarrhea, nausea and vomiting.  Endocrine: Negative for cold intolerance and heat intolerance.  Genitourinary: Negative for difficulty urinating, dysuria and hematuria.  Musculoskeletal: Negative for arthralgias, back pain, gait problem, joint swelling, myalgias, neck pain and neck  stiffness.  Skin: Negative for color change, pallor, rash and wound.  Allergic/Immunologic: Positive for environmental allergies. Negative for food allergies.  Neurological: Negative for dizziness, tremors, seizures, syncope, facial asymmetry, speech difficulty, weakness, light-headedness, numbness and headaches.  Hematological: Negative for adenopathy. Does not bruise/bleed easily.  Psychiatric/Behavioral: Negative for agitation, behavioral problems, confusion and sleep disturbance.       Objective:   Physical Exam  Constitutional: She is oriented to person, place, and time. She appears well-developed and well-nourished. She is active and cooperative.  Non-toxic appearance. She does not have a sickly appearance. She appears ill. No distress.  HENT:  Head: Normocephalic and atraumatic.  Right Ear: Hearing, external ear and ear canal normal. A middle ear effusion is present.  Left Ear: Hearing, external ear and ear canal normal. A middle ear effusion is present.  Nose: Mucosal edema and rhinorrhea present. No nose lacerations, sinus tenderness, nasal deformity, septal deviation or nasal septal hematoma. No epistaxis.  No foreign bodies. Right sinus exhibits no maxillary sinus tenderness and no frontal sinus tenderness. Left sinus exhibits no maxillary sinus tenderness and no frontal sinus tenderness.  Mouth/Throat: Uvula is midline and mucous membranes are normal. Mucous membranes are not pale, not dry and not cyanotic. She does not have dentures. No oral lesions. No trismus in the jaw. Normal dentition. No dental abscesses, uvula swelling, lacerations or dental caries. Posterior oropharyngeal edema and posterior oropharyngeal erythema present. No oropharyngeal exudate or tonsillar abscesses.  Oropharyngeal macular erythema; cobblestoning posterior pharynx; bilateral allergic shiners; hoarse voice; bilateral TMs air fluid level clear; cerumen occludes 90% bilateral external canals; bilateral nasal  turbinates edema/erythema clear discharge  Eyes: Conjunctivae, EOM and lids are normal. Pupils are equal, round, and reactive to light. Right eye  exhibits no chemosis, no discharge, no exudate and no hordeolum. No foreign body present in the right eye. Left eye exhibits no chemosis, no discharge, no exudate and no hordeolum. No foreign body present in the left eye. Right conjunctiva is not injected. Right conjunctiva has no hemorrhage. Left conjunctiva is not injected. Left conjunctiva has no hemorrhage. No scleral icterus. Right eye exhibits normal extraocular motion and no nystagmus. Left eye exhibits normal extraocular motion and no nystagmus. Right pupil is round and reactive. Left pupil is round and reactive. Pupils are equal.  Neck: Trachea normal, normal range of motion and phonation normal. Neck supple. No tracheal tenderness, no spinous process tenderness and no muscular tenderness present. No neck rigidity. No tracheal deviation, no edema, no erythema and normal range of motion present. No thyroid mass and no thyromegaly present.  Cardiovascular: Normal rate, regular rhythm, S1 normal, S2 normal, normal heart sounds and intact distal pulses.  PMI is not displaced.  Exam reveals no gallop and no friction rub.   No murmur heard. Pulmonary/Chest: Effort normal and breath sounds normal. No accessory muscle usage or stridor. No respiratory distress. She has no decreased breath sounds. She has no wheezes. She has no rhonchi. She has no rales. She exhibits no tenderness.  Intermittent nonproductive cough observed in exam room; speaks full sentences without difficulty  Abdominal: Soft. She exhibits no distension.  Musculoskeletal: Normal range of motion. She exhibits no edema or tenderness.       Right shoulder: Normal.       Left shoulder: Normal.       Right hip: Normal.       Left hip: Normal.       Right knee: Normal.       Left knee: Normal.       Cervical back: Normal.       Right hand:  Normal.       Left hand: Normal.  Lymphadenopathy:       Head (right side): No submental, no submandibular, no tonsillar, no preauricular, no posterior auricular and no occipital adenopathy present.       Head (left side): No submental, no submandibular, no tonsillar, no preauricular, no posterior auricular and no occipital adenopathy present.    She has no cervical adenopathy.       Right cervical: No superficial cervical, no deep cervical and no posterior cervical adenopathy present.      Left cervical: No superficial cervical, no deep cervical and no posterior cervical adenopathy present.  Neurological: She is alert and oriented to person, place, and time. She has normal strength. She is not disoriented. She displays no atrophy and no tremor. No cranial nerve deficit or sensory deficit. She exhibits normal muscle tone. She displays no seizure activity. Coordination and gait normal. GCS eye subscore is 4. GCS verbal subscore is 5. GCS motor subscore is 6.  On/off exam table; in/out of chair without difficulty; gait sure and steady in hall/clinic  Skin: Skin is warm, dry and intact. No abrasion, no bruising, no burn, no ecchymosis, no laceration, no lesion, no petechiae and no rash noted. She is not diaphoretic. No cyanosis or erythema. No pallor. Nails show no clubbing.  Cheeks and chest flushed  Psychiatric: She has a normal mood and affect. Her speech is normal and behavior is normal. Judgment and thought content normal. Cognition and memory are normal.  Nursing note and vitals reviewed.         Assessment & Plan:  A-acute pharyngitis; bilateral  otitis media effusion; seasonal allergic rhinitis/conjunctivitis  P- Amoxicillin 875mg  po BID x 10 days #20 RF0 dispensed from [facility]; given 3 refresh plus ud from clinic stock  Tylenol 1000mg  po QID prn pain.  Discussed iced beverages.  Honey with lemon.  Usually no specific medical treatment is needed if a virus is causing the sore throat.  The  throat most often gets better on its own within 5 to 7 days.  Antibiotic medicine does not cure viral pharyngitis.   For acute pharyngitis caused by bacteria, your healthcare provider will prescribe an antibiotic.  Marland Kitchen Do not smoke.  Marland Kitchen Avoid secondhand smoke and other air pollutants.  . Use a cool mist humidifier to add moisture to the air.  . Get plenty of rest.  . You may want to rest your throat by talking less and eating a diet that is mostly liquid or soft for a day or two.   Marland Kitchen Nonprescription throat lozenges and mouthwashes should help relieve the soreness.   . Gargling with warm saltwater and drinking warm liquids may help.  (You can make a saltwater solution by adding 1/4 teaspoon of salt to 8 ounces, or 240 mL, of warm water.)  . A nonprescription pain reliever such as aspirin, acetaminophen, or ibuprofen may ease general aches and pains.   FOLLOW UP with clinic provider if no improvements in the next 7-10 days.  Patient verbalized understanding of instructions and agreed with plan of care. P2:  Hand washing and diet.  Patient may use normal saline nasal spray as needed.  Consider antihistamine or nasal steroid use.  Avoid triggers if possible.  Shower prior to bedtime if exposed to triggers.  If allergic dust/dust mites recommend mattress/pillow covers/encasements; washing linens, vacuuming, sweeping, dusting weekly.  Call or return to clinic as needed if these symptoms worsen or fail to improve as anticipated.   Exitcare handout on allergic rhinitis given to patient.  Patient verbalized understanding of instructions, agreed with plan of care and had no further questions at this time.  P2:  Avoidance and hand washing.  Supportive treatment.   No evidence of invasive bacterial infection, non toxic and well hydrated.  This is most likely self limiting viral infection.  I do not see where any further testing or imaging is necessary at this time.   I will suggest supportive care, rest, good  hygiene and encourage the patient to take adequate fluids.  The patient is to return to clinic or EMERGENCY ROOM if symptoms worsen or change significantly e.g. ear pain, fever, purulent discharge from ears or bleeding.  Exitcare handout on otitis media with effusion given to patient.  Patient verbalized agreement and understanding of treatment plan.    Continue antihistamine medication and refresh opthalmic drops 2 ou TID prn.  Shower after allergen exposure prior to bed.  Hygiene discussed.. Patient to apply warm packs prn as directed.  Instructed patient to not rub eyes.  May use over the counter eye drops/tears for pain/symptom relief.  Return to clinic if headache, fever greater than 100.9F, nausea/vomiting, purulent discharge/matting unable to open eye without using fingers, foreign body sensation, ciliary flush, worsening photophobia or vision.  Call or return to clinic as needed if these symptoms worsen or fail to improve as anticipated.  Patient given Exitcare handout on allergic conjunctivitis.  Patient verbalized agreement and understanding of treatment plan.   P2:  Hand washing, avoid contact use-wear glasses.  Bronchitis simple, community acquired, may have started as viral (probably respiratory  syncytial, parainfluenza, influenza, or adenovirus), but now evidence of acute purulent bronchitis with resultant bronchial edema and mucus formation.  Viruses are the most common cause of bronchial inflammation in otherwise healthy adults with acute bronchitis.  The appearance of sputum is not predictive of whether a bacterial infection is present.  Purulent sputum is most often caused by viral infections.  There are a small portion of those caused by non-viral agents being Mycoplamsa pneumonia.  Microscopic examination or C&S of sputum in the healthy adult with acute bronchitis is generally not helpful (usually negative or normal respiratory flora) other considerations being cough from upper respiratory  tract infections, sinusitis or allergic syndromes (mild asthma or viral pneumonia).  Differential Diagnosis:  reactive airway disease (asthma, allergic aspergillosis (eosinophilia), chronic bronchitis, respiratory infection (Sinusitis, Common cold, pneumonia), congestive heart failure, reflux esophagitis, bronchogenic tumor, aspiration syndromes and/or exposure irritants/tobacco smoke.  In this case, there is no evidence of any invasive bacterial illness.  Most likely viral etiology so will hold on antibiotic treatment.  Advise supportive care with rest, encourage fluids, good hygiene and watch for any worsening symptoms.  If they were to develop:  come back to the office or go to the emergency room if after hours. Without high fever, severe dyspnea, lack of physical findings or other risk factors, I will hold on a chest radiograph and CBC at this time. I discussed that approximately 50% of patients with acute bronchitis have a cough that lasts up to three weeks, and 25% for over a month.  Tylenol, one to two tablets every four hours as needed for fever or myalgias.   No aspirin.  Patient instructed to follow up in one week or sooner if symptoms worsen. Patient verbalized agreement and understanding of treatment plan.  P2:  hand washing and cover cough

## 2017-04-01 ENCOUNTER — Other Ambulatory Visit: Payer: Self-pay | Admitting: Pulmonary Disease

## 2017-07-20 ENCOUNTER — Other Ambulatory Visit: Payer: Self-pay | Admitting: Primary Care

## 2017-07-20 ENCOUNTER — Ambulatory Visit: Payer: Self-pay | Admitting: *Deleted

## 2017-07-20 ENCOUNTER — Ambulatory Visit (INDEPENDENT_AMBULATORY_CARE_PROVIDER_SITE_OTHER): Payer: No Typology Code available for payment source | Admitting: Primary Care

## 2017-07-20 VITALS — BP 127/98 | HR 81 | Ht 66.5 in | Wt 242.0 lb

## 2017-07-20 VITALS — BP 114/90 | HR 81 | Temp 98.1°F | Ht 66.5 in | Wt 241.4 lb

## 2017-07-20 DIAGNOSIS — K219 Gastro-esophageal reflux disease without esophagitis: Secondary | ICD-10-CM | POA: Diagnosis not present

## 2017-07-20 DIAGNOSIS — I10 Essential (primary) hypertension: Secondary | ICD-10-CM | POA: Diagnosis not present

## 2017-07-20 DIAGNOSIS — Z0000 Encounter for general adult medical examination without abnormal findings: Secondary | ICD-10-CM

## 2017-07-20 MED ORDER — OMEPRAZOLE 20 MG PO CPDR: 20.0000 mg | DELAYED_RELEASE_CAPSULE | Freq: Every day | ORAL | 1 refills | Status: DC

## 2017-07-20 MED ORDER — LOSARTAN POTASSIUM 50 MG PO TABS: 100.0000 mg | ORAL_TABLET | Freq: Every day | ORAL | 0 refills | Status: DC

## 2017-07-20 NOTE — Assessment & Plan Note (Signed)
Doing well on omeprazole, refills provided today.

## 2017-07-20 NOTE — Progress Notes (Signed)
Subjective:    Patient ID: Diana Lutz, female    DOB: [birth year], 51 y.o.   MRN: [id number]  HPI  Diana Lutz is a 51 year old female who presents today with multiple complaints.  1) Essential Hypertension: Currently managed on losartan 50 mg which was switched from lisinopril due to cough. She's been taking this for the past one year. She's been checking her BP that's been running 130's/90's on 50 mg of losartan. She's been taking 75 mg of losartan intermittently if she starts "feeling bad" with lightheadedness, flushing, hot flashes. She took 100 mg of losartan this morning and her BP was 127/98 1.5 hours later after taking her medication.   She denies chest pain, dizziness, shortness of breath.  BP Readings from Last 3 Encounters:  07/20/17 114/90  07/20/17 (!) 127/98  03/15/17 (!) 136/99   2) GERD: Currently managed on omeprazole 20 mg. She will experience belching, esophageal burning without her omeprazole. She is needing refills today.   Review of Systems  Eyes: Negative for visual disturbance.  Respiratory: Negative for shortness of breath.   Cardiovascular: Negative for chest pain.  Genitourinary:       Hot flashes  Neurological: Negative for dizziness.       Occasional lightheadedness       Past Medical History:  Diagnosis Date  . Abnormal Pap smear of cervix 1991  . Anemia    history of anemia  . Dysplasia of cervix    cervical ca  . GERD (gastroesophageal reflux disease)   . Hypertension   . Kidney stone   . Migraines      Social History   Social History  . Marital status: Married    Spouse name: N/A  . Number of children: N/A  . Years of education: N/A   Occupational History  . Not on file.   Social History Main Topics  . Smoking status: Never Smoker  . Smokeless tobacco: Never Used  . Alcohol use 0.0 oz/week     Comment: social  . Drug use: No  . Sexual activity: Not Currently   Other Topics Concern  . Not on file   Social  History Narrative   Married.   3 children   Works for [employer] as Engineer, manufacturing systems.   Enjoys four wheeling, working on the house    Past Surgical History:  Procedure Laterality Date  . ANTERIOR AND POSTERIOR REPAIR N/A 08/18/2015   Procedure: ANTERIOR (CYSTOCELE) AND POSTERIOR REPAIR (RECTOCELE)/Perineorrhaphy;  Surgeon: Azucena Fallen, MD;  Location: [facility];  Service: Gynecology;  Laterality: N/A;  2 hrs.  Marland Kitchen BLADDER SUSPENSION N/A 08/18/2015   Procedure: TRANSVAGINAL TAPE (TVT) Mid-Urethral Sling PROCEDURE;  Surgeon: Azucena Fallen, MD;  Location: [facility];  Service: Gynecology;  Laterality: N/A;  . CYSTOSCOPY N/A 08/18/2015   Procedure: CYSTOSCOPY;  Surgeon: Azucena Fallen, MD;  Location: [facility];  Service: Gynecology;  Laterality: N/A;  . HYSTEROSCOPY W/ ENDOMETRIAL ABLATION    . LASER ABLATION OF THE CERVIX    . WISDOM TOOTH EXTRACTION      Family History  Problem Relation Age of Onset  . Hypertension Father   . Stroke Father   . Heart attack Father   . Prostate cancer Father   . Bladder Cancer Father   . Hypertension Mother   . Diverticulosis Mother   . Hypertension Brother   . Diabetes Brother     No Known Allergies  Current Outpatient Prescriptions on File Prior to Visit  Medication Sig  Dispense Refill  . acetaminophen (TYLENOL) 500 MG tablet Take 1,000 mg by mouth every 6 (six) hours as needed for moderate pain or headache.    . calcium carbonate (TUMS - DOSED IN MG ELEMENTAL CALCIUM) 500 MG chewable tablet Chew 2 tablets by mouth daily as needed for indigestion or heartburn.    . carboxymethylcellulose 1 % ophthalmic solution Apply 1 drop to eye 3 (three) times daily. 30 mL 12  . [employer] LUBRICANT EYE DROPS 0.4-0.3 % SOLN APPLY 1 DROP TO EYE 3 (THREE) TIMES DAILY.  12  . fluticasone (FLONASE) 50 MCG/ACT nasal spray Place 1 spray into both nostrils 2 (two) times daily. 16 g 0  . sodium chloride (OCEAN) 0.65 % SOLN nasal spray Place 2 sprays into both nostrils every 2 (two)  hours while awake.  0   No current facility-administered medications on file prior to visit.     BP 114/90   Pulse 81   Temp 98.1 F (36.7 C) (Oral)   Ht 5' 6.5" (1.689 m)   Wt 241 lb 6.4 oz (109.5 kg)   SpO2 97%   BMI 38.38 kg/m    Objective:   Physical Exam  Constitutional: She appears well-nourished.  Neck: Neck supple.  Cardiovascular: Normal rate and regular rhythm.   Pulmonary/Chest: Effort normal and breath sounds normal.  Skin: Skin is warm and dry.          Assessment & Plan:

## 2017-07-20 NOTE — Progress Notes (Signed)
[employer] insurance premium discount evaluation: Labs Drawn. Replacements ROI form signed. Tobacco Free Attestation form signed.  Forms placed in paper chart.  Labs on manual requisitions due to power outage. Not ordered in EPIC.   Okay to route results to pcp per pt.

## 2017-07-20 NOTE — Patient Instructions (Signed)
Continue taking losartan 50 mg (2 tablets) for high blood pressure. Take 2 tablets by mouth every morning.  Monitor your blood pressure and call me in 1 week if readings are at or above 135/90 on a consistent basis.   Schedule a follow up visit in 2 weeks for re-evaluation.  It was a pleasure to see you today!

## 2017-07-20 NOTE — Assessment & Plan Note (Signed)
Improved on 100 mg of losartan, will have her continue this and follow up for re-evaluation in 2 weeks. She will call in 1 week if BP readings are above 135/90 prior to her returning.

## 2017-07-22 LAB — CMP12+LP+TP+TSH+6AC+CBC/D/PLT
ALK PHOS: 129 IU/L — AB (ref 39–117)
ALT: 20 IU/L (ref 0–32)
AST: 17 IU/L (ref 0–40)
Albumin/Globulin Ratio: 1.9 (ref 1.2–2.2)
Albumin: 4.2 g/dL (ref 3.5–5.5)
BASOS ABS: 0.1 10*3/uL (ref 0.0–0.2)
BASOS: 2 %
BILIRUBIN TOTAL: 0.6 mg/dL (ref 0.0–1.2)
BUN / CREAT RATIO: 17 (ref 9–23)
BUN: 11 mg/dL (ref 6–24)
CALCIUM: 8.8 mg/dL (ref 8.7–10.2)
CHOL/HDL RATIO: 3.7 ratio (ref 0.0–4.4)
Chloride: 103 mmol/L (ref 96–106)
Cholesterol, Total: 185 mg/dL (ref 100–199)
Creatinine, Ser: 0.66 mg/dL (ref 0.57–1.00)
EOS (ABSOLUTE): 0.1 10*3/uL (ref 0.0–0.4)
EOS: 1 %
ESTIMATED CHD RISK: 0.7 times avg. (ref 0.0–1.0)
Free Thyroxine Index: 1.9 (ref 1.2–4.9)
GFR, EST AFRICAN AMERICAN: 117 mL/min/{1.73_m2} (ref >59)
GFR, EST NON AFRICAN AMERICAN: 102 mL/min/{1.73_m2} (ref >59)
GGT: 14 IU/L (ref 0–60)
GLUCOSE: 78 mg/dL (ref 65–99)
Globulin, Total: 2.2 g/dL (ref 1.5–4.5)
HDL: 50 mg/dL (ref >39)
HEMOGLOBIN: 14 g/dL (ref 11.1–15.9)
Hematocrit: 42.1 % (ref 34.0–46.6)
Immature Grans (Abs): 0 10*3/uL (ref 0.0–0.1)
Immature Granulocytes: 0 %
Iron: 84 ug/dL (ref 27–159)
LDH: 188 IU/L (ref 119–226)
LDL CALC: 117 mg/dL — AB (ref 0–99)
LYMPHS ABS: 2.1 10*3/uL (ref 0.7–3.1)
Lymphs: 32 %
MCH: 28.8 pg (ref 26.6–33.0)
MCHC: 33.3 g/dL (ref 31.5–35.7)
MCV: 87 fL (ref 79–97)
Monocytes Absolute: 0.6 10*3/uL (ref 0.1–0.9)
Monocytes: 10 %
NEUTROS ABS: 3.5 10*3/uL (ref 1.4–7.0)
Neutrophils: 55 %
PLATELETS: 197 10*3/uL (ref 150–379)
POTASSIUM: 4.4 mmol/L (ref 3.5–5.2)
Phosphorus: 2.5 mg/dL (ref 2.5–4.5)
RBC: 4.86 x10E6/uL (ref 3.77–5.28)
RDW: 14.8 % (ref 12.3–15.4)
Sodium: 139 mmol/L (ref 134–144)
T3 UPTAKE RATIO: 24 % (ref 24–39)
T4, Total: 8.1 ug/dL (ref 4.5–12.0)
TSH: 1.16 u[IU]/mL (ref 0.450–4.500)
Total Protein: 6.4 g/dL (ref 6.0–8.5)
Triglycerides: 89 mg/dL (ref 0–149)
Uric Acid: 4.4 mg/dL (ref 2.5–7.1)
VLDL Cholesterol Cal: 18 mg/dL (ref 5–40)
WBC: 6.3 10*3/uL (ref 3.4–10.8)

## 2017-07-22 LAB — HGB A1C W/O EAG: HEMOGLOBIN A1C: 5.3 % (ref 4.8–5.6)

## 2017-07-23 ENCOUNTER — Ambulatory Visit: Payer: Self-pay | Admitting: Primary Care

## 2017-07-23 NOTE — Progress Notes (Signed)
Results to be scanned in under media tab. Done on manual requisition and did not cross over to EPIC.  Alkaline phosphatase elevated, new. ALT returned to normal. Lipids much improved from previous year.  Remainder of labs unremarkable.   Pt seen by pcp 10/12 after be well appt. BP medication increased. F/u scheduled in 2 weeks with pcp.   Copy of labs unable to be provided to pt or routed to pcp due to printer issues. Will scan in and then route to pcp. Pt may pick up a copy in clinic if desired, later in week. No further questions/concerns.

## 2017-07-24 NOTE — Progress Notes (Signed)
After delay, lab results crossed over to Epic. PCP has already reviewed and entered notes for pt to view in [facility]. My no further intervention needed.

## 2017-07-31 ENCOUNTER — Ambulatory Visit (INDEPENDENT_AMBULATORY_CARE_PROVIDER_SITE_OTHER): Payer: No Typology Code available for payment source | Admitting: Primary Care

## 2017-07-31 ENCOUNTER — Encounter: Payer: Self-pay | Admitting: Primary Care

## 2017-07-31 VITALS — BP 120/94 | HR 75 | Temp 97.7°F | Ht 66.5 in | Wt 241.8 lb

## 2017-07-31 DIAGNOSIS — I10 Essential (primary) hypertension: Secondary | ICD-10-CM | POA: Diagnosis not present

## 2017-07-31 MED ORDER — LOSARTAN POTASSIUM 100 MG PO TABS: ORAL_TABLET | ORAL | 1 refills | Status: DC

## 2017-07-31 MED ORDER — HYDROCHLOROTHIAZIDE 12.5 MG PO TABS: ORAL_TABLET | ORAL | 0 refills | Status: DC

## 2017-07-31 NOTE — Progress Notes (Signed)
Subjective:    Patient ID: Diana Lutz, female    DOB: 13-Feb-1966, 51 y.o.   MRN: [id number]  HPI  [name] is a 51 year old female who presents today for follow up of hypertension. She is currently managed on losartan 100 mg which was increased during her last visit due to elevated blood pressure readings and symptoms of "feeling bad" with lightheadedness. Her BP last visit was improved on the increased dose of 100 mg of losartan.   Since her last visit she's not noticed much improvement compared to last visit. She's checking her BP at home and work and is getting readings of 130's-140's/90-100's. She's noticed some improvement to the lightheadedness/head pressure feeling but without resolve. She denies chest pain, dizziness, shortness of breath.  BP Readings from Last 3 Encounters:  07/31/17 (!) 120/94  07/20/17 114/90  07/20/17 (!) 127/98     Review of Systems  Eyes: Negative for visual disturbance.  Respiratory: Negative for shortness of breath.   Cardiovascular: Negative for chest pain.  Neurological: Positive for light-headedness and headaches. Negative for dizziness.       Past Medical History:  Diagnosis Date  . Abnormal Pap smear of cervix 1991  . Anemia    history of anemia  . Dysplasia of cervix    cervical ca  . GERD (gastroesophageal reflux disease)   . Hypertension   . Kidney stone   . Migraines      Social History   Social History  . Marital status: Married    Spouse name: Diana Lutz  . Number of children: Diana Lutz  . Years of education: Diana Lutz   Occupational History  . Not on file.   Social History Main Topics  . Smoking status: Never Smoker  . Smokeless tobacco: Never Used  . Alcohol use 0.0 oz/week     Comment: social  . Drug use: No  . Sexual activity: Not Currently   Other Topics Concern  . Not on file   Social History Narrative   Married.   3 children   Works for [employer] as Engineer, manufacturing systems.   Enjoys four wheeling, working on  the house    Past Surgical History:  Procedure Laterality Date  . ANTERIOR AND POSTERIOR REPAIR Diana Lutz 08/18/2015   Procedure: ANTERIOR (CYSTOCELE) AND POSTERIOR REPAIR (RECTOCELE)/Perineorrhaphy;  Surgeon: Diana Fallen, MD;  Location: [facility];  Service: Gynecology;  Laterality: Diana Lutz;  2 hrs.  Marland Kitchen BLADDER SUSPENSION Diana Lutz 08/18/2015   Procedure: TRANSVAGINAL TAPE (TVT) Mid-Urethral Sling PROCEDURE;  Surgeon: Diana Fallen, MD;  Location: [facility];  Service: Gynecology;  Laterality: Diana Lutz;  . CYSTOSCOPY Diana Lutz 08/18/2015   Procedure: CYSTOSCOPY;  Surgeon: Diana Fallen, MD;  Location: [facility];  Service: Gynecology;  Laterality: Diana Lutz;  . HYSTEROSCOPY W/ ENDOMETRIAL ABLATION    . LASER ABLATION OF THE CERVIX    . WISDOM TOOTH EXTRACTION      Family History  Problem Relation Age of Onset  . Hypertension Father   . Stroke Father   . Heart attack Father   . Prostate cancer Father   . Bladder Cancer Father   . Hypertension Mother   . Diverticulosis Mother   . Hypertension Brother   . Diabetes Brother     No Known Allergies  Current Outpatient Prescriptions on File Prior to Visit  Medication Sig Dispense Refill  . acetaminophen (TYLENOL) 500 MG tablet Take 1,000 mg by mouth every 6 (six) hours as needed for moderate pain or headache.    . calcium  carbonate (TUMS - DOSED IN MG ELEMENTAL CALCIUM) 500 MG chewable tablet Chew 2 tablets by mouth daily as needed for indigestion or heartburn.    . carboxymethylcellulose 1 % ophthalmic solution Apply 1 drop to eye 3 (three) times daily. 30 mL 12  . [facility] LUBRICANT EYE DROPS 0.4-0.3 % SOLN APPLY 1 DROP TO EYE 3 (THREE) TIMES DAILY.  12  . omeprazole (PRILOSEC) 20 MG capsule Take 1 capsule (20 mg total) by mouth daily. 90 capsule 1  . fluticasone (FLONASE) 50 MCG/ACT nasal spray Place 1 spray into both nostrils 2 (two) times daily. (Patient not taking: Reported on 07/31/2017) 16 g 0  . sodium chloride (OCEAN) 0.65 % SOLN nasal spray Place 2 sprays into both nostrils  every 2 (two) hours while awake. (Patient not taking: Reported on 07/31/2017)  0   No current facility-administered medications on file prior to visit.     BP (!) 120/94 (BP Location: Right Arm, Patient Position: Sitting, Cuff Size: Normal)   Pulse 75   Temp 97.7 F (36.5 C) (Oral)   Ht 5' 6.5" (1.689 m)   Wt 241 lb 12.8 oz (109.7 kg)   SpO2 97%   BMI 38.44 kg/m    Objective:   Physical Exam  Constitutional: She appears well-nourished.  Neck: Neck supple.  Cardiovascular: Normal rate and regular rhythm.   Pulmonary/Chest: Effort normal and breath sounds normal.  Skin: Skin is warm and dry.          Assessment & Plan:

## 2017-07-31 NOTE — Patient Instructions (Signed)
Continue losartan 100 mg tablets for high blood pressure.  Start hydrochlorothiazide 12.5 mg tablets for high blood pressure.  Please send me a message through My Chart in 2 weeks with your blood pressure readings.  It was a pleasure to see you today!   DASH Eating Plan DASH stands for "Dietary Approaches to Stop Hypertension." The DASH eating plan is a healthy eating plan that has been shown to reduce high blood pressure (hypertension). It may also reduce your risk for type 2 diabetes, heart disease, and stroke. The DASH eating plan may also help with weight loss. What are tips for following this plan? General guidelines  Avoid eating more than 2,300 mg (milligrams) of salt (sodium) a day. If you have hypertension, you may need to reduce your sodium intake to 1,500 mg a day.  Limit alcohol intake to no more than 1 drink a day for nonpregnant women and 2 drinks a day for men. One drink equals 12 oz of beer, 5 oz of wine, or 1 oz of hard liquor.  Work with your health care provider to maintain a healthy body weight or to lose weight. Ask what an ideal weight is for you.  Get at least 30 minutes of exercise that causes your heart to beat faster (aerobic exercise) most days of the week. Activities may include walking, swimming, or biking.  Work with your health care provider or diet and nutrition specialist (dietitian) to adjust your eating plan to your individual calorie needs. Reading food labels  Check food labels for the amount of sodium per serving. Choose foods with less than 5 percent of the Daily Value of sodium. Generally, foods with less than 300 mg of sodium per serving fit into this eating plan.  To find whole grains, look for the word "whole" as the first word in the ingredient list. Shopping  Buy products labeled as "low-sodium" or "no salt added."  Buy fresh foods. Avoid canned foods and premade or frozen meals. Cooking  Avoid adding salt when cooking. Use salt-free  seasonings or herbs instead of table salt or sea salt. Check with your health care provider or pharmacist before using salt substitutes.  Do not fry foods. Cook foods using healthy methods such as baking, boiling, grilling, and broiling instead.  Cook with heart-healthy oils, such as olive, canola, soybean, or sunflower oil. Meal planning   Eat a balanced diet that includes: ? 5 or more servings of fruits and vegetables each day. At each meal, try to fill half of your plate with fruits and vegetables. ? Up to 6-8 servings of whole grains each day. ? Less than 6 oz of lean meat, poultry, or fish each day. A 3-oz serving of meat is about the same size as a deck of cards. One egg equals 1 oz. ? 2 servings of low-fat dairy each day. ? A serving of nuts, seeds, or beans 5 times each week. ? Heart-healthy fats. Healthy fats called Omega-3 fatty acids are found in foods such as flaxseeds and coldwater fish, like sardines, salmon, and mackerel.  Limit how much you eat of the following: ? Canned or prepackaged foods. ? Food that is high in trans fat, such as fried foods. ? Food that is high in saturated fat, such as fatty meat. ? Sweets, desserts, sugary drinks, and other foods with added sugar. ? Full-fat dairy products.  Do not salt foods before eating.  Try to eat at least 2 vegetarian meals each week.  Eat more home-cooked  food and less restaurant, buffet, and fast food.  When eating at a restaurant, ask that your food be prepared with less salt or no salt, if possible. What foods are recommended? The items listed may not be a complete list. Talk with your dietitian about what dietary choices are best for you. Grains Whole-grain or whole-wheat bread. Whole-grain or whole-wheat pasta. Brown rice. [place]. Bulgur. Whole-grain and low-sodium cereals. Pita bread. Low-fat, low-sodium crackers. Whole-wheat flour tortillas. Vegetables Fresh or frozen vegetables (raw, steamed, roasted,  or grilled). Low-sodium or reduced-sodium tomato and vegetable juice. Low-sodium or reduced-sodium tomato sauce and tomato paste. Low-sodium or reduced-sodium canned vegetables. Fruits All fresh, dried, or frozen fruit. Canned fruit in natural juice (without added sugar). Meat and other protein foods Skinless chicken or [place]. Ground chicken or [place]. Pork with fat trimmed off. Fish and seafood. Egg whites. Dried beans, peas, or lentils. Unsalted nuts, nut butters, and seeds. Unsalted canned beans. Lean cuts of beef with fat trimmed off. Low-sodium, lean deli meat. Dairy Low-fat (1%) or fat-free (skim) milk. Fat-free, low-fat, or reduced-fat cheeses. Nonfat, low-sodium ricotta or cottage cheese. Low-fat or nonfat yogurt. Low-fat, low-sodium cheese. Fats and oils Soft margarine without trans fats. Vegetable oil. Low-fat, reduced-fat, or light mayonnaise and salad dressings (reduced-sodium). Canola, safflower, olive, soybean, and sunflower oils. Avocado. Seasoning and other foods Herbs. Spices. Seasoning mixes without salt. Unsalted popcorn and pretzels. Fat-free sweets. What foods are not recommended? The items listed may not be a complete list. Talk with your dietitian about what dietary choices are best for you. Grains Baked goods made with fat, such as croissants, muffins, or some breads. Dry pasta or rice meal packs. Vegetables Creamed or fried vegetables. Vegetables in a cheese sauce. Regular canned vegetables (not low-sodium or reduced-sodium). Regular canned tomato sauce and paste (not low-sodium or reduced-sodium). Regular tomato and vegetable juice (not low-sodium or reduced-sodium). Angie Fava. Olives. Fruits Canned fruit in a light or heavy syrup. Fried fruit. Fruit in cream or butter sauce. Meat and other protein foods Fatty cuts of meat. Ribs. Fried meat. Berniece Salines. Sausage. Bologna and other processed lunch meats. Salami. Fatback. Hotdogs. Bratwurst. Salted nuts and seeds. Canned beans  with added salt. Canned or smoked fish. Whole eggs or egg yolks. Chicken or [place] with skin. Dairy Whole or 2% milk, cream, and half-and-half. Whole or full-fat cream cheese. Whole-fat or sweetened yogurt. Full-fat cheese. Nondairy creamers. Whipped toppings. Processed cheese and cheese spreads. Fats and oils Butter. Stick margarine. Lard. Shortening. Ghee. Bacon fat. Tropical oils, such as coconut, palm kernel, or palm oil. Seasoning and other foods Salted popcorn and pretzels. Onion salt, garlic salt, seasoned salt, table salt, and sea salt. Worcestershire sauce. Tartar sauce. Barbecue sauce. Teriyaki sauce. Soy sauce, including reduced-sodium. Steak sauce. Canned and packaged gravies. Fish sauce. Oyster sauce. Cocktail sauce. Horseradish that you find on the shelf. Ketchup. Mustard. Meat flavorings and tenderizers. Bouillon cubes. Hot sauce and Tabasco sauce. Premade or packaged marinades. Premade or packaged taco seasonings. Relishes. Regular salad dressings. Where to find more information:  National Heart, Lung, and [place]: https://wilson-eaton.com/  American Heart Association: www.heart.org Summary  The DASH eating plan is a healthy eating plan that has been shown to reduce high blood pressure (hypertension). It may also reduce your risk for type 2 diabetes, heart disease, and stroke.  With the DASH eating plan, you should limit salt (sodium) intake to 2,300 mg a day. If you have hypertension, you may need to reduce your sodium intake to 1,500 mg  a day.  When on the DASH eating plan, aim to eat more fresh fruits and vegetables, whole grains, lean proteins, low-fat dairy, and heart-healthy fats.  Work with your health care provider or diet and nutrition specialist (dietitian) to adjust your eating plan to your individual calorie needs. This information is not intended to replace advice given to you by your health care provider. Make sure you discuss any questions you have with your health  care provider. Document Released: 09/14/2011 Document Revised: 09/18/2016 Document Reviewed: 09/18/2016 Elsevier Interactive Patient Education  2017 [employer].

## 2017-07-31 NOTE — Assessment & Plan Note (Signed)
Diastolic reading above goal. Continue losartan 100 mg. Add HCTZ 12.5 mg.  She will send [place] BP readings in 2 weeks via My Chart. Discussed DASH diet and need for weight loss.

## 2017-08-06 ENCOUNTER — Other Ambulatory Visit: Payer: Self-pay | Admitting: Primary Care

## 2017-08-06 DIAGNOSIS — Z1231 Encounter for screening mammogram for malignant neoplasm of breast: Secondary | ICD-10-CM

## 2017-08-09 ENCOUNTER — Ambulatory Visit
Admission: RE | Admit: 2017-08-09 | Discharge: 2017-08-09 | Disposition: A | Discharge status: Home / Self Care | Payer: No Typology Code available for payment source | Source: Ambulatory Visit | Attending: Primary Care | Admitting: Primary Care

## 2017-08-09 DIAGNOSIS — Z1231 Encounter for screening mammogram for malignant neoplasm of breast: Secondary | ICD-10-CM

## 2017-08-24 ENCOUNTER — Other Ambulatory Visit: Payer: Self-pay | Admitting: Primary Care

## 2017-08-24 DIAGNOSIS — I10 Essential (primary) hypertension: Secondary | ICD-10-CM

## 2017-08-24 MED ORDER — HYDROCHLOROTHIAZIDE 12.5 MG PO TABS: ORAL_TABLET | ORAL | 0 refills | Status: DC

## 2017-09-10 ENCOUNTER — Encounter: Payer: Self-pay | Admitting: Primary Care

## 2017-09-10 DIAGNOSIS — I10 Essential (primary) hypertension: Secondary | ICD-10-CM

## 2017-09-10 MED ORDER — LOSARTAN POTASSIUM-HCTZ 100-25 MG PO TABS: 1.0000 | ORAL_TABLET | Freq: Every day | ORAL | 0 refills | Status: DC

## 2017-09-21 ENCOUNTER — Other Ambulatory Visit: Payer: Self-pay | Admitting: Pulmonary Disease

## 2017-10-23 ENCOUNTER — Other Ambulatory Visit (INDEPENDENT_AMBULATORY_CARE_PROVIDER_SITE_OTHER): Payer: No Typology Code available for payment source

## 2017-10-23 DIAGNOSIS — I10 Essential (primary) hypertension: Secondary | ICD-10-CM

## 2017-10-24 LAB — COMPREHENSIVE METABOLIC PANEL
ALBUMIN: 4.2 g/dL (ref 3.5–5.2)
ALT: 19 U/L (ref 0–35)
AST: 23 U/L (ref 0–37)
Alkaline Phosphatase: 103 U/L (ref 39–117)
BUN: 11 mg/dL (ref 6–23)
CO2: 29 mEq/L (ref 19–32)
Calcium: 9.5 mg/dL (ref 8.4–10.5)
Chloride: 102 mEq/L (ref 96–112)
Creatinine, Ser: 0.69 mg/dL (ref 0.40–1.20)
GFR: 94.51 mL/min (ref >60.00)
Glucose, Bld: 88 mg/dL (ref 70–99)
POTASSIUM: 4.2 meq/L (ref 3.5–5.1)
SODIUM: 138 meq/L (ref 135–145)
Total Bilirubin: 0.5 mg/dL (ref 0.2–1.2)
Total Protein: 7.1 g/dL (ref 6.0–8.3)

## 2017-12-07 ENCOUNTER — Other Ambulatory Visit: Payer: Self-pay | Admitting: Primary Care

## 2017-12-07 DIAGNOSIS — I10 Essential (primary) hypertension: Secondary | ICD-10-CM

## 2017-12-27 ENCOUNTER — Other Ambulatory Visit: Payer: Self-pay | Admitting: Registered Nurse

## 2017-12-27 ENCOUNTER — Ambulatory Visit: Payer: Self-pay | Admitting: Registered Nurse

## 2017-12-27 VITALS — BP 130/93 | HR 79 | Temp 97.9°F | Resp 18

## 2017-12-27 DIAGNOSIS — H6123 Impacted cerumen, bilateral: Secondary | ICD-10-CM

## 2017-12-27 DIAGNOSIS — J209 Acute bronchitis, unspecified: Secondary | ICD-10-CM

## 2017-12-27 DIAGNOSIS — J0101 Acute recurrent maxillary sinusitis: Secondary | ICD-10-CM

## 2017-12-27 DIAGNOSIS — H65193 Other acute nonsuppurative otitis media, bilateral: Secondary | ICD-10-CM

## 2017-12-27 DIAGNOSIS — S61213A Laceration without foreign body of left middle finger without damage to nail, initial encounter: Secondary | ICD-10-CM

## 2017-12-27 MED ORDER — FLUTICASONE PROPIONATE 50 MCG/ACT NA SUSP: 1.0000 | Freq: Two times a day (BID) | NASAL | 0 refills | Status: DC

## 2017-12-27 MED ORDER — ALBUTEROL SULFATE 108 (90 BASE) MCG/ACT IN AEPB: 1.0000 | INHALATION_SPRAY | RESPIRATORY_TRACT | 0 refills | Status: DC | PRN

## 2017-12-27 MED ORDER — BENZONATATE 200 MG PO CAPS: 200.0000 mg | ORAL_CAPSULE | Freq: Two times a day (BID) | ORAL | 0 refills | Status: AC | PRN

## 2017-12-27 NOTE — Progress Notes (Signed)
Subjective:    Patient ID: Diana Lutz, female    DOB: 06/09/1966, 52 y.o.   MRN: [id number]  51y/o caucasian female established patient here for sometimes productive cough, bilateral ear pain and sinus/pain pressure and my voice coming and going.  I think I had a fever this weekend.  PMHx seasonal allergies.  + sick contacts viral URI at work  Denied wheezing  Also cut her left 3rd finger with razor blade at work earlier today washed with soap and water bleeding stopping not having much pain  She thinks last tetanus 6 years ago by [employer]     Review of Systems  Constitutional: Positive for fever. Negative for activity change, appetite change, chills, diaphoresis, fatigue and unexpected weight change.  HENT: Positive for congestion, ear pain, postnasal drip, sinus pressure, sinus pain and voice change. Negative for dental problem, drooling, ear discharge, facial swelling, hearing loss, mouth sores, nosebleeds, rhinorrhea, sneezing, sore throat, tinnitus and trouble swallowing.   Eyes: Negative for photophobia, pain, discharge, redness, itching and visual disturbance.  Respiratory: Positive for cough. Negative for choking, chest tightness, shortness of breath, wheezing and stridor.   Cardiovascular: Negative for chest pain, palpitations and leg swelling.  Gastrointestinal: Negative for abdominal pain, blood in stool, diarrhea, nausea and vomiting.  Endocrine: Negative for cold intolerance and heat intolerance.  Genitourinary: Negative for difficulty urinating, dysuria and hematuria.  Musculoskeletal: Negative for arthralgias, back pain, gait problem, joint swelling, myalgias, neck pain and neck stiffness.  Skin: Negative for color change, pallor, rash and wound.  Allergic/Immunologic: Positive for environmental allergies. Negative for food allergies.  Neurological: Negative for dizziness, tremors, seizures, syncope, facial asymmetry, speech difficulty, weakness, light-headedness,  numbness and headaches.  Hematological: Negative for adenopathy. Does not bruise/bleed easily.  Psychiatric/Behavioral: Negative for agitation, confusion and sleep disturbance.       Objective:   Physical Exam  Constitutional: She is oriented to person, place, and time. She appears well-developed and well-nourished. She is active and cooperative.  Non-toxic appearance. She does not have a sickly appearance. She appears ill. No distress.  HENT:  Head: Normocephalic and atraumatic.  Right Ear: Hearing, external ear and ear canal normal. Tympanic membrane is erythematous and bulging. A middle ear effusion is present.  Left Ear: Hearing, external ear and ear canal normal. Tympanic membrane is erythematous and bulging. A middle ear effusion is present.  Nose: Mucosal edema and rhinorrhea present. No nose lacerations, sinus tenderness, nasal deformity, septal deviation or nasal septal hematoma. No epistaxis.  No foreign bodies. Right sinus exhibits maxillary sinus tenderness and frontal sinus tenderness. Left sinus exhibits maxillary sinus tenderness and frontal sinus tenderness.  Mouth/Throat: Uvula is midline and mucous membranes are normal. Mucous membranes are not pale, not dry and not cyanotic. No oral lesions. No trismus in the jaw. Normal dentition. No dental abscesses, uvula swelling, lacerations or dental caries. Posterior oropharyngeal edema and posterior oropharyngeal erythema present. No oropharyngeal exudate or tonsillar abscesses.  Gold soft yellow wax occluding TMs on initial exam ear currettage performed bilaterally removed 95% cerumen; bilateral allergic shiners; cobblestoning posterior pharynx; bilateral TMs bulging, erythema, opacity air fluid level 50%; nasal turbinates edema/erythema and nasal hairs with dried hard mucous yellow; maxillary greater than frontal sinus TTP left greater than right  Eyes: Pupils are equal, round, and reactive to light. Conjunctivae, EOM and lids are normal.  Right eye exhibits no chemosis, no discharge, no exudate and no hordeolum. No foreign body present in the right eye. Left eye  exhibits no chemosis, no discharge, no exudate and no hordeolum. No foreign body present in the left eye. Right conjunctiva is not injected. Right conjunctiva has no hemorrhage. Left conjunctiva is not injected. Left conjunctiva has no hemorrhage. No scleral icterus. Right eye exhibits normal extraocular motion and no nystagmus. Left eye exhibits normal extraocular motion and no nystagmus. Right pupil is round and reactive. Left pupil is round and reactive. Pupils are equal.  Neck: Trachea normal, normal range of motion and phonation normal. Neck supple. No tracheal tenderness and no muscular tenderness present. No neck rigidity. No tracheal deviation, no edema, no erythema and normal range of motion present. No thyroid mass and no thyromegaly present.  Cardiovascular: Normal rate, regular rhythm, S1 normal, S2 normal, normal heart sounds and intact distal pulses. PMI is not displaced. Exam reveals no gallop, no distant heart sounds and no friction rub.  No murmur heard. Pulmonary/Chest: Effort normal and breath sounds normal. No accessory muscle usage or stridor. No respiratory distress. She has no decreased breath sounds. She has no wheezes. She has no rhonchi. She has no rales. She exhibits no tenderness.  Nonproductive intermittent cough in exam room; patient spoke full sentences without difficulty  Abdominal: Soft. Normal appearance. She exhibits no distension, no fluid wave and no ascites. There is no rigidity and no guarding.  Musculoskeletal: Normal range of motion. She exhibits no edema.       Right shoulder: Normal.       Left shoulder: Normal.       Right elbow: Normal.      Left elbow: Normal.       Left wrist: Normal.       Right hip: Normal.       Left hip: Normal.       Right knee: Normal.       Left knee: Normal.       Cervical back: Normal.       Thoracic  back: Normal.       Lumbar back: Normal.       Right hand: Normal.       Left hand: She exhibits tenderness and laceration. She exhibits normal range of motion, no bony tenderness, normal two-point discrimination, normal capillary refill and no swelling. Normal sensation noted. Normal strength noted. She exhibits no finger abduction and no thumb/finger opposition.       Hands: Lymphadenopathy:       Head (right side): No submental, no submandibular, no tonsillar, no preauricular, no posterior auricular and no occipital adenopathy present.       Head (left side): No submental, no submandibular, no tonsillar, no preauricular, no posterior auricular and no occipital adenopathy present.    She has no cervical adenopathy.       Right cervical: No superficial cervical, no deep cervical and no posterior cervical adenopathy present.      Left cervical: No superficial cervical, no deep cervical and no posterior cervical adenopathy present.  Neurological: She is alert and oriented to person, place, and time. She has normal strength. She is not disoriented. She displays no atrophy and no tremor. No cranial nerve deficit or sensory deficit. She exhibits normal muscle tone. She displays no seizure activity. Coordination and gait normal. GCS eye subscore is 4. GCS verbal subscore is 5. GCS motor subscore is 6.  In/out of chair;on/off exam table without difficulty/ gait sure and steady in hallway  Skin: Skin is warm and dry. Laceration noted. No abrasion, no bruising, no burn, no ecchymosis,  no lesion, no petechiae and no rash noted. She is not diaphoretic. No cyanosis or erythema. No pallor. Nails show no clubbing.     Between MCP and PIP joints not extending into joint line 2cm shallow linear laceration palmar 3rd digit left dry wound edges well approximated removed taped piece of paper towel to evaluate no drainage or blood on towel; cleansed with iodine and alcohol wipes then applied triple antibiotic ointment  and bandaid; slightly TTP  Psychiatric: She has a normal mood and affect. Her speech is normal and behavior is normal. Judgment and thought content normal. Cognition and memory are normal.  Nursing note and vitals reviewed.         Assessment & Plan:  A-bilateral otitis media acute; bilateral cerumen impaction; acute bronchitis, acute sinusitis recurrent maxillary, laceration finger P-Augmentin 875mg  po BID x 10 days #20 RF0 dispensed from [facility].  Supportive treatment.   No evidence of invasive bacterial infection, non toxic and well hydrated.  This is most likely self limiting viral infection.  I do not see where any further testing or imaging is necessary at this time.   I will suggest supportive care, rest, good hygiene and encourage the patient to take adequate fluids.  The patient is to return to clinic or EMERGENCY ROOM if symptoms worsen or change significantly e.g. ear pain, fever, purulent discharge from ears or bleeding.  Exitcare handout on otitis media  given to patient.  Patient verbalized agreement and understanding of treatment plan.    Phenylephrine 10mg  po q6h prn rhinitis given 4 UD from clinic stock discussed may increase blood pressure, drowsiness, insomnia.  Stop if visual changes, worst headache of her life.  Patient may use normal saline nasal spray 2 sprays each nostril q2h wa as needed 1 bottle given from clinic stock. flonase 109mcg 1 spray each nostril BID #1 RF6 Electronic Rx to her preferred pharmacy.  Patient denied personal or family history of ENT cancer.  OTC antihistamine of choice claritin/zyrtec 10mg  po daily.  Avoid triggers if possible.  Shower prior to bedtime if exposed to triggers.  If allergic dust/dust mites recommend mattress/pillow covers/encasements; washing linens, vacuuming, sweeping, dusting weekly.  Call or return to clinic as needed if these symptoms worsen or fail to improve as anticipated.   Exitcare handout on allergic rhinitis and sinus rinse given to  patient.  Patient verbalized understanding of instructions, agreed with plan of care and had no further questions at this time.  P2:  Avoidance and hand washing.   Cut finger left 3rd with razor cleaned with soap and water last tdap she thinks 6 years ago by [employer]  Discussed she needs  Tdap booster vaccination as greater than 5 years since last tetanus; will notify RN Hildred Alamin to administer once available in clinic  Patient was instructed to rest, ice and elevate left hand.  Do not soak hand until laceration healed avoid pool, lake, hot tub, dirty sink water.  Exitcare handout on  Laceration given to patient.   Topical triple antibiotic BID until healed given 7 UD from clinic stock along with bandaids for dressing changes.  Cleanse with soap and water twice a day and prn soiling.  Medications as directed.  Call or return to clinic as needed if these symptoms worsen or fail to improve as anticipated  Discussed augmentin for ear infections also covers common skin infections.  Tdap booster recommended as last vaccination 2013 currently none available in clinic RN Hildred Alamin will obtain and patient to  return tomorrow for Tdap booster  Patient verbalized agreement and understanding of treatment plan.  P2:  ROM, injury prevention  Restart flonase 1 spray each nostril BID, saline 2 sprays each nostril q2h wa prn congestion. Cross coverage with augmentin 875mg  po BID x 10 days #20 RF0 for otitis media will help her sinuses also dispensed from [facility]  Denied personal or family history of ENT cancer.  Shower BID especially prior to bed. No evidence of systemic bacterial infection, non toxic and well hydrated.  I do not see where any further testing or imaging is necessary at this time.   I will suggest supportive care, rest, good hygiene and encourage the patient to take adequate fluids.  The patient is to return to clinic or EMERGENCY ROOM if symptoms worsen or change significantly.  Exitcare handout on sinusitis and sinus  rinse given to patient.  Patient verbalized agreement and understanding of treatment plan and had no further questions at this time.   P2:  Hand washing and cover cough  Electronic Rx Tessalon pearles 200mg  po TID prn cough #30 RF0 to her preferred pharmacy. Cough lozenges po q2h prn cough given 8 UD from clinic stock.  Patient did not want prednisone taper  Electronic Rx discussed if chest tightness/wheezing/protracted cought pick up Albuterol  29mcg 1-2 puffs po q4-6h prn protracted cough/wheeze #1 RF0 side effect increased heart rate. Bronchitis simple, community acquired, may have started as viral (probably respiratory syncytial, parainfluenza, influenza, or adenovirus), but now evidence of acute purulent bronchitis with resultant bronchial edema and mucus formation.  Viruses are the most common cause of bronchial inflammation in otherwise healthy adults with acute bronchitis.  The appearance of sputum is not predictive of whether a bacterial infection is present.  Purulent sputum is most often caused by viral infections.  There are a small portion of those caused by non-viral agents being Mycoplama pneumonia.  Microscopic examination or C&S of sputum in the healthy adult with acute bronchitis is generally not helpful (usually negative or normal respiratory flora) other considerations being cough from upper respiratory tract infections, sinusitis or allergic syndromes (mild asthma or viral pneumonia).  Differential Diagnoses:  reactive airway disease (asthma, allergic aspergillosis (eosinophilia), chronic bronchitis, respiratory infection (sinusitis, common cold, pneumonia), congestive heart failure, reflux esophagitis, bronchogenic tumor, aspiration syndromes and/or exposure to pulmonary irritants/smoke.  Without high fever, severe dyspnea, lack of physical findings or other risk factors, I will hold on a chest radiograph and CBC at this time.  I discussed that approximately 50% of patients with acute  bronchitis have a cough that lasts up to three weeks, and 25% for over a month.  Tylenol 500mg  one to two tablets every four to six hours as needed for fever or myalgias.  No aspirin. Exitcare handout on bronchitis and inhaler use given to patient.  ER if hemopthysis, SOB, worst chest pain of life.   Patient instructed to follow up prn if no resolution symptoms with plan of care/sooner if symptoms worsen.  Patient verbalized agreement and understanding of treatment plan.  P2:  hand washing and cover coughAugmentin, tessalon, flonase, albuterol restart antihistamine gave her bottle of saline

## 2017-12-28 ENCOUNTER — Encounter: Payer: Self-pay | Admitting: Registered Nurse

## 2017-12-28 MED ORDER — SALINE SPRAY 0.65 % NA SOLN: 2.0000 | NASAL | 0 refills | Status: DC

## 2017-12-28 MED ORDER — AMOXICILLIN-POT CLAVULANATE 875-125 MG PO TABS: 1.0000 | ORAL_TABLET | Freq: Two times a day (BID) | ORAL | 0 refills | Status: AC

## 2017-12-28 NOTE — Patient Instructions (Addendum)
How to Use a Metered Dose Inhaler A metered dose inhaler is a handheld device for taking medicine that must be breathed into the lungs (inhaled). The device can be used to deliver a variety of inhaled medicines, including:  Quick relief or rescue medicines, such as bronchodilators.  Controller medicines, such as corticosteroids.  The medicine is delivered by pushing down on a metal canister to release a preset amount of spray and medicine. Each device contains the amount of medicine that is needed for a preset number of uses (inhalations). Your health care provider may recommend that you use a spacer with your inhaler to help you take the medicine more effectively. A spacer is a plastic tube with a mouthpiece on one end and an opening that connects to the inhaler on the other end. A spacer holds the medicine in a tube for a short time, which allows you to inhale more medicine. What are the risks? If you do not use your inhaler correctly, medicine might not reach your lungs to help you breathe. Inhaler medicine can cause side effects, such as:  Mouth or throat infection.  Cough.  Hoarseness.  Headache.  Nausea and vomiting.  Lung infection (pneumonia) in people who have a lung condition called COPD.  How to use a metered dose inhaler without a spacer 1. Remove the cap from the inhaler. 2. If you are using the inhaler for the first time, shake it for 5 seconds, turn it away from your face, then release 4 puffs into the air. This is called priming. 3. Shake the inhaler for 5 seconds. 4. Position the inhaler so the top of the canister faces up. 5. Put your index finger on the top of the medicine canister. Support the bottom of the inhaler with your thumb. 6. Breathe out normally and as completely as possible, away from the inhaler. 7. Either place the inhaler between your teeth and close your lips tightly around the mouthpiece, or hold the inhaler 1-2 inches (2.5-5 cm) away from your open  mouth. Keep your tongue down out of the way. If you are unsure which technique to use, ask your health care provider. 8. Press the canister down with your index finger to release the medicine, then inhale deeply and slowly through your mouth (not your nose) until your lungs are completely filled. Inhaling should take 4-6 seconds. 9. Hold the medicine in your lungs for 5-10 seconds (10 seconds is best). This helps the medicine get into the small airways of your lungs. 10. With your lips in a tight circle (pursed), breathe out slowly. 11. Repeat steps 3-10 until you have taken the number of puffs that your health care provider directed. Wait about 1 minute between puffs or as directed. 12. Put the cap on the inhaler. 13. If you are using a steroid inhaler, rinse your mouth with water, gargle, and spit out the water. Do not swallow the water. How to use a metered dose inhaler with a spacer 1. Remove the cap from the inhaler. 2. If you are using the inhaler for the first time, shake it for 5 seconds, turn it away from your face, then release 4 puffs into the air. This is called priming. 3. Shake the inhaler for 5 seconds. 4. Place the open end of the spacer onto the inhaler mouthpiece. 5. Position the inhaler so the top of the canister faces up and the spacer mouthpiece faces you. 6. Put your index finger on the top of the medicine canister.  Support the bottom of the inhaler and the spacer with your thumb. 7. Breathe out normally and as completely as possible, away from the spacer. 8. Place the spacer between your teeth and close your lips tightly around it. Keep your tongue down out of the way. 9. Press the canister down with your index finger to release the medicine, then inhale deeply and slowly through your mouth (not your nose) until your lungs are completely filled. Inhaling should take 4-6 seconds. 10. Hold the medicine in your lungs for 5-10 seconds (10 seconds is best). This helps the medicine  get into the small airways of your lungs. 11. With your lips in a tight circle (pursed), breathe out slowly. 12. Repeat steps 3-11 until you have taken the number of puffs that your health care provider directed. Wait about 1 minute between puffs or as directed. 13. Remove the spacer from the inhaler and put the cap on the inhaler. 14. If you are using a steroid inhaler, rinse your mouth with water, gargle, and spit out the water. Do not swallow the water. Follow these instructions at home:  Take your inhaled medicine only as told by your health care provider. Do not use the inhaler more than directed by your health care provider.  Keep all follow-up visits as told by your health care provider. This is important.  If your inhaler has a counter, you can check it to determine how full your inhaler is. If your inhaler does not have a counter, ask your health care provider when you will need to refill your inhaler and write the refill date on a calendar or on your inhaler canister. Note that you cannot know when an inhaler is empty by shaking it.  Follow directions on the package insert for care and cleaning of your inhaler and spacer. Contact a health care provider if:  Symptoms are only partially relieved with your inhaler.  You are having trouble using your inhaler.  You have an increase in phlegm.  You have headaches. Get help right away if:  You feel little or no relief after using your inhaler.  You have dizziness.  You have a fast heart rate.  You have chills or a fever.  You have night sweats.  There is blood in your phlegm. Summary  A metered dose inhaler is a handheld device for taking medicine that must be breathed into the lungs (inhaled).  The medicine is delivered by pushing down on a metal canister to release a preset amount of spray and medicine.  Each device contains the amount of medicine that is needed for a preset number of uses (inhalations). This  information is not intended to replace advice given to you by your health care provider. Make sure you discuss any questions you have with your health care provider. Document Released: 09/25/2005 Document Revised: 08/15/2016 Document Reviewed: 08/15/2016 Elsevier Interactive Patient Education  2017 [employer] Acute Bronchitis, Adult Acute bronchitis is sudden (acute) swelling of the air tubes (bronchi) in the lungs. Acute bronchitis causes these tubes to fill with mucus, which can make it hard to breathe. It can also cause coughing or wheezing. In adults, acute bronchitis usually goes away within 2 weeks. A cough caused by bronchitis may last up to 3 weeks. Smoking, allergies, and asthma can make the condition worse. Repeated episodes of bronchitis may cause further lung problems, such as chronic obstructive pulmonary disease (COPD). What are the causes? This condition can be caused by germs and by substances  that irritate the lungs, including:  Cold and flu viruses. This condition is most often caused by the same virus that causes a cold.  Bacteria.  Exposure to tobacco smoke, dust, fumes, and air pollution.  What increases the risk? This condition is more likely to develop in people who:  Have close contact with someone with acute bronchitis.  Are exposed to lung irritants, such as tobacco smoke, dust, fumes, and vapors.  Have a weak immune system.  Have a respiratory condition such as asthma.  What are the signs or symptoms? Symptoms of this condition include:  A cough.  Coughing up clear, yellow, or green mucus.  Wheezing.  Chest congestion.  Shortness of breath.  A fever.  Body aches.  Chills.  A sore throat.  How is this diagnosed? This condition is usually diagnosed with a physical exam. During the exam, your health care provider may order tests, such as chest X-rays, to rule out other conditions. He or she may also:  Test a sample of your mucus for  bacterial infection.  Check the level of oxygen in your blood. This is done to check for pneumonia.  Do a chest X-ray or lung function testing to rule out pneumonia and other conditions.  Perform blood tests.  Your health care provider will also ask about your symptoms and medical history. How is this treated? Most cases of acute bronchitis clear up over time without treatment. Your health care provider may recommend:  Drinking more fluids. Drinking more makes your mucus thinner, which may make it easier to breathe.  Taking a medicine for a fever or cough.  Taking an antibiotic medicine.  Using an inhaler to help improve shortness of breath and to control a cough.  Using a cool mist vaporizer or humidifier to make it easier to breathe.  Follow these instructions at home: Medicines  Take over-the-counter and prescription medicines only as told by your health care provider.  If you were prescribed an antibiotic, take it as told by your health care provider. Do not stop taking the antibiotic even if you start to feel better. General instructions  Get plenty of rest.  Drink enough fluids to keep your urine clear or pale yellow.  Avoid smoking and secondhand smoke. Exposure to cigarette smoke or irritating chemicals will make bronchitis worse. If you smoke and you need help quitting, ask your health care provider. Quitting smoking will help your lungs heal faster.  Use an inhaler, cool mist vaporizer, or humidifier as told by your health care provider.  Keep all follow-up visits as told by your health care provider. This is important. How is this prevented? To lower your risk of getting this condition again:  Wash your hands often with soap and water. If soap and water are not available, use hand sanitizer.  Avoid contact with people who have cold symptoms.  Try not to touch your hands to your mouth, nose, or eyes.  Make sure to get the flu shot every year.  Contact a  health care provider if:  Your symptoms do not improve in 2 weeks of treatment. Get help right away if:  You cough up blood.  You have chest pain.  You have severe shortness of breath.  You become dehydrated.  You faint or keep feeling like you are going to faint.  You keep vomiting.  You have a severe headache.  Your fever or chills gets worse. This information is not intended to replace advice given to you by  your health care provider. Make sure you discuss any questions you have with your health care provider. Document Released: 11/02/2004 Document Revised: 04/19/2016 Document Reviewed: 03/15/2016 Elsevier Interactive Patient Education  2018 [employer], Adult The ears produce a substance called earwax that helps keep bacteria out of the ear and protects the skin in the ear canal. Occasionally, earwax can build up in the ear and cause discomfort or hearing loss. What increases the risk? This condition is more likely to develop in people who:  Are female.  Are elderly.  Naturally produce more earwax.  Clean their ears often with cotton swabs.  Use earplugs often.  Use in-ear headphones often.  Wear hearing aids.  Have narrow ear canals.  Have earwax that is overly thick or sticky.  Have eczema.  Are dehydrated.  Have excess hair in the ear canal.  What are the signs or symptoms? Symptoms of this condition include:  Reduced or muffled hearing.  A feeling of fullness in the ear or feeling that the ear is plugged.  Fluid coming from the ear.  Ear pain.  Ear itch.  Ringing in the ear.  Coughing.  An obvious piece of earwax that can be seen inside the ear canal.  How is this diagnosed? This condition may be diagnosed based on:  Your symptoms.  Your medical history.  An ear exam. During the exam, your health care provider will look into your ear with an instrument called an otoscope.  You may have tests, including a hearing  test. How is this treated? This condition may be treated by:  Using ear drops to soften the earwax.  Having the earwax removed by a health care provider. The health care provider may: ? Flush the ear with water. ? Use an instrument that has a loop on the end (curette). ? Use a suction device.  Surgery to remove the wax buildup. This may be done in severe cases.  Follow these instructions at home:  Take over-the-counter and prescription medicines only as told by your health care provider.  Do not put any objects, including cotton swabs, into your ear. You can clean the opening of your ear canal with a washcloth or facial tissue.  Follow instructions from your health care provider about cleaning your ears. Do not over-clean your ears.  Drink enough fluid to keep your urine clear or pale yellow. This will help to thin the earwax.  Keep all follow-up visits as told by your health care provider. If earwax builds up in your ears often or if you use hearing aids, consider seeing your health care provider for routine, preventive ear cleanings. Ask your health care provider how often you should schedule your cleanings.  If you have hearing aids, clean them according to instructions from the manufacturer and your health care provider. Contact a health care provider if:  You have ear pain.  You develop a fever.  You have blood, pus, or other fluid coming from your ear.  You have hearing loss.  You have ringing in your ears that does not go away.  Your symptoms do not improve with treatment.  You feel like the room is spinning (vertigo). Summary  Earwax can build up in the ear and cause discomfort or hearing loss.  The most common symptoms of this condition include reduced or muffled hearing and a feeling of fullness in the ear or feeling that the ear is plugged.  This condition may be diagnosed based on your symptoms,  your medical history, and an ear exam.  This condition may be  treated by using ear drops to soften the earwax or by having the earwax removed by a health care provider.  Do not put any objects, including cotton swabs, into your ear. You can clean the opening of your ear canal with a washcloth or facial tissue. This information is not intended to replace advice given to you by your health care provider. Make sure you discuss any questions you have with your health care provider. Document Released: 11/02/2004 Document Revised: 12/06/2016 Document Reviewed: 12/06/2016 Elsevier Interactive Patient Education  2018 [employer]. Otitis Media, Adult Otitis media occurs when there is inflammation and fluid in the middle ear. Your middle ear is a part of the ear that contains bones for hearing as well as air that helps send sounds to your brain. What are the causes? This condition is caused by a blockage in the eustachian tube. This tube drains fluid from the ear to the back of the nose (nasopharynx). A blockage in this tube can be caused by an object or by swelling (edema) in the tube. Problems that can cause a blockage include:  A cold or other upper respiratory infection.  Allergies.  An irritant, such as tobacco smoke.  Enlarged adenoids. The adenoids are areas of soft tissue located high in the back of the throat, behind the nose and the roof of the mouth.  A mass in the nasopharynx.  Damage to the ear caused by pressure changes (barotrauma).  What are the signs or symptoms? Symptoms of this condition include:  Ear pain.  A fever.  Decreased hearing.  A headache.  Tiredness (lethargy).  Fluid leaking from the ear.  Ringing in the ear.  How is this diagnosed? This condition is diagnosed with a physical exam. During the exam your health care provider will use an instrument called an otoscope to look into your ear and check for redness, swelling, and fluid. He or she will also ask about your symptoms. Your health care provider may also order  tests, such as:  A test to check the movement of the eardrum (pneumatic otoscopy). This test is done by squeezing a small amount of air into the ear.  A test that changes air pressure in the middle ear to check how well the eardrum moves and whether the eustachian tube is working (tympanogram).  How is this treated? This condition usually goes away on its own within 3-5 days. But if the condition is caused by a bacteria infection and does not go away own its own, or keeps coming back, your health care provider may:  Prescribe antibiotic medicines to treat the infection.  Prescribe or recommend medicines to control pain.  Follow these instructions at home:  Take over-the-counter and prescription medicines only as told by your health care provider.  If you were prescribed an antibiotic medicine, take it as told by your health care provider. Do not stop taking the antibiotic even if you start to feel better.  Keep all follow-up visits as told by your health care provider. This is important. Contact a health care provider if:  You have bleeding from your nose.  There is a lump on your neck.  You are not getting better in 5 days.  You feel worse instead of better. Get help right away if:  You have severe pain that is not controlled with medicine.  You have swelling, redness, or pain around your ear.  You have  stiffness in your neck.  A part of your face is paralyzed.  The bone behind your ear (mastoid) is tender when you touch it.  You develop a severe headache. Summary  Otitis media is redness, soreness, and swelling of the middle ear.  This condition usually goes away on its own within 3-5 days.  If the problem does not go away in 3-5 days, your health care provider may prescribe or recommend medicines to treat your symptoms.  If you were prescribed an antibiotic medicine, take it as told by your health care provider. This information is not intended to replace advice  given to you by your health care provider. Make sure you discuss any questions you have with your health care provider. Document Released: 06/30/2004 Document Revised: 09/15/2016 Document Reviewed: 09/15/2016 Elsevier Interactive Patient Education  2018 [employer]. Sinusitis, Adult Sinusitis is soreness and inflammation of your sinuses. Sinuses are hollow spaces in the bones around your face. Your sinuses are located:  Around your eyes.  In the middle of your forehead.  Behind your nose.  In your cheekbones.  Your sinuses and nasal passages are lined with a stringy fluid (mucus). Mucus normally drains out of your sinuses. When your nasal tissues become inflamed or swollen, the mucus can become trapped or blocked so air cannot flow through your sinuses. This allows bacteria, viruses, and funguses to grow, which leads to infection. Sinusitis can develop quickly and last for 7?10 days (acute) or for more than 12 weeks (chronic). Sinusitis often develops after a cold. What are the causes? This condition is caused by anything that creates swelling in the sinuses or stops mucus from draining, including:  Allergies.  Asthma.  Bacterial or viral infection.  Abnormally shaped bones between the nasal passages.  Nasal growths that contain mucus (nasal polyps).  Narrow sinus openings.  Pollutants, such as chemicals or irritants in the air.  A foreign object stuck in the nose.  A fungal infection. This is rare.  What increases the risk? The following factors may make you more likely to develop this condition:  Having allergies or asthma.  Having had a recent cold or respiratory tract infection.  Having structural deformities or blockages in your nose or sinuses.  Having a weak immune system.  Doing a lot of swimming or diving.  Overusing nasal sprays.  Smoking.  What are the signs or symptoms? The main symptoms of this condition are pain and a feeling of pressure around  the affected sinuses. Other symptoms include:  Upper toothache.  Earache.  Headache.  Bad breath.  Decreased sense of smell and taste.  A cough that may get worse at night.  Fatigue.  Fever.  Thick drainage from your nose. The drainage is often green and it may contain pus (purulent).  Stuffy nose or congestion.  Postnasal drip. This is when extra mucus collects in the throat or back of the nose.  Swelling and warmth over the affected sinuses.  Sore throat.  Sensitivity to light.  How is this diagnosed? This condition is diagnosed based on symptoms, a medical history, and a physical exam. To find out if your condition is acute or chronic, your health care provider may:  Look in your nose for signs of nasal polyps.  Tap over the affected sinus to check for signs of infection.  View the inside of your sinuses using an imaging device that has a light attached (endoscope).  If your health care provider suspects that you have chronic sinusitis,  you may also:  Be tested for allergies.  Have a sample of mucus taken from your nose (nasal culture) and checked for bacteria.  Have a mucus sample examined to see if your sinusitis is related to an allergy.  If your sinusitis does not respond to treatment and it lasts longer than 8 weeks, you may have an MRI or CT scan to check your sinuses. These scans also help to determine how severe your infection is. In rare cases, a bone biopsy may be done to rule out more serious types of fungal sinus disease. How is this treated? Treatment for sinusitis depends on the cause and whether your condition is chronic or acute. If a virus is causing your sinusitis, your symptoms will go away on their own within 10 days. You may be given medicines to relieve your symptoms, including:  Topical nasal decongestants. They shrink swollen nasal passages and let mucus drain from your sinuses.  Antihistamines. These drugs block inflammation that is  triggered by allergies. This can help to ease swelling in your nose and sinuses.  Topical nasal corticosteroids. These are nasal sprays that ease inflammation and swelling in your nose and sinuses.  Nasal saline washes. These rinses can help to get rid of thick mucus in your nose.  If your condition is caused by bacteria, you will be given an antibiotic medicine. If your condition is caused by a fungus, you will be given an antifungal medicine. Surgery may be needed to correct underlying conditions, such as narrow nasal passages. Surgery may also be needed to remove polyps. Follow these instructions at home: Medicines  Take, use, or apply over-the-counter and prescription medicines only as told by your health care provider. These may include nasal sprays.  If you were prescribed an antibiotic medicine, take it as told by your health care provider. Do not stop taking the antibiotic even if you start to feel better. Hydrate and Humidify  Drink enough water to keep your urine clear or pale yellow. Staying hydrated will help to thin your mucus.  Use a cool mist humidifier to keep the humidity level in your home above 50%.  Inhale steam for 10-15 minutes, 3-4 times a day or as told by your health care provider. You can do this in the bathroom while a hot shower is running.  Limit your exposure to cool or dry air. Rest  Rest as much as possible.  Sleep with your head raised (elevated).  Make sure to get enough sleep each night. General instructions  Apply a warm, moist washcloth to your face 3-4 times a day or as told by your health care provider. This will help with discomfort.  Wash your hands often with soap and water to reduce your exposure to viruses and other germs. If soap and water are not available, use hand sanitizer.  Do not smoke. Avoid being around people who are smoking (secondhand smoke).  Keep all follow-up visits as told by your health care provider. This is  important. Contact a health care provider if:  You have a fever.  Your symptoms get worse.  Your symptoms do not improve within 10 days. Get help right away if:  You have a severe headache.  You have persistent vomiting.  You have pain or swelling around your face or eyes.  You have vision problems.  You develop confusion.  Your neck is stiff.  You have trouble breathing. This information is not intended to replace advice given to you by your health  care provider. Make sure you discuss any questions you have with your health care provider. Document Released: 09/25/2005 Document Revised: 05/21/2016 Document Reviewed: 07/21/2015 Elsevier Interactive Patient Education  2018 [employer]. Sinus Rinse What is a sinus rinse? A sinus rinse is a simple home treatment that is used to rinse your sinuses with a sterile mixture of salt and water (saline solution). Sinuses are air-filled spaces in your skull behind the bones of your face and forehead that open into your nasal cavity. You will use the following:  Saline solution.  Neti pot or spray bottle. This releases the saline solution into your nose and through your sinuses. Neti pots and spray bottles can be purchased at [employer], a health food store, or online.  When would I do a sinus rinse? A sinus rinse can help to clear mucus, dirt, dust, or pollen from the nasal cavity. You may do a sinus rinse when you have a cold, a virus, nasal allergy symptoms, a sinus infection, or stuffiness in the nose or sinuses. If you are considering a sinus rinse:  Ask your child's health care provider before performing a sinus rinse on your child.  Do not do a sinus rinse if you have had ear or nasal surgery, ear infection, or blocked ears.  How do I do a sinus rinse?  Wash your hands.  Disinfect your device according to the directions provided and then dry it.  Use the solution that comes with your device or one that is sold  separately in stores. Follow the mixing directions on the package.  Fill your device with the amount of saline solution as directed by the device instructions.  Stand over a sink and tilt your head sideways over the sink.  Place the spout of the device in your upper nostril (the one closer to the ceiling).  Gently pour or squeeze the saline solution into the nasal cavity. The liquid should drain to the lower nostril if you are not overly congested.  Gently blow your nose. Blowing too hard may cause ear pain.  Repeat in the other nostril.  Clean and rinse your device with clean water and then air-dry it. Are there risks of a sinus rinse? Sinus rinse is generally very safe and effective. However, there are a few risks, which include:  A burning sensation in the sinuses. This may happen if you do not make the saline solution as directed. Make sure to follow all directions when making the saline solution.  Infection from contaminated water. This is rare, but possible.  Nasal irritation.  This information is not intended to replace advice given to you by your health care provider. Make sure you discuss any questions you have with your health care provider. Document Released: 04/22/2014 Document Revised: 08/22/2016 Document Reviewed: 02/10/2014 Elsevier Interactive Patient Education  2017 Elsevier Inc. Allergic Rhinitis, Adult Allergic rhinitis is an allergic reaction that affects the mucous membrane inside the nose. It causes sneezing, a runny or stuffy nose, and the feeling of mucus going down the back of the throat (postnasal drip). Allergic rhinitis can be mild to severe. There are two types of allergic rhinitis:  Seasonal. This type is also called hay fever. It happens only during certain seasons.  Perennial. This type can happen at any time of the year.  What are the causes? This condition happens when the body's defense system (immune system) responds to certain harmless  substances called allergens as though they were germs.  Seasonal allergic rhinitis  is triggered by pollen, which can come from grasses, trees, and weeds. Perennial allergic rhinitis may be caused by:  House dust mites.  Pet dander.  Mold spores.  What are the signs or symptoms? Symptoms of this condition include:  Sneezing.  Runny or stuffy nose (nasal congestion).  Postnasal drip.  Itchy nose.  Tearing of the eyes.  Trouble sleeping.  Daytime sleepiness.  How is this diagnosed? This condition may be diagnosed based on:  Your medical history.  A physical exam.  Tests to check for related conditions, such as: ? Asthma. ? Pink eye. ? Ear infection. ? Upper respiratory infection.  Tests to find out which allergens trigger your symptoms. These may include skin or blood tests.  How is this treated? There is no cure for this condition, but treatment can help control symptoms. Treatment may include:  Taking medicines that block allergy symptoms, such as antihistamines. Medicine may be given as a shot, nasal spray, or pill.  Avoiding the allergen.  Desensitization. This treatment involves getting ongoing shots until your body becomes less sensitive to the allergen. This treatment may be done if other treatments do not help.  If taking medicine and avoiding the allergen does not work, new, stronger medicines may be prescribed.  Follow these instructions at home:  Find out what you are allergic to. Common allergens include smoke, dust, and pollen.  Avoid the things you are allergic to. These are some things you can do to help avoid allergens: ? Replace carpet with wood, tile, or vinyl flooring. Carpet can trap dander and dust. ? Do not smoke. Do not allow smoking in your home. ? Change your heating and air conditioning filter at least once a month. ? During allergy season:  Keep windows closed as much as possible.  Plan outdoor activities when pollen counts are  lowest. This is usually during the evening hours.  When coming indoors, change clothing and shower before sitting on furniture or bedding.  Take over-the-counter and prescription medicines only as told by your health care provider.  Keep all follow-up visits as told by your health care provider. This is important. Contact a health care provider if:  You have a fever.  You develop a persistent cough.  You make whistling sounds when you breathe (you wheeze).  Your symptoms interfere with your normal daily activities. Get help right away if:  You have shortness of breath. Summary  This condition can be managed by taking medicines as directed and avoiding allergens.  Contact your health care provider if you develop a persistent cough or fever.  During allergy season, keep windows closed as much as possible. This information is not intended to replace advice given to you by your health care provider. Make sure you discuss any questions you have with your health care provider. Document Released: 06/20/2001 Document Revised: 11/02/2016 Document Reviewed: 11/02/2016 Elsevier Interactive Patient Education  [employer].

## 2018-01-03 ENCOUNTER — Ambulatory Visit: Payer: Self-pay | Admitting: *Deleted

## 2018-01-03 DIAGNOSIS — Z23 Encounter for immunization: Secondary | ICD-10-CM

## 2018-01-03 NOTE — Progress Notes (Signed)
Tdap vaccination s/p laceration 12/27/17. Last tetanus about 6 years ago.

## 2018-01-28 ENCOUNTER — Telehealth: Payer: Self-pay | Admitting: *Deleted

## 2018-01-28 MED ORDER — FLUTICASONE PROPIONATE 50 MCG/ACT NA SUSP: 1.0000 | Freq: Two times a day (BID) | NASAL | 6 refills | Status: DC

## 2018-01-28 NOTE — Telephone Encounter (Signed)
Refill request via fax from [facility] for Fluticasone nasal spray. Originally Rx'd 12/27/17, RF0. However in clinic OV notes, was written to be Rx'd with RF6. Med reordered with 6 refills in epic and note with same info signed on fax and returned to pharmacy.

## 2018-01-29 NOTE — Telephone Encounter (Signed)
Noted and signed refill

## 2018-02-27 ENCOUNTER — Telehealth: Payer: Self-pay | Admitting: Primary Care

## 2018-02-27 NOTE — Telephone Encounter (Signed)
I received notification that her losartan-HCTZ may be on the recall list.  Can we call [facility] to verify?

## 2018-02-28 NOTE — Telephone Encounter (Signed)
Noted  

## 2018-02-28 NOTE — Telephone Encounter (Signed)
Spoken to pharmacy and medication that they have are not on the recall list

## 2018-04-11 ENCOUNTER — Other Ambulatory Visit: Payer: Self-pay | Admitting: Primary Care

## 2018-04-11 DIAGNOSIS — I10 Essential (primary) hypertension: Secondary | ICD-10-CM

## 2018-04-22 ENCOUNTER — Other Ambulatory Visit: Payer: Self-pay | Admitting: Primary Care

## 2018-04-22 DIAGNOSIS — I10 Essential (primary) hypertension: Secondary | ICD-10-CM

## 2018-07-12 ENCOUNTER — Ambulatory Visit: Payer: Self-pay

## 2018-07-18 ENCOUNTER — Other Ambulatory Visit: Payer: Self-pay | Admitting: Primary Care

## 2018-07-18 DIAGNOSIS — I10 Essential (primary) hypertension: Secondary | ICD-10-CM

## 2018-07-26 MED ORDER — HYDROCHLOROTHIAZIDE 25 MG PO TABS: 25.0000 mg | ORAL_TABLET | Freq: Every day | ORAL | 0 refills | Status: DC

## 2018-07-26 MED ORDER — LOSARTAN POTASSIUM 100 MG PO TABS: 100.0000 mg | ORAL_TABLET | Freq: Every day | ORAL | 0 refills | Status: DC

## 2018-07-26 NOTE — Telephone Encounter (Signed)
Received faxed that Rx is on back order. Will change 2 separate Rx

## 2018-08-20 ENCOUNTER — Other Ambulatory Visit: Payer: Self-pay | Admitting: Primary Care

## 2018-09-09 ENCOUNTER — Other Ambulatory Visit: Payer: Self-pay | Admitting: Primary Care

## 2018-09-09 DIAGNOSIS — I10 Essential (primary) hypertension: Secondary | ICD-10-CM

## 2018-09-09 DIAGNOSIS — Z0000 Encounter for general adult medical examination without abnormal findings: Secondary | ICD-10-CM

## 2018-09-09 DIAGNOSIS — Z1159 Encounter for screening for other viral diseases: Secondary | ICD-10-CM

## 2018-09-10 ENCOUNTER — Other Ambulatory Visit (INDEPENDENT_AMBULATORY_CARE_PROVIDER_SITE_OTHER): Payer: PRIVATE HEALTH INSURANCE

## 2018-09-10 DIAGNOSIS — Z1159 Encounter for screening for other viral diseases: Secondary | ICD-10-CM

## 2018-09-10 DIAGNOSIS — Z0000 Encounter for general adult medical examination without abnormal findings: Secondary | ICD-10-CM

## 2018-09-10 DIAGNOSIS — I10 Essential (primary) hypertension: Secondary | ICD-10-CM

## 2018-09-10 LAB — COMPREHENSIVE METABOLIC PANEL
ALK PHOS: 108 U/L (ref 39–117)
ALT: 32 U/L (ref 0–35)
AST: 20 U/L (ref 0–37)
Albumin: 4.3 g/dL (ref 3.5–5.2)
BUN: 16 mg/dL (ref 6–23)
CO2: 28 mEq/L (ref 19–32)
Calcium: 9.7 mg/dL (ref 8.4–10.5)
Chloride: 102 mEq/L (ref 96–112)
Creatinine, Ser: 0.72 mg/dL (ref 0.40–1.20)
GFR: 89.69 mL/min (ref >60.00)
Glucose, Bld: 143 mg/dL — ABNORMAL HIGH (ref 70–99)
POTASSIUM: 4 meq/L (ref 3.5–5.1)
Sodium: 139 mEq/L (ref 135–145)
Total Bilirubin: 0.6 mg/dL (ref 0.2–1.2)
Total Protein: 7.2 g/dL (ref 6.0–8.3)

## 2018-09-10 LAB — LIPID PANEL
Cholesterol: 199 mg/dL (ref 0–200)
HDL: 46.4 mg/dL (ref >39.00)
LDL Cholesterol: 134 mg/dL — ABNORMAL HIGH (ref 0–99)
NonHDL: 152.3
Total CHOL/HDL Ratio: 4
Triglycerides: 93 mg/dL (ref 0.0–149.0)
VLDL: 18.6 mg/dL (ref 0.0–40.0)

## 2018-09-11 LAB — HEPATITIS C ANTIBODY
Hepatitis C Ab: NONREACTIVE
SIGNAL TO CUT-OFF: 0.02 (ref <1.00)

## 2018-09-13 ENCOUNTER — Other Ambulatory Visit: Payer: Self-pay | Admitting: Primary Care

## 2018-09-23 ENCOUNTER — Ambulatory Visit (INDEPENDENT_AMBULATORY_CARE_PROVIDER_SITE_OTHER): Payer: PRIVATE HEALTH INSURANCE | Admitting: Primary Care

## 2018-09-23 ENCOUNTER — Encounter: Payer: Self-pay | Admitting: Primary Care

## 2018-09-23 VITALS — BP 132/86 | HR 66 | Temp 97.7°F | Ht 66.5 in | Wt 245.8 lb

## 2018-09-23 DIAGNOSIS — Z1211 Encounter for screening for malignant neoplasm of colon: Secondary | ICD-10-CM

## 2018-09-23 DIAGNOSIS — Z0000 Encounter for general adult medical examination without abnormal findings: Secondary | ICD-10-CM

## 2018-09-23 DIAGNOSIS — K219 Gastro-esophageal reflux disease without esophagitis: Secondary | ICD-10-CM | POA: Diagnosis not present

## 2018-09-23 DIAGNOSIS — Z1239 Encounter for other screening for malignant neoplasm of breast: Secondary | ICD-10-CM

## 2018-09-23 DIAGNOSIS — G4733 Obstructive sleep apnea (adult) (pediatric): Secondary | ICD-10-CM | POA: Diagnosis not present

## 2018-09-23 DIAGNOSIS — I10 Essential (primary) hypertension: Secondary | ICD-10-CM

## 2018-09-23 DIAGNOSIS — R739 Hyperglycemia, unspecified: Secondary | ICD-10-CM | POA: Diagnosis not present

## 2018-09-23 DIAGNOSIS — R7303 Prediabetes: Secondary | ICD-10-CM

## 2018-09-23 LAB — POCT GLYCOSYLATED HEMOGLOBIN (HGB A1C): Hemoglobin A1C: 5.9 % — AB (ref 4.0–5.6)

## 2018-09-23 MED ORDER — OMEPRAZOLE 20 MG PO CPDR: 20.0000 mg | DELAYED_RELEASE_CAPSULE | Freq: Every day | ORAL | 3 refills | Status: DC

## 2018-09-23 MED ORDER — LOSARTAN POTASSIUM 100 MG PO TABS: 100.0000 mg | ORAL_TABLET | Freq: Every day | ORAL | 3 refills | Status: DC

## 2018-09-23 MED ORDER — HYDROCHLOROTHIAZIDE 25 MG PO TABS: 25.0000 mg | ORAL_TABLET | Freq: Every day | ORAL | 3 refills | Status: DC

## 2018-09-23 NOTE — Patient Instructions (Addendum)
Continue to work on a healthy diet. Increase vegetables, fruit, whole grains, lean protein.   Start exercising. You should be getting 150 minutes of moderate intensity exercise weekly.  Ensure you are consuming 64 ounces of water daily.  Call the [facility] to schedule your mammogram.  You will be contacted regarding your referral to GI for the colonoscopy.  Please let us know if you have not been contacted within one week.   Schedule a lab only appointment in 6 months to repeat your cholesterol and blood sugar.  It was a pleasure to see you today!   Preventive Care 40-64 Years, Female Preventive care refers to lifestyle choices and visits with your health care provider that can promote health and wellness. What does preventive care include?  A yearly physical exam. This is also called an annual well check.  Dental exams once or twice a year.  Routine eye exams. Ask your health care provider how often you should have your eyes checked.  Personal lifestyle choices, including: ? Daily care of your teeth and gums. ? Regular physical activity. ? Eating a healthy diet. ? Avoiding tobacco and drug use. ? Limiting alcohol use. ? Practicing safe sex. ? Taking low-dose aspirin daily starting at age 55. ? Taking vitamin and mineral supplements as recommended by your health care provider. What happens during an annual well check? The services and screenings done by your health care provider during your annual well check will depend on your age, overall health, lifestyle risk factors, and family history of disease. Counseling Your health care provider may ask you questions about your:  Alcohol use.  Tobacco use.  Drug use.  Emotional well-being.  Home and relationship well-being.  Sexual activity.  Eating habits.  Work and work Statistician.  Method of birth control.  Menstrual cycle.  Pregnancy history.  Screening You may have the following tests or  measurements:  Height, weight, and BMI.  Blood pressure.  Lipid and cholesterol levels. These may be checked every 5 years, or more frequently if you are over 63 years old.  Skin check.  Lung cancer screening. You may have this screening every year starting at age 47 if you have a 30-pack-year history of smoking and currently smoke or have quit within the past 15 years.  Fecal occult blood test (FOBT) of the stool. You may have this test every year starting at age 14.  Flexible sigmoidoscopy or colonoscopy. You may have a sigmoidoscopy every 5 years or a colonoscopy every 10 years starting at age 27.  Hepatitis C blood test.  Hepatitis B blood test.  Sexually transmitted disease (STD) testing.  Diabetes screening. This is done by checking your blood sugar (glucose) after you have not eaten for a while (fasting). You may have this done every 1-3 years.  Mammogram. This may be done every 1-2 years. Talk to your health care provider about when you should start having regular mammograms. This may depend on whether you have a family history of breast cancer.  BRCA-related cancer screening. This may be done if you have a family history of breast, ovarian, tubal, or peritoneal cancers.  Pelvic exam and Pap test. This may be done every 3 years starting at age 43. Starting at age 30, this may be done every 5 years if you have a Pap test in combination with an HPV test.  Bone density scan. This is done to screen for osteoporosis. You may have this scan if you are at high  risk for osteoporosis.  Discuss your test results, treatment options, and if necessary, the need for more tests with your health care provider. Vaccines Your health care provider may recommend certain vaccines, such as:  Influenza vaccine. This is recommended every year.  Tetanus, diphtheria, and acellular pertussis (Tdap, Td) vaccine. You may need a Td booster every 10 years.  Varicella vaccine. You may need this if  you have not been vaccinated.  Zoster vaccine. You may need this after age 63.  Measles, mumps, and rubella (MMR) vaccine. You may need at least one dose of MMR if you were born in 1957 or later. You may also need a second dose.  Pneumococcal 13-valent conjugate (PCV13) vaccine. You may need this if you have certain conditions and were not previously vaccinated.  Pneumococcal polysaccharide (PPSV23) vaccine. You may need one or two doses if you smoke cigarettes or if you have certain conditions.  Meningococcal vaccine. You may need this if you have certain conditions.  Hepatitis A vaccine. You may need this if you have certain conditions or if you travel or work in places where you may be exposed to hepatitis A.  Hepatitis B vaccine. You may need this if you have certain conditions or if you travel or work in places where you may be exposed to hepatitis B.  Haemophilus influenzae type b (Hib) vaccine. You may need this if you have certain conditions.  Talk to your health care provider about which screenings and vaccines you need and how often you need them. This information is not intended to replace advice given to you by your health care provider. Make sure you discuss any questions you have with your health care provider. Document Released: 10/22/2015 Document Revised: 06/14/2016 Document Reviewed: 07/27/2015 Elsevier Interactive Patient Education  [employer].

## 2018-09-23 NOTE — Progress Notes (Signed)
Subjective:    Patient ID: Diana Lutz, female    DOB: [birth year], 52 y.o.   MRN: [id number]  HPI  Diana Lutz is a 52 year old female who presents today for complete physical.  Immunizations: -Tetanus: Completed in 2019 -Influenza: Completed this season    Diet: She endorses a healthy diet for which she started 2 weeks ago.  Breakfast: Hard boiled eggs Lunch: Salad Dinner: Salad, protein Snacks: None Desserts: No desserts in 2 weeks, before then every other day. Beverages: Water now; before soda  Exercise: She is not exercising, active Eye exam: Completed 2018 Dental exam: Completes 3-4 times annually  Colonoscopy: Completed in 2007 Pap Smear: Completed in 2016, Follows with GYN. Mammogram: Completed in 2018 Hep C Screen: Completed this season   BP Readings from Last 3 Encounters:  09/23/18 132/86  12/27/17 (!) 130/93  07/31/17 (!) 120/94     Review of Systems  Constitutional: Negative for unexpected weight change.  HENT: Negative for rhinorrhea.   Respiratory: Negative for cough and shortness of breath.   Cardiovascular: Negative for chest pain.  Gastrointestinal: Negative for constipation and diarrhea.  Genitourinary: Negative for difficulty urinating and menstrual problem.  Musculoskeletal: Negative for arthralgias and myalgias.  Skin: Negative for rash.  Allergic/Immunologic: Positive for environmental allergies.  Neurological: Negative for dizziness, numbness and headaches.  Psychiatric/Behavioral: The patient is not nervous/anxious.        Past Medical History:  Diagnosis Date  . Abnormal Pap smear of cervix 1991  . Anemia    history of anemia  . Dysplasia of cervix    cervical ca  . GERD (gastroesophageal reflux disease)   . Hypertension   . Kidney stone   . Migraines      Social History   Socioeconomic History  . Marital status: Married    Spouse name: Not on file  . Number of children: Not on file  . Years of education: Not  on file  . Highest education level: Not on file  Occupational History  . Not on file  Social Needs  . Financial resource strain: Not on file  . Food insecurity:    Worry: Not on file    Inability: Not on file  . Transportation needs:    Medical: Not on file    Non-medical: Not on file  Tobacco Use  . Smoking status: Never Smoker  . Smokeless tobacco: Never Used  Substance and Sexual Activity  . Alcohol use: Yes    Alcohol/week: 0.0 standard drinks    Comment: social  . Drug use: No  . Sexual activity: Not Currently  Lifestyle  . Physical activity:    Days per week: Not on file    Minutes per session: Not on file  . Stress: Not on file  Relationships  . Social connections:    Talks on phone: Not on file    Gets together: Not on file    Attends religious service: Not on file    Active member of club or organization: Not on file    Attends meetings of clubs or organizations: Not on file    Relationship status: Not on file  . Intimate partner violence:    Fear of current or ex partner: Not on file    Emotionally abused: Not on file    Physically abused: Not on file    Forced sexual activity: Not on file  Other Topics Concern  . Not on file  Social History Narrative   Married.  3 children   Works for [employer] as Engineer, manufacturing systems.   Enjoys four wheeling, working on the house    Past Surgical History:  Procedure Laterality Date  . ANTERIOR AND POSTERIOR REPAIR N/A 08/18/2015   Procedure: ANTERIOR (CYSTOCELE) AND POSTERIOR REPAIR (RECTOCELE)/Perineorrhaphy;  Surgeon: Azucena Fallen, MD;  Location: [facility];  Service: Gynecology;  Laterality: N/A;  2 hrs.  Marland Kitchen BLADDER SUSPENSION N/A 08/18/2015   Procedure: TRANSVAGINAL TAPE (TVT) Mid-Urethral Sling PROCEDURE;  Surgeon: Azucena Fallen, MD;  Location: [facility];  Service: Gynecology;  Laterality: N/A;  . CYSTOSCOPY N/A 08/18/2015   Procedure: CYSTOSCOPY;  Surgeon: Azucena Fallen, MD;  Location: [facility];  Service: Gynecology;   Laterality: N/A;  . HYSTEROSCOPY W/ ENDOMETRIAL ABLATION    . LASER ABLATION OF THE CERVIX    . WISDOM TOOTH EXTRACTION      Family History  Problem Relation Age of Onset  . Hypertension Father   . Stroke Father   . Heart attack Father   . Prostate cancer Father   . Bladder Cancer Father   . Hypertension Mother   . Diverticulosis Mother   . Hypertension Brother   . Diabetes Brother     No Known Allergies  Current Outpatient Medications on File Prior to Visit  Medication Sig Dispense Refill  . calcium carbonate (TUMS - DOSED IN MG ELEMENTAL CALCIUM) 500 MG chewable tablet Chew 2 tablets by mouth daily as needed for indigestion or heartburn.    . [facility] LUBRICANT EYE DROPS 0.4-0.3 % SOLN APPLY 1 DROP TO EYE 3 (THREE) TIMES DAILY.  12  . fluticasone (FLONASE) 50 MCG/ACT nasal spray Place 1 spray into both nostrils 2 (two) times daily. 16 g 6  . ibuprofen (ADVIL,MOTRIN) 600 MG tablet Take 600 mg by mouth daily as needed.    . sodium chloride (OCEAN) 0.65 % SOLN nasal spray Place 2 sprays into both nostrils every 2 (two) hours while awake.  0   No current facility-administered medications on file prior to visit.     BP 132/86   Pulse 66   Temp 97.7 F (36.5 C) (Oral)   Ht 5' 6.5" (1.689 m)   Wt 245 lb 12 oz (111.5 kg)   SpO2 98%   BMI 39.07 kg/m    Objective:   Physical Exam  Constitutional: She is oriented to person, place, and time. She appears well-nourished.  HENT:  Mouth/Throat: No oropharyngeal exudate.  Eyes: Pupils are equal, round, and reactive to light. EOM are normal.  Neck: Neck supple. No thyromegaly present.  Cardiovascular: Normal rate and regular rhythm.  Respiratory: Effort normal and breath sounds normal.  GI: Soft. Bowel sounds are normal. There is no abdominal tenderness.  Musculoskeletal: Normal range of motion.  Neurological: She is alert and oriented to person, place, and time.  Skin: Skin is warm and dry.  Psychiatric: She has a normal mood and  affect.           Assessment & Plan:

## 2018-09-23 NOTE — Assessment & Plan Note (Signed)
Hyperglycemia noted on fasting labs. POC A1C with

## 2018-09-23 NOTE — Assessment & Plan Note (Signed)
Intermittent use of CPAP machine, discussed importance of nightly compliance.

## 2018-09-23 NOTE — Assessment & Plan Note (Signed)
Doing well on omeprazole 20 mg, continue same. Refill sent to pharmacy.

## 2018-09-23 NOTE — Assessment & Plan Note (Signed)
Borderline in the office today, she is motivated to lose weight. Continue to monitor with lifestyle changes. Continue Losartan 100 mg and HCTZ 25 mg. BMP reviewed.

## 2018-09-23 NOTE — Assessment & Plan Note (Signed)
Immunizations UTD. Mammogram due, ordered and pending. Pap smear due, she will see her GYN. Commended her on her efforts for a healthy lifestyle thus far, encouraged to continue. Exam unremarkable. Labs reviewed. Follow up in 1 year for CPE.

## 2018-12-17 ENCOUNTER — Encounter: Payer: Self-pay | Admitting: Primary Care

## 2019-03-25 ENCOUNTER — Other Ambulatory Visit (INDEPENDENT_AMBULATORY_CARE_PROVIDER_SITE_OTHER): Payer: PRIVATE HEALTH INSURANCE

## 2019-03-25 ENCOUNTER — Other Ambulatory Visit: Payer: PRIVATE HEALTH INSURANCE

## 2019-03-25 DIAGNOSIS — R739 Hyperglycemia, unspecified: Secondary | ICD-10-CM

## 2019-03-25 DIAGNOSIS — R7303 Prediabetes: Secondary | ICD-10-CM

## 2019-03-25 LAB — HEMOGLOBIN A1C: Hgb A1c MFr Bld: 6 % (ref 4.6–6.5)

## 2019-03-25 LAB — LIPID PANEL
Cholesterol: 194 mg/dL (ref 0–200)
HDL: 44.3 mg/dL (ref >39.00)
LDL Cholesterol: 127 mg/dL — ABNORMAL HIGH (ref 0–99)
NonHDL: 149.32
Total CHOL/HDL Ratio: 4
Triglycerides: 112 mg/dL (ref 0.0–149.0)
VLDL: 22.4 mg/dL (ref 0.0–40.0)

## 2019-05-19 ENCOUNTER — Encounter: Payer: Self-pay | Admitting: Physician Assistant

## 2019-05-19 ENCOUNTER — Telehealth: Payer: PRIVATE HEALTH INSURANCE | Admitting: Physician Assistant

## 2019-05-19 DIAGNOSIS — N390 Urinary tract infection, site not specified: Secondary | ICD-10-CM

## 2019-05-19 MED ORDER — NITROFURANTOIN MONOHYD MACRO 100 MG PO CAPS: 100.0000 mg | ORAL_CAPSULE | Freq: Two times a day (BID) | ORAL | 0 refills | Status: DC

## 2019-05-19 NOTE — Progress Notes (Signed)
We are sorry that you are not feeling well.  Here is how we plan to help!  Based on what you shared with me it looks like you most likely have a simple urinary tract infection.  A UTI (Urinary Tract Infection) is a bacterial infection of the bladder.  Most cases of urinary tract infections are simple to treat but a key part of your care is to encourage you to drink plenty of fluids and watch your symptoms carefully.  I have prescribed MacroBid 100 mg twice a day for 5 days.  Your symptoms should gradually improve. Call us if the burning in your urine worsens, you develop worsening fever, back pain or pelvic pain or if your symptoms do not resolve after completing the antibiotic.  Urinary tract infections can be prevented by drinking plenty of water to keep your body hydrated.  Also be sure when you wipe, wipe from front to back and don't hold it in!  If possible, empty your bladder every 4 hours.  Your e-visit answers were reviewed by a board certified advanced clinical practitioner to complete your personal care plan.  Depending on the condition, your plan could have included both over the counter or prescription medications.  If there is a problem please reply  once you have received a response from your provider.  Your safety is important to us.  If you have drug allergies check your prescription carefully.    You can use MyChart to ask questions about today's visit, request a non-urgent call back, or ask for a work or school excuse for 24 hours related to this e-Visit. If it has been greater than 24 hours you will need to follow up with your provider, or enter a new e-Visit to address those concerns.   You will get an e-mail in the next two days asking about your experience.  I hope that your e-visit has been valuable and will speed your recovery. Thank you for using e-visits.   I spent 5-10 minutes on review and completion of this note- Sahar Osman PAC  

## 2019-07-23 LAB — HM MAMMOGRAPHY

## 2019-07-30 ENCOUNTER — Encounter: Payer: Self-pay | Admitting: Primary Care

## 2019-08-21 ENCOUNTER — Ambulatory Visit: Payer: Self-pay | Admitting: Registered Nurse

## 2019-08-21 ENCOUNTER — Other Ambulatory Visit: Payer: Self-pay

## 2019-08-21 ENCOUNTER — Encounter: Payer: Self-pay | Admitting: Registered Nurse

## 2019-08-21 VITALS — BP 131/92 | HR 75 | Temp 98.9°F

## 2019-08-21 DIAGNOSIS — N61 Mastitis without abscess: Secondary | ICD-10-CM

## 2019-08-21 MED ORDER — CEPHALEXIN 500 MG PO CAPS: 500.0000 mg | ORAL_CAPSULE | Freq: Two times a day (BID) | ORAL | 0 refills | Status: AC

## 2019-08-21 NOTE — Progress Notes (Signed)
Subjective:    Patient ID: Diana Lutz, female    DOB: [birth year], 53 y.o.   MRN: [id number]  53y/o Caucasian established female pt that noted a cyst-like area under L breast last night where her bra underwire sits. Possible redness, swelling. TTP, sore. Feels like a small grape size under the skin. Denies any drainage or open wound.  Denied fever/chills/nipple discharge.  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     Review of Systems  Constitutional: Negative for activity change, appetite change, chills, diaphoresis, fatigue and fever.  HENT: Negative for trouble swallowing and voice change.   Eyes: Negative for photophobia and visual disturbance.  Respiratory: Negative for cough, shortness of breath, wheezing and stridor.   Cardiovascular: Negative for palpitations.  Gastrointestinal: Negative for diarrhea, nausea and vomiting.  Endocrine: Negative for cold intolerance and heat intolerance.  Genitourinary: Negative for difficulty urinating.  Musculoskeletal: Negative for neck pain and neck stiffness.  Skin: Positive for color change and rash. Negative for pallor and wound.  Allergic/Immunologic: Negative for food allergies.  Neurological: Negative for tremors, syncope, speech difficulty and weakness.  Hematological: Negative for adenopathy. Does not bruise/bleed easily.  Psychiatric/Behavioral: Negative for agitation, confusion and sleep disturbance.       Objective:   Physical Exam Vitals signs and nursing note reviewed.  Constitutional:      General: She is awake. She is not in acute distress.    Appearance: Normal appearance. She is well-developed and well-groomed. She is obese. She is not ill-appearing, toxic-appearing or diaphoretic.  HENT:     Head: Normocephalic and atraumatic.     Jaw: There is normal jaw occlusion.     Right Ear: External ear normal.     Left Ear: External ear normal.     Nose: Nose normal.      Mouth/Throat:     Mouth: Mucous membranes are moist.  Eyes:     General: Lids are normal. Vision grossly intact. Gaze aligned appropriately. No visual field deficit or scleral icterus.       Right eye: No discharge.        Left eye: No discharge.     Extraocular Movements: Extraocular movements intact.     Conjunctiva/sclera: Conjunctivae normal.     Pupils: Pupils are equal, round, and reactive to light.  Neck:     Musculoskeletal: Normal range of motion and neck supple. Normal range of motion. No edema, erythema, neck rigidity, crepitus, pain with movement, torticollis or muscular tenderness.     Vascular: No carotid bruit.     Trachea: Trachea normal.  Cardiovascular:     Rate and Rhythm: Normal rate and regular rhythm.     Pulses: Normal pulses.          Radial pulses are 2+ on the right side and 2+ on the left side.     Heart sounds: Normal heart sounds.  Pulmonary:     Effort: Pulmonary effort is normal. No respiratory distress.     Breath sounds: Normal breath sounds and air entry. No stridor, decreased air movement or transmitted upper airway sounds. No decreased breath sounds, wheezing, rhonchi or rales.     Comments: No cough observed in exam room; spoke full sentences without difficulty; wearing cloth mask due to covid 19 pandemic Chest:     Chest wall: Swelling and tenderness present. No crepitus.     Breasts: Tanner Score is 5.  Right: Skin change and tenderness present. No bleeding or nipple discharge.     Abdominal:     General: Bowel sounds are normal.     Palpations: Abdomen is soft.  Musculoskeletal: Normal range of motion.        General: Swelling and tenderness present. No deformity or signs of injury.     Right shoulder: Normal.     Left shoulder: Normal.     Right elbow: Normal.    Left elbow: Normal.     Right hip: Normal.     Left hip: Normal.     Right knee: Normal.     Left knee: Normal.     Cervical back: Normal.     Thoracic back:  Normal.     Lumbar back: Normal.     Right hand: Normal.     Left hand: Normal.  Lymphadenopathy:     Head:     Right side of head: No submental, submandibular, tonsillar, preauricular, posterior auricular or occipital adenopathy.     Left side of head: No submental, submandibular, tonsillar, preauricular, posterior auricular or occipital adenopathy.     Cervical: No cervical adenopathy.     Right cervical: No superficial cervical adenopathy.    Left cervical: No superficial cervical adenopathy.     Upper Body:     Right upper body: No supraclavicular, axillary or pectoral adenopathy.  Skin:    General: Skin is warm and dry.     Capillary Refill: Capillary refill takes less than 2 seconds.     Coloration: Skin is not ashen, cyanotic, jaundiced, mottled, pale or sallow.     Findings: Erythema and rash present. No abrasion, abscess, acne, bruising, burn, ecchymosis, laceration, lesion, petechiae or wound. Rash is macular, papular and scaling. Rash is not crusting, nodular, purpuric, pustular, urticarial or vesicular.     Nails: There is no clubbing.           Comments: Indurated area slightly TTP on left breast  Neurological:     General: No focal deficit present.     Mental Status: She is alert and oriented to person, place, and time. Mental status is at baseline.     GCS: GCS eye subscore is 4. GCS verbal subscore is 5. GCS motor subscore is 6.     Cranial Nerves: Cranial nerves are intact. No cranial nerve deficit, dysarthria or facial asymmetry.     Sensory: Sensation is intact. No sensory deficit.     Motor: Motor function is intact. No weakness, tremor, atrophy, abnormal muscle tone or seizure activity.     Coordination: Coordination is intact. Coordination normal.     Gait: Gait is intact. Gait normal.     Comments: On/off exam table and in/out of chair without difficulty; gait sure and steady in hallway  Psychiatric:        Attention and Perception: Attention and perception  normal.        Mood and Affect: Mood and affect normal.        Speech: Speech normal.        Behavior: Behavior normal. Behavior is cooperative.        Thought Content: Thought content normal.        Cognition and Memory: Cognition and memory normal.        Judgment: Judgment normal.           Assessment & Plan:  A-left breast cellulitis initial appt  P-Exitcare handout on skin infection printed and given to patient.  RTC if worsening erythema, pain, purulent discharge, fever after 48 hours of keflex or no resolution with plan of care.  Keflex 500mg  po BID X 5 days #30 RF0 dispensed from [facility] to patient.  Discussed longer course may be needed if erythema/swelling not completely resolved on day 5 return to clinic for re-evaluation especially if fever/chills/discharge/worsening after 48 hours of keflex.  Not uncommon to see swelling/redness worsen first 24 hours of antibiotics but shouldn't spread over entire breast or into armpit.. Wash towels, washcloths, sheets every couple  of days until infection resolved. Wash area with soap and water at least daily. Discussed may want to wear camisole instead of underwire bra until area less swollen.  Avoid scratching/itching.  May apply ice or warm pack to area 15 minutes TID for comfort.   Patient verbalized understanding, agreed with plan of care and had no further questions at this time.

## 2019-08-21 NOTE — Patient Instructions (Signed)

## 2019-11-02 ENCOUNTER — Other Ambulatory Visit: Payer: Self-pay | Admitting: Primary Care

## 2019-11-02 DIAGNOSIS — I10 Essential (primary) hypertension: Secondary | ICD-10-CM

## 2020-01-08 ENCOUNTER — Telehealth: Payer: Self-pay | Admitting: *Deleted

## 2020-01-08 ENCOUNTER — Encounter: Payer: Self-pay | Admitting: *Deleted

## 2020-01-08 DIAGNOSIS — J029 Acute pharyngitis, unspecified: Secondary | ICD-10-CM

## 2020-01-08 DIAGNOSIS — J209 Acute bronchitis, unspecified: Secondary | ICD-10-CM

## 2020-01-08 MED ORDER — BENZONATATE 200 MG PO CAPS: 200.0000 mg | ORAL_CAPSULE | Freq: Three times a day (TID) | ORAL | 0 refills | Status: AC | PRN

## 2020-01-08 MED ORDER — AMOXICILLIN-POT CLAVULANATE 875-125 MG PO TABS: 1.0000 | ORAL_TABLET | Freq: Two times a day (BID) | ORAL | 0 refills | Status: AC

## 2020-01-08 NOTE — Telephone Encounter (Signed)
Reviewed RN note.  Contacted patient via telephone.  Symptoms started yesterday cough, fatigue.  Clear discharge nose today.  Yellow thick not see through when coughing up from chest.  Voice froggy and frequent cough noted on telephone conversation.  Denied fever/chills/loss taste/smell/n/v/d.  Is having fatigue.  Has inhaler at home but not using.  Last augmentin use 12/27/2017.  Throat really sore.  Doesn't see white spots in back of her throat.  Tonsils red and enlarged unsure what normal size usually is.  Discussed salt water gargles may be helpful. She has motrin at home for prn use.  Electronic Rx for cough tessalon pearles 200mg  po TID prn cough #30 RF0 sent to her pharmacy of choice.   Usually no specific medical treatment is needed if a virus is causing the sore throat. The throat most often gets better on its own within 5 to 7 days. Antibiotic medicine does not cure viral pharyngitis.  For acute pharyngitis caused by bacteria, your healthcare provider will prescribe an antibiotic. Consider using phenylephrine or otc antihistamine po per manufacturer's instruction to help dry up post nasal drip.  Discussed mucinex is good if thick mucous to thin it out for expectoration/use during the day not right before bed otherwise it will keep her up coughing. . Do not smoke.  Marland Kitchen Avoid secondhand smoke and other air pollutants.  . Use a cool mist humidifier to add moisture to the air.  . Get plenty of rest.  . You may want to rest your throat by talking less and eating a diet that is mostly liquid or soft for a day or two. Discussed dairy products can increase mucous production and consider fruit based popsicles/smoothies with honey to help soothe the throat. Marland Kitchen Nonprescription throat lozenges and mouthwashes should help relieve the soreness.  . Gargling with warm saltwater and drinking warm liquids may help. (You can make a saltwater solution by adding 1/4 teaspoon of salt to 8 ounces, or 240 mL, of warm water.)   . A nonprescription pain reliever such as aspirin, acetaminophen, or ibuprofen may ease general aches and pains. FOLLOW UP with clinic staff Monday to see if symptoms improving or if will start covid quarantine profile and retesting end of next week.   Albuterol MDI 19mcg 1-2 puffs po q4-6h prn protracted cough/wheeze #1 RF0 side effect increased heart rate. Bronchitis simple, community acquired, may have started as viral (probably respiratory syncytial, parainfluenza, influenza, or adenovirus), but now evidence of acute purulent bronchitis with resultant bronchial edema and mucus formation.  Viruses are the most common cause of bronchial inflammation in otherwise healthy adults with acute bronchitis.  The appearance of sputum is not predictive of whether a bacterial infection is present.  Purulent sputum is most often caused by viral infections.  There are a small portion of those caused by non-viral agents being Mycoplama pneumonia.  Microscopic examination or C&S of sputum in the healthy adult with acute bronchitis is generally not helpful (usually negative or normal respiratory flora) other considerations being cough from upper respiratory tract infections, sinusitis or allergic syndromes (mild asthma or viral pneumonia).  Differential Diagnoses:  reactive airway disease (asthma, allergic aspergillosis (eosinophilia), chronic bronchitis, respiratory infection (sinusitis, common cold, pneumonia), congestive heart failure, reflux esophagitis, bronchogenic tumor, aspiration syndromes and/or exposure to pulmonary irritants/smoke.  Without high fever, severe dyspnea, lack of physical findings or other risk factors, I will hold on a chest radiograph and CBC at this time.  I discussed that approximately 50% of patients with  acute bronchitis have a cough that lasts up to three weeks, and 25% for over a month.  Tylenol 500mg  one to two tablets every four to six hours as needed for fever or myalgias.  No aspirin.  Patient verbalized agreement and understanding of treatment plan.  P2:  hand washing and cover cough  Patient verbalized understanding of instructions and agreed with plan of care.  P2: Hand washing and diet.

## 2020-01-08 NOTE — Telephone Encounter (Signed)
RN notified by HR manager Hyacinth Meeker of pt reporting initial sx of dry throat yesterday morning (01/07/20), then progressing to productive cough and nasal congestion throughout the day. RN spoke with pt over phone. She stayed home from work today per HR instruction. Today she c/o fatigue, chest congestion, and productive cough. She had a HA last night, feels it was r/t frequent cough, no HA today. She denies body aches, chills, fever, loss of taste/smell. Feels like most sx are in her chest vs maxillary or frontal sinuses. She does not feel she has a sinus infection currently. No pressure behind eyes, no dental pain. She has been using her Flonase, nasal saline spray, and Tylenol at home. No antihistamine currently.  Pt had rapid Covid test done at [facility] last night which was negative. Advised pt may be too early to test since it was first day of sx. Will discuss with NP Otila Kluver during clinic today. Pt advised of tentative quarantine dates of Day 1, today 01/08/20 thru Day 10 01/17/20. She is scheduled to receive her 2nd Covid vaccine on 01/14/20. Plan to contact pt this afternoon with updated POC after discussing with NP.

## 2020-01-08 NOTE — Telephone Encounter (Signed)
Noted. Plan to follow up with pt tomorrow to check sx.

## 2020-01-09 NOTE — Telephone Encounter (Signed)
Spoke with pt over phone. She reports feeling somewhat better today. Sts her fatigue has resolved. Her cough is improved, less frequent, still productive. She notices it is more productive when she is up and moving around. Sts sore throat is now just scratchy, non-painful. Voice still sounds froggy, similar to yesterday.  Tylenol this morning. No other OTCs. Took Tessalon pearles last night, but none today. Has not felt she has needed the albuterol inhaler.  Advised pt that full quarantine and retesting likely not indicated and that NP Otila Kluver will likely f/u with her this weekend, if not then RN Hildred Alamin on Monday, for final decision. Pt reminded not to RTW until cleared by clinic staff and HR. She verbalizes understanding and agreement. No further questions/concerns.

## 2020-01-12 NOTE — Telephone Encounter (Signed)
Spoke with pt over phone. Mildly productive cough still present but improved. Only occurring first thing in the morning or with increased activity. Still using tessalon pearles. No Tylenol in past couple of days. Voice back to normal. Sts she feels that she had great improvement once she was about 2 days into the abx. Denies any new or worsening sx. Advised pt that RN will notify HR Tim that she is cleared to RTW tomorrow morning on regular schedule and that quarantine and testing are not indicated per original plan. Pt also to keep her 2nd Covid vaccine appt on 01/14/20 as scheduled.

## 2020-01-15 NOTE — Telephone Encounter (Signed)
Late entry spoke with patient on 01/13/2020 in warehouse she reported feeling much better and to thank [place] for her care last week.  A&Ox3, skin warm dry and pink, voice strong not congested spoke full sentences without difficulty and no cough observed.  Wearing cloth mask due to covid 19 pandemic.

## 2020-03-15 ENCOUNTER — Other Ambulatory Visit: Payer: Self-pay

## 2020-03-15 ENCOUNTER — Encounter: Payer: Self-pay | Admitting: Primary Care

## 2020-03-15 ENCOUNTER — Ambulatory Visit (INDEPENDENT_AMBULATORY_CARE_PROVIDER_SITE_OTHER): Payer: PRIVATE HEALTH INSURANCE | Admitting: Primary Care

## 2020-03-15 VITALS — BP 126/84 | HR 104 | Temp 98.3°F | Ht 66.5 in | Wt 257.0 lb

## 2020-03-15 DIAGNOSIS — K219 Gastro-esophageal reflux disease without esophagitis: Secondary | ICD-10-CM

## 2020-03-15 DIAGNOSIS — Z1211 Encounter for screening for malignant neoplasm of colon: Secondary | ICD-10-CM | POA: Diagnosis not present

## 2020-03-15 DIAGNOSIS — I10 Essential (primary) hypertension: Secondary | ICD-10-CM | POA: Diagnosis not present

## 2020-03-15 DIAGNOSIS — R7303 Prediabetes: Secondary | ICD-10-CM | POA: Diagnosis not present

## 2020-03-15 DIAGNOSIS — G4733 Obstructive sleep apnea (adult) (pediatric): Secondary | ICD-10-CM

## 2020-03-15 MED ORDER — AMLODIPINE BESYLATE 5 MG PO TABS: 5.0000 mg | ORAL_TABLET | Freq: Every day | ORAL | 0 refills | Status: DC

## 2020-03-15 MED ORDER — OMEPRAZOLE 20 MG PO CPDR: 20.0000 mg | DELAYED_RELEASE_CAPSULE | Freq: Every day | ORAL | 1 refills | Status: DC

## 2020-03-15 MED ORDER — LOSARTAN POTASSIUM 100 MG PO TABS: 100.0000 mg | ORAL_TABLET | Freq: Every day | ORAL | 3 refills | Status: DC

## 2020-03-15 NOTE — Patient Instructions (Signed)
Start amlodipine 5 mg once daily for blood pressure.  Continue taking losartan 100 mg for blood pressure, only take one pill daily.  Stop by the lab prior to leaving today. I will notify you of your results once received.   Start monitoring your blood pressure daily, around the same time of day, for the next 2 weeks.  Ensure that you have rested for 30 minutes prior to checking your blood pressure. Record your readings and send me several readings via My Chart in a few weeks.  You will be contacted regarding your referral to GI for the colonoscopy.  Please let us know if you have not been contacted within two weeks.   Resume your CPAP machine.  It was a pleasure to see you today!

## 2020-03-15 NOTE — Assessment & Plan Note (Signed)
Repeat A1C pending.  Discussed the importance of a healthy diet and regular exercise in order for weight loss, and to reduce the risk of any potential medical problems.  

## 2020-03-15 NOTE — Assessment & Plan Note (Signed)
Non compliant, discussed the need to resume.

## 2020-03-15 NOTE — Progress Notes (Signed)
Subjective:    Patient ID: Diana Lutz, female    DOB: 09-Oct-1966, 54 y.o.   MRN: [id number]  HPI  This visit occurred during the SARS-CoV-2 public health emergency.  Safety protocols were in place, including screening questions prior to the visit, additional usage of staff PPE, and extensive cleaning of exam room while observing appropriate contact time as indicated for disinfecting solutions.   Diana Lutz is a 54 year old female with a history of hypertension, OSA, prediabetes, vitamin D deficiency who presents today for medication refills and follow up. She is also needing a repeat colonoscopy.   1) Essential hypertension: Currently prescribed HCTZ 25 mg and losartan 100 mg. She accidentally threw away her HCTZ a few weeks ago. She felt that her blood pressure was "increaseing" as she felt "funky" so she's been taking two of her losartan 100 mg for the last several days.   When taking HCTZ 25 mg and losartan 100 mg her home BP level was always 130's/90's. Over the last few days she's feeling much better. Denies headaches, chest pain, shortness of breath.   BP Readings from Last 3 Encounters:  03/15/20 126/84  08/21/19 (!) 131/92  09/23/18 132/86   2) GERD: Chronic symptoms of esophageal and throat burning. She is taking omeprazole 20 mg OTC and has noticed significant improvement.   3) OSA: Completed sleep study in 2018, no regular use of CPAP over the last one year.   Review of Systems  Eyes: Negative for visual disturbance.  Respiratory: Negative for shortness of breath.   Cardiovascular: Negative for chest pain.  Neurological: Negative for dizziness and headaches.       Past Medical History:  Diagnosis Date  . Abnormal Pap smear of cervix 1991  . Anemia    history of anemia  . Dysplasia of cervix    cervical ca  . GERD (gastroesophageal reflux disease)   . Hypertension   . Kidney stone   . Migraines      Social History   Socioeconomic History  .  Marital status: Married    Spouse name: Not on file  . Number of children: Not on file  . Years of education: Not on file  . Highest education level: Not on file  Occupational History  . Not on file  Tobacco Use  . Smoking status: Never Smoker  . Smokeless tobacco: Never Used  Substance and Sexual Activity  . Alcohol use: Yes    Alcohol/week: 0.0 standard drinks    Comment: social  . Drug use: No  . Sexual activity: Not Currently  Other Topics Concern  . Not on file  Social History Narrative   Married.   3 children   Works for [employer] as Engineer, manufacturing systems.   Enjoys four wheeling, working on the house   Social Determinants of Health   Financial Resource Strain:   . Difficulty of Paying Living Expenses:   Food Insecurity:   . Worried About Charity fundraiser in the Last Year:   . Arboriculturist in the Last Year:   Transportation Needs:   . Film/video editor (Medical):   Marland Kitchen Lack of Transportation (Non-Medical):   Physical Activity:   . Days of Exercise per Week:   . Minutes of Exercise per Session:   Stress:   . Feeling of Stress :   Social Connections:   . Frequency of Communication with Friends and Family:   . Frequency of Social Gatherings with Friends and  Family:   . Attends Religious Services:   . Active Member of Clubs or Organizations:   . Attends Archivist Meetings:   Marland Kitchen Marital Status:   Intimate Partner Violence:   . Fear of Current or Ex-Partner:   . Emotionally Abused:   Marland Kitchen Physically Abused:   . Sexually Abused:     Past Surgical History:  Procedure Laterality Date  . ANTERIOR AND POSTERIOR REPAIR N/A 08/18/2015   Procedure: ANTERIOR (CYSTOCELE) AND POSTERIOR REPAIR (RECTOCELE)/Perineorrhaphy;  Surgeon: Azucena Fallen, MD;  Location: [facility];  Service: Gynecology;  Laterality: N/A;  2 hrs.  Marland Kitchen BLADDER SUSPENSION N/A 08/18/2015   Procedure: TRANSVAGINAL TAPE (TVT) Mid-Urethral Sling PROCEDURE;  Surgeon: Azucena Fallen, MD;  Location: [facility];  Service: Gynecology;  Laterality: N/A;  . CYSTOSCOPY N/A 08/18/2015   Procedure: CYSTOSCOPY;  Surgeon: Azucena Fallen, MD;  Location: [facility];  Service: Gynecology;  Laterality: N/A;  . HYSTEROSCOPY W/ ENDOMETRIAL ABLATION    . LASER ABLATION OF THE CERVIX    . WISDOM TOOTH EXTRACTION      Family History  Problem Relation Age of Onset  . Hypertension Father   . Stroke Father   . Heart attack Father   . Prostate cancer Father   . Bladder Cancer Father   . Hypertension Mother   . Diverticulosis Mother   . Hypertension Brother   . Diabetes Brother     No Known Allergies  Current Outpatient Medications on File Prior to Visit  Medication Sig Dispense Refill  . calcium carbonate (TUMS - DOSED IN MG ELEMENTAL CALCIUM) 500 MG chewable tablet Chew 2 tablets by mouth daily as needed for indigestion or heartburn.    . [facility] LUBRICANT EYE DROPS 0.4-0.3 % SOLN APPLY 1 DROP TO EYE 3 (THREE) TIMES DAILY.  12  . fluticasone (FLONASE) 50 MCG/ACT nasal spray Place 1 spray into both nostrils 2 (two) times daily. 16 g 6  . ibuprofen (ADVIL,MOTRIN) 600 MG tablet Take 600 mg by mouth daily as needed.    Marland Kitchen losartan (COZAAR) 100 MG tablet Take 1 tablet (100 mg total) by mouth daily. For blood pressure. 90 tablet 3  . sodium chloride (OCEAN) 0.65 % SOLN nasal spray Place 2 sprays into both nostrils every 2 (two) hours while awake.  0   No current facility-administered medications on file prior to visit.    BP 126/84 (BP Location: Right Arm, Patient Position: Sitting, Cuff Size: Normal)   Pulse (!) 104   Temp 98.3 F (36.8 C) (Temporal)   Ht 5' 6.5" (1.689 m)   Wt 257 lb (116.6 kg)   SpO2 96%   BMI 40.86 kg/m    Objective:   Physical Exam  Constitutional: She appears well-nourished.  Cardiovascular: Normal rate and regular rhythm.  Respiratory: Effort normal and breath sounds normal.  Musculoskeletal:     Cervical back: Neck supple.  Skin: Skin is warm and dry.  Psychiatric: She has a  normal mood and affect.           Assessment & Plan:

## 2020-03-15 NOTE — Progress Notes (Signed)
Patient states she accidentally threw away her hydrochlorothiazide. To make up for this she has been taking two (2) of the losartan 100 mg.   Bp was first 124/78 with large cuff on the left arm. Patient states this was low for her so BP was repeated on the right arm with a regular adult cuff as she had some room in the large cuff. Bp was then 126/84

## 2020-03-15 NOTE — Assessment & Plan Note (Signed)
Well controlled in the office today, unfortunately we cannot continue losartan 200 mg. Discussed to stop this immediately.  Given that she didn't seem to be well controlled with losartan and HCTZ, will discontinue HCTZ and switch to amlodipine. Continue losartan.  She will monitor BP at home and send readings in two weeks.

## 2020-03-15 NOTE — Assessment & Plan Note (Signed)
Well controlled on omeprazole, discussed triggers. Rx printed and provided today, she gets this dispensed at work.

## 2020-03-16 ENCOUNTER — Encounter: Payer: Self-pay | Admitting: Internal Medicine

## 2020-03-16 ENCOUNTER — Telehealth: Payer: Self-pay | Admitting: Primary Care

## 2020-03-16 LAB — COMPREHENSIVE METABOLIC PANEL
ALT: 48 U/L — ABNORMAL HIGH (ref 0–35)
AST: 34 U/L (ref 0–37)
Albumin: 4.3 g/dL (ref 3.5–5.2)
Alkaline Phosphatase: 143 U/L — ABNORMAL HIGH (ref 39–117)
BUN: 20 mg/dL (ref 6–23)
CO2: 25 mEq/L (ref 19–32)
Calcium: 10 mg/dL (ref 8.4–10.5)
Chloride: 104 mEq/L (ref 96–112)
Creatinine, Ser: 0.86 mg/dL (ref 0.40–1.20)
GFR: 68.35 mL/min (ref >60.00)
Glucose, Bld: 303 mg/dL — ABNORMAL HIGH (ref 70–99)
Potassium: 4.3 mEq/L (ref 3.5–5.1)
Sodium: 137 mEq/L (ref 135–145)
Total Bilirubin: 0.6 mg/dL (ref 0.2–1.2)
Total Protein: 7 g/dL (ref 6.0–8.3)

## 2020-03-16 LAB — LDL CHOLESTEROL, DIRECT: Direct LDL: 143 mg/dL

## 2020-03-16 LAB — LIPID PANEL
Cholesterol: 206 mg/dL — ABNORMAL HIGH (ref 0–200)
HDL: 39.7 mg/dL (ref >39.00)
NonHDL: 166.77
Total CHOL/HDL Ratio: 5
Triglycerides: 219 mg/dL — ABNORMAL HIGH (ref 0.0–149.0)
VLDL: 43.8 mg/dL — ABNORMAL HIGH (ref 0.0–40.0)

## 2020-03-16 LAB — HEMOGLOBIN A1C: Hgb A1c MFr Bld: 8 % — ABNORMAL HIGH (ref 4.6–6.5)

## 2020-03-16 NOTE — Telephone Encounter (Signed)
Patient is returning your call about her lab results. 

## 2020-03-17 ENCOUNTER — Other Ambulatory Visit: Payer: Self-pay | Admitting: Primary Care

## 2020-03-17 DIAGNOSIS — E785 Hyperlipidemia, unspecified: Secondary | ICD-10-CM

## 2020-03-17 DIAGNOSIS — E119 Type 2 diabetes mellitus without complications: Secondary | ICD-10-CM

## 2020-03-17 MED ORDER — METFORMIN HCL ER 500 MG PO TB24: 500.0000 mg | ORAL_TABLET | Freq: Every day | ORAL | 1 refills | Status: DC

## 2020-03-17 MED ORDER — ROSUVASTATIN CALCIUM 5 MG PO TABS: 5.0000 mg | ORAL_TABLET | Freq: Every evening | ORAL | 3 refills | Status: DC

## 2020-03-17 NOTE — Telephone Encounter (Signed)
Addressed in result note.  

## 2020-03-17 NOTE — Telephone Encounter (Signed)
Pt is returning your call would like a call back.

## 2020-04-01 DIAGNOSIS — I10 Essential (primary) hypertension: Secondary | ICD-10-CM

## 2020-04-02 MED ORDER — AMLODIPINE BESYLATE 10 MG PO TABS: 10.0000 mg | ORAL_TABLET | Freq: Every day | ORAL | 0 refills | Status: DC

## 2020-04-06 ENCOUNTER — Other Ambulatory Visit: Payer: Self-pay | Admitting: Primary Care

## 2020-04-06 DIAGNOSIS — I10 Essential (primary) hypertension: Secondary | ICD-10-CM

## 2020-04-24 ENCOUNTER — Other Ambulatory Visit: Payer: Self-pay | Admitting: Primary Care

## 2020-04-24 DIAGNOSIS — I10 Essential (primary) hypertension: Secondary | ICD-10-CM

## 2020-04-26 NOTE — Telephone Encounter (Signed)
Last prescribed on 04/02/2020 Last OV (follow up) with [name] on 03/15/2020 Future OV scheduled on 06/25/2020

## 2020-04-26 NOTE — Telephone Encounter (Signed)
How are her blood pressure readings on the increased dose of Amlodipine 10 mg?

## 2020-04-29 NOTE — Telephone Encounter (Signed)
Per DPR, left detail message of Kate Clark's comments for patient to call back 

## 2020-05-03 NOTE — Telephone Encounter (Signed)
Message left for patient to return my call.  

## 2020-05-07 ENCOUNTER — Other Ambulatory Visit: Payer: Self-pay

## 2020-05-07 ENCOUNTER — Ambulatory Visit (AMBULATORY_SURGERY_CENTER): Payer: Self-pay | Admitting: *Deleted

## 2020-05-07 VITALS — Ht 66.5 in | Wt 248.0 lb

## 2020-05-07 DIAGNOSIS — Z1211 Encounter for screening for malignant neoplasm of colon: Secondary | ICD-10-CM

## 2020-05-07 NOTE — Telephone Encounter (Signed)
Please advise 

## 2020-05-07 NOTE — Telephone Encounter (Signed)
Refill(s) sent to pharmacy. I also sent a message to the pharmacy for her to return our calls.

## 2020-05-07 NOTE — Progress Notes (Signed)
Patient is here in-person for PV. Patient denies any allergies to eggs or soy. Patient denies any problems with anesthesia/sedation. Patient denies any oxygen use at home. Patient denies taking any diet/weight loss medications or blood thinners. Patient is not being treated for MRSA or C-diff. Patient is aware of our care-partner policy and [id number] safety protocol. emmi sent to pt via mychart-pt is aware. COVID-19 vaccines completed on 01/14/20.

## 2020-05-20 ENCOUNTER — Encounter: Payer: Self-pay | Admitting: Certified Registered Nurse Anesthetist

## 2020-05-21 ENCOUNTER — Encounter: Payer: Self-pay | Admitting: Internal Medicine

## 2020-05-21 ENCOUNTER — Ambulatory Visit (AMBULATORY_SURGERY_CENTER): Payer: PRIVATE HEALTH INSURANCE | Admitting: Internal Medicine

## 2020-05-21 ENCOUNTER — Other Ambulatory Visit: Payer: Self-pay

## 2020-05-21 VITALS — BP 91/49 | HR 66 | Temp 98.4°F | Resp 15 | Ht 66.5 in | Wt 248.0 lb

## 2020-05-21 DIAGNOSIS — Z1211 Encounter for screening for malignant neoplasm of colon: Secondary | ICD-10-CM | POA: Diagnosis present

## 2020-05-21 DIAGNOSIS — D123 Benign neoplasm of transverse colon: Secondary | ICD-10-CM

## 2020-05-21 DIAGNOSIS — D124 Benign neoplasm of descending colon: Secondary | ICD-10-CM

## 2020-05-21 MED ORDER — SODIUM CHLORIDE 0.9 % IV SOLN: 500.0000 mL | Freq: Once | INTRAVENOUS | Status: DC

## 2020-05-21 NOTE — Progress Notes (Signed)
Patients history reviewed. Patient vaccinated. VS-C.W

## 2020-05-21 NOTE — Progress Notes (Signed)
Report given to PACU, vss 

## 2020-05-21 NOTE — Progress Notes (Signed)
Called to room to assist during endoscopic procedure.  Patient ID and intended procedure confirmed with present staff. Received instructions for my participation in the procedure from the performing physician.  

## 2020-05-21 NOTE — Patient Instructions (Addendum)
I found and removed 2 tiny polyps. I am confident they are benign.   You also have a condition called diverticulosis - common and not usually a problem. Please read the handout provided.  I will let you know pathology results and when to have another routine colonoscopy by mail and/or My Chart.  I appreciate the opportunity to care for you. [name], MD, [facility]   Handouts on polyps and diverticulosis given to you  Await pathology results on polyps removed    YOU HAD AN ENDOSCOPIC PROCEDURE TODAY AT [facility]:   Refer to the procedure report that was given to you for any specific questions about what was found during the examination.  If the procedure report does not answer your questions, please call your gastroenterologist to clarify.  If you requested that your care partner not be given the details of your procedure findings, then the procedure report has been included in a sealed envelope for you to review at your convenience later.  YOU SHOULD EXPECT: Some feelings of bloating in the abdomen. Passage of more gas than usual.  Walking can help get rid of the air that was put into your GI tract during the procedure and reduce the bloating. If you had a lower endoscopy (such as a colonoscopy or flexible sigmoidoscopy) you may notice spotting of blood in your stool or on the toilet paper. If you underwent a bowel prep for your procedure, you may not have a normal bowel movement for a few days.  Please Note:  You might notice some irritation and congestion in your nose or some drainage.  This is from the oxygen used during your procedure.  There is no need for concern and it should clear up in a day or so.  SYMPTOMS TO REPORT IMMEDIATELY:   Following lower endoscopy (colonoscopy or flexible sigmoidoscopy):  Excessive amounts of blood in the stool  Significant tenderness or worsening of abdominal pains  Swelling of the abdomen that is new, acute  Fever of 100F or  higher    For urgent or emergent issues, a gastroenterologist can be reached at any hour by calling [phone]. Do not use MyChart messaging for urgent concerns.    DIET:  We do recommend a small meal at first, but then you may proceed to your regular diet.  Drink plenty of fluids but you should avoid alcoholic beverages for 24 hours.  ACTIVITY:  You should plan to take it easy for the rest of today and you should NOT DRIVE or use heavy machinery until tomorrow (because of the sedation medicines used during the test).    FOLLOW UP: Our staff will call the number listed on your records 48-72 hours following your procedure to check on you and address any questions or concerns that you may have regarding the information given to you following your procedure. If we do not reach you, we will leave a message.  We will attempt to reach you two times.  During this call, we will ask if you have developed any symptoms of COVID 19. If you develop any symptoms (ie: fever, flu-like symptoms, shortness of breath, cough etc.) before then, please call [phone].  If you test positive for Covid 19 in the 2 weeks post procedure, please call and report this information to [facility].    If any biopsies were taken you will be contacted by phone or by letter within the next 1-3 weeks.  Please call [facility] at [phone]  if you have not heard about the biopsies in 3 weeks.    SIGNATURES/CONFIDENTIALITY: You and/or your care partner have signed paperwork which will be entered into your electronic medical record.  These signatures attest to the fact that that the information above on your After Visit Summary has been reviewed and is understood.  Full responsibility of the confidentiality of this discharge information lies with you and/or your care-partner.

## 2020-05-21 NOTE — Op Note (Signed)
[facility] Patient Name: [facility] Procedure Date: 05/21/2020 11:16 AM MRN: [id number] Endoscopist: [name] , MD Age: 54 Referring MD:  Date of Birth: 09-26-1966 Gender: Female Account #: [id number] Procedure:                Colonoscopy Indications:              Screening for colorectal malignant neoplasm Medicines:                Propofol per Anesthesia, Monitored Anesthesia Care Procedure:                Pre-Anesthesia Assessment:                           - Prior to the procedure, a History and Physical                            was performed, and patient medications and                            allergies were reviewed. The patient's tolerance of                            previous anesthesia was also reviewed. The risks                            and benefits of the procedure and the sedation                            options and risks were discussed with the patient.                            All questions were answered, and informed consent                            was obtained. ASA Grade Assessment: II - A patient                            with mild systemic disease. After reviewing the                            risks and benefits, the patient was deemed in                            satisfactory condition to undergo the procedure.                           After obtaining informed consent, the colonoscope                            was passed under direct vision. Throughout the                            procedure, the patient's blood pressure, pulse, and  oxygen saturations were monitored continuously. The                            Colonoscope was introduced through the anus and                            advanced to the the cecum, identified by                            appendiceal orifice and ileocecal valve. The                            colonoscopy was performed without difficulty. The                             patient tolerated the procedure well. The quality                            of the bowel preparation was excellent. The                            ileocecal valve, appendiceal orifice, and rectum                            were photographed. The bowel preparation used was                            Miralax via split dose instruction. Scope In: 11:24:03 AM Scope Out: 11:34:35 AM Scope Withdrawal Time: 0 hours 8 minutes 20 seconds  Total Procedure Duration: 0 hours 10 minutes 32 seconds  Findings:                 The perianal and digital rectal examinations were                            normal.                           Two sessile polyps were found in the descending                            colon and transverse colon. The polyps were                            diminutive in size. These polyps were removed with                            a cold snare. Resection and retrieval were                            complete. Verification of patient identification                            for the specimen was done. Estimated blood loss was  minimal.                           Many small and large-mouthed diverticula were found                            in the sigmoid colon and descending colon.                           The exam was otherwise without abnormality on                            direct and retroflexion views. Complications:            No immediate complications. Estimated Blood Loss:     Estimated blood loss was minimal. Impression:               - Two diminutive polyps in the descending colon and                            in the transverse colon, removed with a cold snare.                            Resected and retrieved.                           - Diverticulosis in the sigmoid colon and in the                            descending colon.                           - The examination was otherwise normal on direct                            and  retroflexion views. Recommendation:           - Patient has a contact number available for                            emergencies. The signs and symptoms of potential                            delayed complications were discussed with the                            patient. Return to normal activities tomorrow.                            Written discharge instructions were provided to the                            patient.                           - Resume previous diet.                           -  Continue present medications.                           - Await pathology results.                           - Repeat colonoscopy is recommended. The                            colonoscopy date will be determined after pathology                            results from today's exam become available for                            review. [name], MD 05/21/2020 11:48:51 AM This report has been signed electronically.

## 2020-05-25 ENCOUNTER — Telehealth: Payer: Self-pay | Admitting: *Deleted

## 2020-05-25 NOTE — Telephone Encounter (Signed)
Second attempt, left VM.  

## 2020-05-25 NOTE — Telephone Encounter (Signed)
   Follow up Call-  Call back number 05/21/2020  Post procedure Call Back phone  # [phone]  Permission to leave phone message Yes  Some recent data might be hidden     First attempt for follow up phone call. No answer at number given.  Left message on voicemail.

## 2020-05-30 ENCOUNTER — Encounter: Payer: Self-pay | Admitting: Internal Medicine

## 2020-05-30 DIAGNOSIS — Z860101 Personal history of adenomatous and serrated colon polyps: Secondary | ICD-10-CM

## 2020-05-30 DIAGNOSIS — Z86010 Personal history of colonic polyps: Secondary | ICD-10-CM

## 2020-05-30 HISTORY — DX: Personal history of adenomatous and serrated colon polyps: Z86.0101

## 2020-05-30 HISTORY — DX: Personal history of colonic polyps: Z86.010

## 2020-06-06 ENCOUNTER — Other Ambulatory Visit: Payer: Self-pay | Admitting: Primary Care

## 2020-06-06 DIAGNOSIS — I10 Essential (primary) hypertension: Secondary | ICD-10-CM

## 2020-06-15 ENCOUNTER — Other Ambulatory Visit: Payer: Self-pay

## 2020-06-15 ENCOUNTER — Ambulatory Visit: Payer: Self-pay | Admitting: Registered Nurse

## 2020-06-15 VITALS — BP 132/82 | HR 61 | Temp 98.5°F

## 2020-06-15 DIAGNOSIS — R809 Proteinuria, unspecified: Secondary | ICD-10-CM

## 2020-06-15 DIAGNOSIS — R300 Dysuria: Secondary | ICD-10-CM

## 2020-06-15 DIAGNOSIS — R3129 Other microscopic hematuria: Secondary | ICD-10-CM

## 2020-06-15 LAB — POCT URINALYSIS DIPSTICK
Bilirubin, UA: NEGATIVE
Glucose, UA: NEGATIVE mg/dL
Ketones, POC UA: NEGATIVE mg/dL
Leukocytes, UA: NEGATIVE
Nitrite, UA: NEGATIVE
Specific Gravity, UA: 1.03 (ref 1.005–1.030)
Urobilinogen, UA: 0.2 E.U./dL
pH, UA: 6 (ref 5.0–8.0)

## 2020-06-15 MED ORDER — PHENAZOPYRIDINE HCL 200 MG PO TABS
200.0000 mg | ORAL_TABLET | Freq: Three times a day (TID) | ORAL | 0 refills | Status: DC
Start: 2020-06-15 — End: 2020-10-19

## 2020-06-15 NOTE — Progress Notes (Signed)
Subjective:    Patient ID: [facility], female    DOB: [birth year], 54 y.o.   MRN: [id number]  53y/o caucasian established female here for evaluation low back pain yesterday while riding lawnmower then suprapubic discomfort, urgency and frequency with intermittent discomfort with urination.  Back pain worst when mowing lawn riding lawnmower.  Denied fever/chills/headache/nausea/vomiting/diarrhea/gross hematuria/decreased urinary stream caliber or swelling of hands/feet.  Hasn't taken anything for symptoms OTC.  Per epic review last urinalysis 2017  Patient in menopause denied menses in over a year  Denied history of recurrent UTIs or kidney stones.     Review of Systems  Constitutional: Negative for activity change, appetite change, chills, diaphoresis, fatigue and fever.  HENT: Negative for trouble swallowing and voice change.   Eyes: Negative for photophobia and visual disturbance.  Respiratory: Negative for cough, shortness of breath, wheezing and stridor.   Cardiovascular: Negative for leg swelling.  Gastrointestinal: Positive for abdominal pain. Negative for abdominal distention, anal bleeding, blood in stool, diarrhea, nausea and vomiting.  Endocrine: Negative for cold intolerance and heat intolerance.  Genitourinary: Positive for dysuria, frequency and urgency. Negative for decreased urine volume, difficulty urinating, flank pain, hematuria, menstrual problem, pelvic pain, vaginal bleeding, vaginal discharge and vaginal pain.  Musculoskeletal: Positive for back pain. Negative for gait problem, neck pain and neck stiffness.  Skin: Negative for rash.  Allergic/Immunologic: Positive for environmental allergies. Negative for food allergies.  Neurological: Negative for tremors, weakness, light-headedness and headaches.  Hematological: Negative for adenopathy. Does not bruise/bleed easily.  Psychiatric/Behavioral: Negative for agitation, confusion and sleep disturbance.        Objective:   Physical Exam Vitals and nursing note reviewed.  Constitutional:      General: She is awake. She is not in acute distress.    Appearance: Normal appearance. She is well-developed and well-groomed. She is obese. She is not ill-appearing, toxic-appearing or diaphoretic.  HENT:     Head: Normocephalic and atraumatic.     Jaw: There is normal jaw occlusion.     Salivary Glands: Right salivary gland is not diffusely enlarged. Left salivary gland is not diffusely enlarged.     Right Ear: Hearing and external ear normal.     Left Ear: Hearing and external ear normal.     Nose: Nose normal.     Mouth/Throat:     Mouth: No angioedema.     Pharynx: Oropharynx is clear.  Eyes:     General: Lids are normal. Vision grossly intact. Gaze aligned appropriately. No visual field deficit or scleral icterus.       Right eye: No discharge.        Left eye: No discharge.     Extraocular Movements: Extraocular movements intact.     Conjunctiva/sclera: Conjunctivae normal.     Pupils: Pupils are equal, round, and reactive to light.  Neck:     Trachea: Trachea and phonation normal.  Cardiovascular:     Rate and Rhythm: Normal rate and regular rhythm.     Pulses: Normal pulses.          Radial pulses are 2+ on the right side and 2+ on the left side.     Heart sounds: Normal heart sounds.  Pulmonary:     Effort: Pulmonary effort is normal. No respiratory distress.     Breath sounds: Normal breath sounds and air entry. No stridor, decreased air movement or transmitted upper airway sounds. No decreased breath sounds or wheezing.     Comments: Wearing  cloth mask due to covid 19 pandemic; spoke full sentences without difficulty; no cough observed in clinic Abdominal:     General: Abdomen is flat. Bowel sounds are normal. There is no distension.     Palpations: Abdomen is soft. There is no mass.     Tenderness: There is no abdominal tenderness. There is no right CVA tenderness, left CVA  tenderness, guarding or rebound.     Hernia: No hernia is present.  Musculoskeletal:        General: No swelling, tenderness, deformity or signs of injury.     Right shoulder: Normal.     Left shoulder: Normal.     Right elbow: Normal.     Left elbow: Normal.     Right hand: Normal.     Left hand: Normal.     Cervical back: Normal, normal range of motion and neck supple. No swelling, edema, deformity, erythema, signs of trauma, lacerations, rigidity, tenderness or crepitus. Normal range of motion.     Thoracic back: Normal. No swelling, edema, deformity, signs of trauma or lacerations.     Lumbar back: No swelling, edema, deformity, signs of trauma, lacerations, spasms, tenderness or bony tenderness. Decreased range of motion. No scoliosis.     Right hip: Normal.     Left hip: Normal.     Right knee: Normal.     Left knee: Normal.     Right lower leg: Normal. No swelling, deformity, lacerations or tenderness. No edema.     Left lower leg: Normal. No swelling, deformity, lacerations or tenderness. No edema.     Right ankle: Normal. No swelling, deformity, ecchymosis or lacerations. No tenderness.     Left ankle: Normal. No swelling, deformity, ecchymosis or lacerations. No tenderness.     Comments: Lumbar pain with supine to sitting on exam table; bilateral negative CVA tenderness no muscle spasms/swelling/tenderness on palpation lumbar  Lymphadenopathy:     Head:     Right side of head: No submental, submandibular, tonsillar or preauricular adenopathy.     Left side of head: No submental, submandibular, tonsillar or preauricular adenopathy.     Cervical: No cervical adenopathy.     Right cervical: No superficial cervical adenopathy.    Left cervical: No superficial cervical adenopathy.  Skin:    General: Skin is warm and dry.     Capillary Refill: Capillary refill takes less than 2 seconds.     Coloration: Skin is not ashen, cyanotic, jaundiced, mottled, pale or sallow.     Findings:  No abrasion, abscess, acne, bruising, burn, ecchymosis, erythema, signs of injury, laceration, lesion, petechiae, rash or wound.     Nails: There is no clubbing.  Neurological:     General: No focal deficit present.     Mental Status: She is alert and oriented to person, place, and time. Mental status is at baseline.     GCS: GCS eye subscore is 4. GCS verbal subscore is 5. GCS motor subscore is 6.     Cranial Nerves: Cranial nerves are intact. No cranial nerve deficit, dysarthria or facial asymmetry.     Sensory: Sensation is intact. No sensory deficit.     Motor: Motor function is intact. No weakness, tremor, atrophy, abnormal muscle tone or seizure activity.     Coordination: Coordination is intact. Coordination normal.     Gait: Gait is intact. Gait normal.     Comments: Gait sure and steady in clinic; on/off exam table without difficulty; bilateral hand grasp equal 5/5  Psychiatric:        Attention and Perception: Attention and perception normal.        Mood and Affect: Mood and affect normal.        Speech: Speech normal.        Behavior: Behavior normal. Behavior is cooperative.        Thought Content: Thought content normal.        Cognition and Memory: Cognition and memory normal.        Judgment: Judgment normal.      Contains abnormal dataPOCT Urine Dipstick Manual No Scope Specimen:  Urine  Ref Range & Units 4 yr ago  POC Urine Color Yellow  YELLOW      POC Urine Appearance Clear  SL CLOUDY      POC Urine Glucose Negative mg/dL  Negative      POC Urine Bilirubin Negative  Negative      POC Urine Ketones Negative mg/dL  Negative      POC Urine Specific Gravity 1.005 - 1.025  1.030Abnormal      POC Urine Blood Negative  Negative      POC Urine pH Dipstick 5.5 - 6.5  5.0Abnormal      POC Urine Protein Negative mg/dL  Negative      POC Urobilinogen 0.2 - 1 mg/dL 0.2      POC Urine Nitrite Negative  Negative      POC Urine Leukocytes Negative  Negative       Strip Lot Number  [id number]      Specimen Collected: --  Date: 04/26/16  Received From: [facility]     Dipstick urinalysis with large blood, trace protein, concentrated urine 1.030, odor  Patient notified of previous and no bacteria or sediment noted in urine  Will send urine culture, push noncaffeinated fluids and start pyridium 200mg  po TID x 2 days.  Patient verbalized understanding information/instructions, agreed with plan of care and had no further questions at this time.    Assessment & Plan:  A-dysuria, proteinuria, hematuria microscopic  P-Urine culture sent to [facility] today.  Medications as directed. Patient is also to push fluids and may use Pyridium 200mg  po TID as needed dysuria/frequency #6 RF0 or buy Azo 95mg  OTC take 1-2 po every 8 hours prn x 2 days.  Urine will be gold color and can stain clothes/skin. Hydrate push noncaffeinated fluids to keep urine clear pale yellow and urinated every 2-4 hours while awake. Avoid dehydration. Avoid holding urine void on frequent basis every 4 to 6 hours.  Call or return to clinic as needed if these symptoms worsen or fail to improve as anticipated. Exitcare handout on dysuria, hematuria, proteinuria printed and given to patient.  Discussed signs and symptoms of nephrolithiasis with patient and if recurrent vomiting, worsening back pain/abdomen pain, cannot urinate every 8 hours or tea/cola colored urine seek same day re-evaluation with a provider (PCM, Urgent Care, ER).  Patient verbalized agreement and understanding of treatment plan and had no further questions at this time.  P2: Hydrate and cranberry juice

## 2020-06-15 NOTE — Patient Instructions (Addendum)
Dysuria Dysuria is pain or discomfort while urinating. The pain or discomfort may be felt in the part of your body that drains urine from the bladder (urethra) or in the surrounding tissue of the genitals. The pain may also be felt in the groin area, lower abdomen, or lower back. You may have to urinate frequently or have the sudden feeling that you have to urinate (urgency). Dysuria can affect both men and women, but it is more common in women. Dysuria can be caused by many different things, including:  Urinary tract infection.  Kidney stones or bladder stones.  Certain sexually transmitted infections (STIs), such as chlamydia.  Dehydration.  Inflammation of the tissues of the vagina.  Use of certain medicines.  Use of certain soaps or scented products that cause irritation. Follow these instructions at home: General instructions  Watch your condition for any changes.  Urinate often. Avoid holding urine for long periods of time.  After a bowel movement or urination, women should cleanse from front to back, using each tissue only once.  Urinate after sexual intercourse.  Keep all follow-up visits as told by your health care provider. This is important.  If you had any tests done to find the cause of dysuria, it is up to you to get your test results. Ask your health care provider, or the department that is doing the test, when your results will be ready. Eating and drinking   Drink enough fluid to keep your urine pale yellow.  Avoid caffeine, tea, and alcohol. They can irritate the bladder and make dysuria worse. In men, alcohol may irritate the prostate. Medicines  Take over-the-counter and prescription medicines only as told by your health care provider.  If you were prescribed an antibiotic medicine, take it as told by your health care provider. Do not stop taking the antibiotic even if you start to feel better. Contact a health care provider if:  You have a  fever.  You develop pain in your back or sides.  You have nausea or vomiting.  You have blood in your urine.  You are not urinating as often as you usually do. Get help right away if:  Your pain is severe and not relieved with medicines.  You cannot eat or drink without vomiting.  You are confused.  You have a rapid heartbeat while at rest.  You have shaking or chills.  You feel extremely weak. Summary  Dysuria is pain or discomfort while urinating. Many different conditions can lead to dysuria.  If you have dysuria, you may have to urinate frequently or have the sudden feeling that you have to urinate (urgency).  Watch your condition for any changes. Keep all follow-up visits as told by your health care provider.  Make sure that you urinate often and drink enough fluid to keep your urine pale yellow. This information is not intended to replace advice given to you by your health care provider. Make sure you discuss any questions you have with your health care provider. Document Revised: 09/07/2017 Document Reviewed: 07/12/2017 Elsevier Patient Education  [place].  Hematuria, Adult Hematuria is blood in the urine. Blood may be visible in the urine, or it may be identified with a test. This condition can be caused by infections of the bladder, urethra, kidney, or prostate. Other possible causes include:  Kidney stones.  Cancer of the urinary tract.  Too much calcium in the urine.  Conditions that are passed from parent to child (inherited conditions).  Exercise that requires a lot of energy. Infections can usually be treated with medicine, and a kidney stone usually will pass through your urine. If neither of these is the cause of your hematuria, more tests may be needed to identify the cause of your symptoms. It is very important to tell your health care provider about any blood in your urine, even if it is painless or the blood stops without treatment. Blood  in the urine, when it happens and then stops and then happens again, can be a symptom of a very serious condition, including cancer. There is no pain in the initial stages of many urinary cancers. Follow these instructions at home: Medicines  Take over-the-counter and prescription medicines only as told by your health care provider.  If you were prescribed an antibiotic medicine, take it as told by your health care provider. Do not stop taking the antibiotic even if you start to feel better. Eating and drinking  Drink enough fluid to keep your urine clear or pale yellow. It is recommended that you drink 3-4 quarts (2.8-3.8 L) a day. If you have been diagnosed with an infection, it is recommended that you drink cranberry juice in addition to large amounts of water.  Avoid caffeine, tea, and carbonated beverages. These tend to irritate the bladder.  Avoid alcohol because it may irritate the prostate (men). General instructions  If you have been diagnosed with a kidney stone, follow your health care provider's instructions about straining your urine to catch the stone.  Empty your bladder often. Avoid holding urine for long periods of time.  If you are female: ? After a bowel movement, wipe from front to back and use each piece of toilet paper only once. ? Empty your bladder before and after sex.  Pay attention to any changes in your symptoms. Tell your health care provider about any changes or any new symptoms.  It is your responsibility to get your test results. Ask your health care provider, or the department performing the test, when your results will be ready.  Keep all follow-up visits as told by your health care provider. This is important. Contact a health care provider if:  You develop back pain.  You have a fever.  You have nausea or vomiting.  Your symptoms do not improve after 3 days.  Your symptoms get worse. Get help right away if:  You develop severe vomiting and  are unable take medicine without vomiting.  You develop severe pain in your back or abdomen even though you are taking medicine.  You pass a large amount of blood in your urine.  You pass blood clots in your urine.  You feel very weak or like you might faint.  You faint. Summary  Hematuria is blood in the urine. It has many possible causes.  It is very important that you tell your health care provider about any blood in your urine, even if it is painless or the blood stops without treatment.  Take over-the-counter and prescription medicines only as told by your health care provider.  Drink enough fluid to keep your urine clear or pale yellow. This information is not intended to replace advice given to you by your health care provider. Make sure you discuss any questions you have with your health care provider. Document Revised: 02/19/2019 Document Reviewed: 10/28/2016 Elsevier Patient Education  [place].  Proteinuria Proteinuria is when there is too much protein in the urine. Proteins are important for building muscles and  bones. Proteins are also needed to fight infections, help the blood to clot, and keep body fluids in balance. Proteinuria may be mild and temporary, or it may be an early sign of kidney disease. The kidneys make urine. Healthy kidneys also keep substances like proteins from leaving the blood and ending up in the urine. What are the causes? This condition may be caused by damage to the kidneys or by temporary causes such as fever or stress. Proteinuria may happen when the kidneys are not working well. Healthy kidneys have filters (glomeruli) that keep proteins out of the urine. Proteinuria may mean that the glomeruli are damaged. The main causes of this type of damage are:  Diabetes.  High blood pressure. Other causes of kidney damage can also cause proteinuria, such as:  Diseases of the immune system, such as lupus, rheumatoid arthritis, sarcoidosis,  and Goodpasture syndrome.  Heart disease or heart failure.  Kidney infection.  Certain cancers, including kidney cancer, lymphoma, leukemia, and multiple myeloma.  Amyloidosis. This is a disease that causes abnormal proteins to build up in body tissues.  Reactions to certain medicines, such as NSAIDs.  Injuries or poisons (toxins).  High blood pressure that occurs during pregnancy (preeclampsia and eclampsia). Temporary proteinuria may result from conditions that put stress on the kidneys. These conditions usually do not cause kidney damage. They include:  Fever.  Exposure to cold or heat.  Emotional or physical stress.  Extreme exercise.  Standing for long periods of time. What increases the risk? You are more likely to develop this condition if you:  Have diabetes.  Have high blood pressure.  Have heart disease or heart failure.  Have an immune disease, cancer, or other disease that affects the kidneys.  Have a family history of kidney disease.  Are 63 years of age or older.  Are overweight.  Are of African American, American [place], Hispanic/Latino, or [place] descent.  Are pregnant.  Have an infection. What are the signs or symptoms? Mild proteinuria may not cause symptoms. As more proteins enter the urine, symptoms of kidney disease may develop, such as:  Foamy urine.  Swelling of the face, abdomen, hands, legs, or feet (edema).  Needing to urinate frequently.  Fatigue.  Difficulty sleeping.  Dry and itchy skin.  Nausea and vomiting.  Muscle cramps.  Shortness of breath. How is this diagnosed? This condition may be diagnosed with a urine test. You may have this test as part of a routine physical exam or because you have symptoms of kidney disease or risk factors for kidney disease. You may also have:  Blood tests to measure the level of a certain substance (creatinine) that increases with kidney disease.  Imaging tests of your  kidney, such as a CT scan or an ultrasound, to look for signs of kidney damage. How is this treated? If your proteinuria is mild or temporary, treatment may not be needed for this condition. Your health care provider may show you how to monitor the level of protein in your urine at home. Identifying proteinuria early is important so that the cause of the condition can be treated. Treatment for this condition depends on the cause of your proteinuria. Treatment may include:  Making diet and lifestyle changes.  Getting blood pressure under control.  Getting blood sugar under control, if you have diabetes.  Managing any other medical conditions you have that affect your kidneys.  Giving birth, if you are pregnant.  Avoiding medicines that damage your kidneys. In  severe cases, kidney disease may need to be treated with medicines or dialysis. Follow these instructions at home: Activity  Return to your normal activities as told by your health care provider. Ask your health care provider what activities are safe for you.  Ask your health care provider to recommend an exercise program. General instructions  Check your protein levels at home if directed by your health care provider.  Follow instructions from your health care provider about eating or drinking restrictions.  If you are overweight, ask your health care provider about diets that can help you get to a healthy weight.  Take over-the-counter and prescription medicines only as told by your health care provider.  Keep all follow-up visits as told by your health care provider. This is important. Contact a health care provider if:  You have new symptoms.  Your symptoms get worse or do not improve. Get help right away if you:  Have back pain.  Have diarrhea.  Vomit.  Have a fever.  Have a rash. Summary  Proteinuria is when there is too much protein in the urine.  Proteinuria may be mild and temporary, or it may be an  early sign of kidney disease.  This condition may be diagnosed with a urine test.  Treatment for this condition depends on the cause of your proteinuria.  Treatment may include diet and lifestyle changes, blood pressure and blood sugar management, and avoiding medicines that may damage the kidneys. If the proteinuria is severe, it may need to be treated with medicines or dialysis. This information is not intended to replace advice given to you by your health care provider. Make sure you discuss any questions you have with your health care provider. Document Revised: 05/13/2018 Document Reviewed: 05/13/2018 Elsevier Patient Education  [place].

## 2020-06-17 LAB — URINE CULTURE

## 2020-06-17 NOTE — Progress Notes (Signed)
Pt came by clinic and reported that symptoms were improving, but not quite resolved. She now only has mild pain to lower back and suprapubic area now. Pt reports that she has a pcp appt coming up for annual exam and plans to discuss these sx with them as well if still present when she is seen.

## 2020-06-18 NOTE — Progress Notes (Signed)
[place] paged pt with no response. RN told pt was on break when looking for her earlier today and did not locate her later in warehouse either. Will follow up Monday for additional response.

## 2020-06-18 NOTE — Progress Notes (Signed)
Noted RN Hildred Alamin tried to find patient in warehouse today without success and will follow up when clinic reopens on Monday with patient.

## 2020-06-21 ENCOUNTER — Ambulatory Visit: Payer: Self-pay | Admitting: *Deleted

## 2020-06-21 ENCOUNTER — Other Ambulatory Visit: Payer: Self-pay

## 2020-06-21 DIAGNOSIS — R3129 Other microscopic hematuria: Secondary | ICD-10-CM

## 2020-06-21 LAB — POCT URINALYSIS DIPSTICK
Bilirubin, UA: NEGATIVE
Blood, UA: NEGATIVE
Glucose, UA: NEGATIVE mg/dL
Ketones, POC UA: NEGATIVE mg/dL
Leukocytes, UA: NEGATIVE
Nitrite, UA: NEGATIVE
Protein Ur, POC: NEGATIVE mg/dL
Specific Gravity, UA: 1.03 (ref 1.005–1.030)
Urobilinogen, UA: 0.2 E.U./dL
pH, UA: 6 (ref 5.0–8.0)

## 2020-06-21 NOTE — Progress Notes (Signed)
See RN 9/13 encounter. Sx improving and UA dip improving.

## 2020-06-21 NOTE — Progress Notes (Signed)
Repeat UA dip. Pt reports still mild low back pain, midline. No flank pain. Sts suprapubic pressure is mild and intermittent, not as constant and noticeable as last week. Denies dysuria.  Repeat UA improved. Still with high specific gravity, but no blood and trace protein now negative.

## 2020-06-25 ENCOUNTER — Ambulatory Visit: Payer: PRIVATE HEALTH INSURANCE | Admitting: Primary Care

## 2020-06-29 ENCOUNTER — Encounter: Payer: Self-pay | Admitting: Primary Care

## 2020-06-29 ENCOUNTER — Ambulatory Visit: Payer: PRIVATE HEALTH INSURANCE | Admitting: Primary Care

## 2020-06-29 ENCOUNTER — Other Ambulatory Visit: Payer: Self-pay

## 2020-06-29 VITALS — BP 128/80 | HR 71 | Temp 97.0°F | Ht 66.5 in | Wt 242.0 lb

## 2020-06-29 DIAGNOSIS — Z114 Encounter for screening for human immunodeficiency virus [HIV]: Secondary | ICD-10-CM

## 2020-06-29 DIAGNOSIS — E1165 Type 2 diabetes mellitus with hyperglycemia: Secondary | ICD-10-CM | POA: Insufficient documentation

## 2020-06-29 DIAGNOSIS — Z9189 Other specified personal risk factors, not elsewhere classified: Secondary | ICD-10-CM | POA: Diagnosis not present

## 2020-06-29 DIAGNOSIS — Z1159 Encounter for screening for other viral diseases: Secondary | ICD-10-CM | POA: Diagnosis not present

## 2020-06-29 DIAGNOSIS — E119 Type 2 diabetes mellitus without complications: Secondary | ICD-10-CM

## 2020-06-29 DIAGNOSIS — Z23 Encounter for immunization: Secondary | ICD-10-CM | POA: Diagnosis not present

## 2020-06-29 LAB — POCT GLYCOSYLATED HEMOGLOBIN (HGB A1C): Hemoglobin A1C: 6.1 % — AB (ref 4.0–5.6)

## 2020-06-29 NOTE — Patient Instructions (Addendum)
You are doing a great job, continue to work on diet and exercise.   Please limit carbohydrates in the form of white bread, rice, pasta, sweets, fast food, fried food, sugary drinks, etc. Increase your consumption of fresh fruits and vegetables, whole grains, lean protein.  Ensure you are consuming 64 ounces of water daily.  Stop by the lab prior to leaving today. I will notify you of your results once received.   Appropriate times to check your blood sugar levels are:  -Before any meal (breakfast, lunch, dinner) -Two hours after any meal (breakfast, lunch, dinner) -Bedtime  Record your readings and notify me if you continue to consistently run at or above 200   Please follow up in 6 months!  It was a pleasure to see you today!   Influenza (Flu) Vaccine (Inactivated or Recombinant): What You Need to Know 1. Why get vaccinated? Influenza vaccine can prevent influenza (flu). Flu is a contagious disease that spreads around the [place] every year, usually between October and May. Anyone can get the flu, but it is more dangerous for some people. Infants and young children, people 37 years of age and older, pregnant women, and people with certain health conditions or a weakened immune system are at greatest risk of flu complications. Pneumonia, bronchitis, sinus infections and ear infections are examples of flu-related complications. If you have a medical condition, such as heart disease, cancer or diabetes, flu can make it worse. Flu can cause fever and chills, sore throat, muscle aches, fatigue, cough, headache, and runny or stuffy nose. Some people may have vomiting and diarrhea, though this is more common in children than adults. Each year thousands of people in the [place] die from flu, and many more are hospitalized. Flu vaccine prevents millions of illnesses and flu-related visits to the doctor each year. 2. Influenza vaccine CDC recommends everyone 81 months of age and older  get vaccinated every flu season. Children 6 months through 51 years of age may need 2 doses during a single flu season. Everyone else needs only 1 dose each flu season. It takes about 2 weeks for protection to develop after vaccination. There are many flu viruses, and they are always changing. Each year a new flu vaccine is made to protect against three or four viruses that are likely to cause disease in the upcoming flu season. Even when the vaccine doesn't exactly match these viruses, it may still provide some protection. Influenza vaccine does not cause flu. Influenza vaccine may be given at the same time as other vaccines. 3. Talk with your health care provider Tell your vaccine provider if the person getting the vaccine:  Has had an allergic reaction after a previous dose of influenza vaccine, or has any severe, life-threatening allergies.  Has ever had Guillain-Barr Syndrome (also called GBS). In some cases, your health care provider may decide to postpone influenza vaccination to a future visit. People with minor illnesses, such as a cold, may be vaccinated. People who are moderately or severely ill should usually wait until they recover before getting influenza vaccine. Your health care provider can give you more information. 4. Risks of a vaccine reaction  Soreness, redness, and swelling where shot is given, fever, muscle aches, and headache can happen after influenza vaccine.  There may be a very small increased risk of Guillain-Barr Syndrome (GBS) after inactivated influenza vaccine (the flu shot). Young children who get the flu shot along with pneumococcal vaccine (PCV13), and/or DTaP vaccine at the  same time might be slightly more likely to have a seizure caused by fever. Tell your health care provider if a child who is getting flu vaccine has ever had a seizure. People sometimes faint after medical procedures, including vaccination. Tell your provider if you feel dizzy or have  vision changes or ringing in the ears. As with any medicine, there is a very remote chance of a vaccine causing a severe allergic reaction, other serious injury, or death. 5. What if there is a serious problem? An allergic reaction could occur after the vaccinated person leaves the clinic. If you see signs of a severe allergic reaction (hives, swelling of the face and throat, difficulty breathing, a fast heartbeat, dizziness, or weakness), call 9-1-1 and get the person to the nearest hospital. For other signs that concern you, call your health care provider. Adverse reactions should be reported to the Vaccine Adverse Event Reporting System (VAERS). Your health care provider will usually file this report, or you can do it yourself. Visit the VAERS website at www.vaers.SamedayNews.es or call 502-729-1550.VAERS is only for reporting reactions, and VAERS staff do not give medical advice. 6. The National Vaccine Injury Compensation Program The [employer] Vaccine Injury Compensation Program (VICP) is a federal program that was created to compensate people who may have been injured by certain vaccines. Visit the VICP website at GoldCloset.com.ee or call 360-695-9380 to learn about the program and about filing a claim. There is a time limit to file a claim for compensation. 7. How can I learn more?  Ask your healthcare provider.  Call your local or state health department.  Contact the Centers for Disease Control and Prevention (CDC): ? Call 8456870033 (1-800-CDC-INFO) or ? Visit CDC's https://gibson.com/ Vaccine Information Statement (Interim) Inactivated Influenza Vaccine (05/23/2018) This information is not intended to replace advice given to you by your health care provider. Make sure you discuss any questions you have with your health care provider. Document Revised: 01/14/2019 Document Reviewed: 05/27/2018 Elsevier Patient Education  [place].

## 2020-06-29 NOTE — Progress Notes (Signed)
Subjective:    Patient ID: Diana Lutz, female    DOB: 09-07-1966, 54 y.o.   MRN: [id number]  HPI   This visit occurred during the SARS-CoV-2 public health emergency.  Safety protocols were in place, including screening questions prior to the visit, additional usage of staff PPE, and extensive cleaning of exam room while observing appropriate contact time as indicated for disinfecting solutions.   Diana Lutz is a 54 year old female with a significant history of hypertension, obstructive sleep apnea, GERD, hyperlipidemia and type 2 diabetes.   She was recently diagnosed with type 2 diabetes after recent A1C of 8.0 in June 2021 and started on Metformin 500mg  once daily.   Patient has been complaint to this new regimen.  She was checking a AM fasting blood sugar which was low 100's. She misplaced her machine 3 weeks ago so no recent readings. Plans to get a new machine.   Diet & Exercise: She has cut out sweets and drinking more water. Eliminated all regular sodas and will drink only 1 soda zero each day.  Increased protein and decreased carbohydrates in diet. Does have an active job and recently got a new lab puppy that she has been walking daily.      Eye exam: UTD Pneumonia Vaccine: Today Foot exam: Today  Ace/ARB: none Statin: Rosuvastatin     Lab Results  Component Value Date   HGBA1C 8.0 (H) 03/15/2020   Lab Results  Component Value Date   HGBA1C 6.1 (A) 06/29/2020     The 10-year ASCVD risk score Mikey Bussing DC Jr., et al., 2013) is: 7.1%   Values used to calculate the score:     Age: 65 years     Sex: Female     Is Non-Hispanic African American: No     Diabetic: Yes     Tobacco smoker: No     Systolic Blood Pressure: 638 mmHg     Is BP treated: Yes     HDL Cholesterol: 39.7 mg/dL     Total Cholesterol: 206 mg/dL   BP Readings from Last 3 Encounters:  06/29/20 128/80  06/15/20 132/82  05/21/20 (!) 91/49     Review of Systems  Constitutional:  Negative.   Respiratory: Negative.   Cardiovascular: Negative.   Gastrointestinal: Negative.  Negative for abdominal pain, diarrhea, nausea and vomiting.  Endocrine: Negative.   Genitourinary: Negative.   Skin: Negative.   Neurological: Negative.  Negative for dizziness, syncope and light-headedness.  Hematological: Negative.   Psychiatric/Behavioral: Negative.          Past Medical History:  Diagnosis Date  . Abnormal Pap smear of cervix 1991  . Anemia    history of anemia  . Diabetes mellitus without complication ([place])   . Dysplasia of cervix    cervical ca  . GERD (gastroesophageal reflux disease)   . Hx of adenomatous polyp of colon 05/30/2020  . Hypertension   . Migraines      Social History   Socioeconomic History  . Marital status: Married    Spouse name: Not on file  . Number of children: Not on file  . Years of education: Not on file  . Highest education level: Not on file  Occupational History  . Not on file  Tobacco Use  . Smoking status: Never Smoker  . Smokeless tobacco: Never Used  Vaping Use  . Vaping Use: Never used  Substance and Sexual Activity  . Alcohol use: Not Currently  Alcohol/week: 0.0 standard drinks    Comment: social  . Drug use: No  . Sexual activity: Not Currently  Other Topics Concern  . Not on file  Social History Narrative   Married.   3 children   Works for [employer] as Engineer, manufacturing systems.   Enjoys four wheeling, working on the house   Social Determinants of Health   Financial Resource Strain:   . Difficulty of Paying Living Expenses: Not on file  Food Insecurity:   . Worried About Charity fundraiser in the Last Year: Not on file  . Ran Out of Food in the Last Year: Not on file  Transportation Needs:   . Lack of Transportation (Medical): Not on file  . Lack of Transportation (Non-Medical): Not on file  Physical Activity:   . Days of Exercise per Week: Not on file  . Minutes of Exercise per Session: Not on file    Stress:   . Feeling of Stress : Not on file  Social Connections:   . Frequency of Communication with Friends and Family: Not on file  . Frequency of Social Gatherings with Friends and Family: Not on file  . Attends Religious Services: Not on file  . Active Member of Clubs or Organizations: Not on file  . Attends Archivist Meetings: Not on file  . Marital Status: Not on file  Intimate Partner Violence:   . Fear of Current or Ex-Partner: Not on file  . Emotionally Abused: Not on file  . Physically Abused: Not on file  . Sexually Abused: Not on file    Past Surgical History:  Procedure Laterality Date  . ANTERIOR AND POSTERIOR REPAIR N/A 08/18/2015   Procedure: ANTERIOR (CYSTOCELE) AND POSTERIOR REPAIR (RECTOCELE)/Perineorrhaphy;  Surgeon: Azucena Fallen, MD;  Location: [facility];  Service: Gynecology;  Laterality: N/A;  2 hrs.  Marland Kitchen BLADDER SUSPENSION N/A 08/18/2015   Procedure: TRANSVAGINAL TAPE (TVT) Mid-Urethral Sling PROCEDURE;  Surgeon: Azucena Fallen, MD;  Location: [facility];  Service: Gynecology;  Laterality: N/A;  . COLONOSCOPY  11/15/2005   normal-Dr.Gessner  . CYSTOSCOPY N/A 08/18/2015   Procedure: CYSTOSCOPY;  Surgeon: Azucena Fallen, MD;  Location: [facility];  Service: Gynecology;  Laterality: N/A;  . HYSTEROSCOPY W/ ENDOMETRIAL ABLATION    . LASER ABLATION OF THE CERVIX    . WISDOM TOOTH EXTRACTION      Family History  Problem Relation Age of Onset  . Hypertension Father   . Stroke Father   . Heart attack Father   . Prostate cancer Father   . Bladder Cancer Father   . Hypertension Mother   . Diverticulosis Mother   . Colon polyps Mother   . Hypertension Brother   . Diabetes Brother   . Colon cancer Maternal Grandmother 43  . Esophageal cancer Neg Hx   . Rectal cancer Neg Hx   . Stomach cancer Neg Hx     No Known Allergies  Current Outpatient Medications on File Prior to Visit  Medication Sig Dispense Refill  . amLODipine (NORVASC) 10 MG tablet TAKE 1 TABLET  BY MOUTH EVERY DAY FOR BLOOD PRESSURE 90 tablet 0  . calcium carbonate (TUMS - DOSED IN MG ELEMENTAL CALCIUM) 500 MG chewable tablet Chew 2 tablets by mouth daily as needed for indigestion or heartburn.    . [employer] LUBRICANT EYE DROPS 0.4-0.3 % SOLN APPLY 1 DROP TO EYE 3 (THREE) TIMES DAILY. (Patient not taking: Reported on 05/21/2020)  12  . fluticasone (FLONASE) 50 MCG/ACT nasal spray  Place 1 spray into both nostrils 2 (two) times daily. 16 g 6  . ibuprofen (ADVIL,MOTRIN) 600 MG tablet Take 600 mg by mouth daily as needed.    Marland Kitchen losartan (COZAAR) 100 MG tablet Take 1 tablet (100 mg total) by mouth daily. For blood pressure. 90 tablet 3  . metFORMIN (GLUCOPHAGE XR) 500 MG 24 hr tablet Take 1 tablet (500 mg total) by mouth daily with breakfast. For diabetes. 90 tablet 1  . omeprazole (PRILOSEC) 20 MG capsule Take 1 capsule (20 mg total) by mouth daily. For heartburn. 90 capsule 1  . phenazopyridine (PYRIDIUM) 200 MG tablet Take 1 tablet (200 mg total) by mouth 3 (three) times daily. 6 tablet 0  . rosuvastatin (CRESTOR) 5 MG tablet Take 1 tablet (5 mg total) by mouth every evening. For cholesterol. 90 tablet 3  . sodium chloride (OCEAN) 0.65 % SOLN nasal spray Place 2 sprays into both nostrils every 2 (two) hours while awake.  0   No current facility-administered medications on file prior to visit.    BP 128/80   Pulse 71   Temp (!) 97 F (36.1 C) (Temporal)   Ht 5' 6.5" (1.689 m)   Wt 242 lb (109.8 kg)   BMI 38.47 kg/m    Objective:   Physical Exam Constitutional:      Appearance: Normal appearance.  Cardiovascular:     Pulses: Normal pulses.  Pulmonary:     Effort: Pulmonary effort is normal.     Breath sounds: Normal breath sounds.  Musculoskeletal:     Cervical back: Normal range of motion and neck supple.  Skin:    General: Skin is warm and dry.     Capillary Refill: Capillary refill takes less than 2 seconds.  Neurological:     General: No focal deficit present.     Mental  Status: She is alert and oriented to person, place, and time.           Assessment & Plan:

## 2020-06-29 NOTE — Addendum Note (Signed)
Addended by: Francella Solian on: 06/29/2020 01:35 PM   Modules accepted: Orders

## 2020-06-29 NOTE — Assessment & Plan Note (Addendum)
A1C improved from 8.0 (6/21) to 6.1 Today   Compliant to Metformin.   Foot exam completed today. Continue metformin & working on lifestyle changes such as diet and exercise.   Follow up in 6 months.   Agree with assessment and plan. Pleas Koch, NP

## 2020-06-29 NOTE — Progress Notes (Signed)
Subjective:    Patient ID: Diana Lutz, female    DOB: 09/01/1966, 54 y.o.   MRN: [id number]  HPI  This visit occurred during the SARS-CoV-2 public health emergency.  Safety protocols were in place, including screening questions prior to the visit, additional usage of staff PPE, and extensive cleaning of exam room while observing appropriate contact time as indicated for disinfecting solutions.   Diana Lutz is a 53 year old female with a history of hypertension, OSA, prediabetes, type 2 diabetes, GERD who presents today for follow up of diabetes.  Current medications include: Metformin XR 500 mg daily.    She is checking her blood glucose infrequently. When she has checked glucose levels it has run in the low 100's AM fasting.   Last A1C: 8.0 in June 2021, 6.1 today Last Eye Exam: Due Last Foot Exam: Due Pneumonia Vaccination: Never completed  ACE/ARB: Losartan  Statin: Crestor  BP Readings from Last 3 Encounters:  06/29/20 128/80  06/15/20 132/82  05/21/20 (!) 91/49   Since her last visit she's significantly changed her diet but cutting out soda and sugar. She recently got a new puppy and is walking her puppy daily.   Review of Systems  Eyes: Negative for visual disturbance.  Respiratory: Negative for shortness of breath.   Cardiovascular: Negative for chest pain.  Neurological: Negative for dizziness and numbness.       Past Medical History:  Diagnosis Date  . Abnormal Pap smear of cervix 1991  . Anemia    history of anemia  . Diabetes mellitus without complication ([place])   . Dysplasia of cervix    cervical ca  . GERD (gastroesophageal reflux disease)   . Hx of adenomatous polyp of colon 05/30/2020  . Hypertension   . Migraines      Social History   Socioeconomic History  . Marital status: Married    Spouse name: Not on file  . Number of children: Not on file  . Years of education: Not on file  . Highest education level: Not on file    Occupational History  . Not on file  Tobacco Use  . Smoking status: Never Smoker  . Smokeless tobacco: Never Used  Vaping Use  . Vaping Use: Never used  Substance and Sexual Activity  . Alcohol use: Not Currently    Alcohol/week: 0.0 standard drinks    Comment: social  . Drug use: No  . Sexual activity: Not Currently  Other Topics Concern  . Not on file  Social History Narrative   Married.   3 children   Works for [employer] as Engineer, manufacturing systems.   Enjoys four wheeling, working on the house   Social Determinants of Health   Financial Resource Strain:   . Difficulty of Paying Living Expenses: Not on file  Food Insecurity:   . Worried About Charity fundraiser in the Last Year: Not on file  . Ran Out of Food in the Last Year: Not on file  Transportation Needs:   . Lack of Transportation (Medical): Not on file  . Lack of Transportation (Non-Medical): Not on file  Physical Activity:   . Days of Exercise per Week: Not on file  . Minutes of Exercise per Session: Not on file  Stress:   . Feeling of Stress : Not on file  Social Connections:   . Frequency of Communication with Friends and Family: Not on file  . Frequency of Social Gatherings with Friends and Family: Not on  file  . Attends Religious Services: Not on file  . Active Member of Clubs or Organizations: Not on file  . Attends Archivist Meetings: Not on file  . Marital Status: Not on file  Intimate Partner Violence:   . Fear of Current or Ex-Partner: Not on file  . Emotionally Abused: Not on file  . Physically Abused: Not on file  . Sexually Abused: Not on file    Past Surgical History:  Procedure Laterality Date  . ANTERIOR AND POSTERIOR REPAIR N/A 08/18/2015   Procedure: ANTERIOR (CYSTOCELE) AND POSTERIOR REPAIR (RECTOCELE)/Perineorrhaphy;  Surgeon: Azucena Fallen, MD;  Location: [facility];  Service: Gynecology;  Laterality: N/A;  2 hrs.  Marland Kitchen BLADDER SUSPENSION N/A 08/18/2015   Procedure: TRANSVAGINAL  TAPE (TVT) Mid-Urethral Sling PROCEDURE;  Surgeon: Azucena Fallen, MD;  Location: [facility];  Service: Gynecology;  Laterality: N/A;  . COLONOSCOPY  11/15/2005   normal-Dr.Gessner  . CYSTOSCOPY N/A 08/18/2015   Procedure: CYSTOSCOPY;  Surgeon: Azucena Fallen, MD;  Location: [facility];  Service: Gynecology;  Laterality: N/A;  . HYSTEROSCOPY W/ ENDOMETRIAL ABLATION    . LASER ABLATION OF THE CERVIX    . WISDOM TOOTH EXTRACTION      Family History  Problem Relation Age of Onset  . Hypertension Father   . Stroke Father   . Heart attack Father   . Prostate cancer Father   . Bladder Cancer Father   . Hypertension Mother   . Diverticulosis Mother   . Colon polyps Mother   . Hypertension Brother   . Diabetes Brother   . Colon cancer Maternal Grandmother 33  . Esophageal cancer Neg Hx   . Rectal cancer Neg Hx   . Stomach cancer Neg Hx     No Known Allergies  Current Outpatient Medications on File Prior to Visit  Medication Sig Dispense Refill  . amLODipine (NORVASC) 10 MG tablet TAKE 1 TABLET BY MOUTH EVERY DAY FOR BLOOD PRESSURE 90 tablet 0  . calcium carbonate (TUMS - DOSED IN MG ELEMENTAL CALCIUM) 500 MG chewable tablet Chew 2 tablets by mouth daily as needed for indigestion or heartburn.    . [employer] LUBRICANT EYE DROPS 0.4-0.3 % SOLN APPLY 1 DROP TO EYE 3 (THREE) TIMES DAILY.  12  . ibuprofen (ADVIL,MOTRIN) 600 MG tablet Take 600 mg by mouth daily as needed.    Marland Kitchen losartan (COZAAR) 100 MG tablet Take 1 tablet (100 mg total) by mouth daily. For blood pressure. 90 tablet 3  . metFORMIN (GLUCOPHAGE XR) 500 MG 24 hr tablet Take 1 tablet (500 mg total) by mouth daily with breakfast. For diabetes. 90 tablet 1  . omeprazole (PRILOSEC) 20 MG capsule Take 1 capsule (20 mg total) by mouth daily. For heartburn. 90 capsule 1  . phenazopyridine (PYRIDIUM) 200 MG tablet Take 1 tablet (200 mg total) by mouth 3 (three) times daily. 6 tablet 0  . rosuvastatin (CRESTOR) 5 MG tablet Take 1 tablet (5 mg total) by  mouth every evening. For cholesterol. 90 tablet 3  . fluticasone (FLONASE) 50 MCG/ACT nasal spray Place 1 spray into both nostrils 2 (two) times daily. 16 g 6  . sodium chloride (OCEAN) 0.65 % SOLN nasal spray Place 2 sprays into both nostrils every 2 (two) hours while awake.  0   No current facility-administered medications on file prior to visit.    BP 128/80   Pulse 71   Temp (!) 97 F (36.1 C) (Temporal)   Ht 5' 6.5" (1.689 m)  Wt 242 lb (109.8 kg)   BMI 38.47 kg/m    Objective:   Physical Exam Cardiovascular:     Rate and Rhythm: Normal rate and regular rhythm.  Pulmonary:     Effort: Pulmonary effort is normal.     Breath sounds: Normal breath sounds.  Musculoskeletal:     Cervical back: Neck supple.  Skin:    General: Skin is warm and dry.            Assessment & Plan:

## 2020-06-30 LAB — HEPATITIS C ANTIBODY
Hepatitis C Ab: NONREACTIVE
SIGNAL TO CUT-OFF: 0.01 (ref <1.00)

## 2020-06-30 LAB — HIV ANTIBODY (ROUTINE TESTING W REFLEX): HIV 1&2 Ab, 4th Generation: NONREACTIVE

## 2020-07-20 ENCOUNTER — Ambulatory Visit: Payer: PRIVATE HEALTH INSURANCE | Admitting: Primary Care

## 2020-08-02 LAB — HM MAMMOGRAPHY

## 2020-08-05 ENCOUNTER — Encounter: Payer: Self-pay | Admitting: Primary Care

## 2020-09-06 ENCOUNTER — Other Ambulatory Visit: Payer: Self-pay | Admitting: Primary Care

## 2020-09-06 DIAGNOSIS — I10 Essential (primary) hypertension: Secondary | ICD-10-CM

## 2020-09-27 ENCOUNTER — Encounter: Payer: Self-pay | Admitting: *Deleted

## 2020-09-27 ENCOUNTER — Telehealth: Payer: Self-pay | Admitting: *Deleted

## 2020-09-27 DIAGNOSIS — U071 COVID-19: Secondary | ICD-10-CM

## 2020-09-27 NOTE — Telephone Encounter (Signed)
Reviewed RN Hildred Alamin note agree with plan of care will follow up with patient tomorrow.

## 2020-09-27 NOTE — Telephone Encounter (Signed)
RN notified by HR that pt tested positive test. Spoke with pt over phone. She reports having mild HA and body aches on Friday 12/17. Progressed on Saturday to cough, headache, chills, body aches, nasal congestion, rhinorrhea, pressure behind eyes. Cough becoming productive today 09/27/20 with mild chest congestion.  She took a rapid at home Covid test 12/19 that was positive. She has been taking Ibuprofen and Emergen-C (Vit C, Vit D, Zinc) at home. Discussed use of Mucinex DM (guafinesin, dextromethorphan) and phenylephrine for sx management. Denies bad taste in mouth, yellow/green mucus, dental pain. Denies ShOB, difficulty breathing. Does not feel she needs any Rx meds at this time. Managing with OTCs.   Last day on-site: 09/24/20 Day 1 of Sx: 09/24/20 Day 1 of quarantine: 09/25/20 Testing between sx days 3-5 Day 10 quarantine: 10/04/20 RTW date: 10/05/20  Pt is interested in MAB infusion. Referral placed through Secure Chat to MAB infusion group.   Also discussed spouse's test dates as he tested negative and is asymptomatic. Screening test in 5-7 or start quaratine and test 3-5 days after if sx start.

## 2020-09-28 ENCOUNTER — Encounter: Payer: Self-pay | Admitting: Nurse Practitioner

## 2020-09-28 ENCOUNTER — Telehealth (HOSPITAL_COMMUNITY): Payer: Self-pay | Admitting: Nurse Practitioner

## 2020-09-28 DIAGNOSIS — U071 COVID-19: Secondary | ICD-10-CM

## 2020-09-28 NOTE — Telephone Encounter (Signed)
Called to Discuss with patient about Covid symptoms and the use of a monoclonal antibody infusion for those with mild to moderate Covid symptoms and at a high risk of hospitalization.     Pt is qualified for this infusion at the [facility] infusion center due to co-morbid conditions and/or a member of an at-risk group.     Unable to reach pt. Left message to return call. Sent mychart message.   Gladys Deckard, DNP, AGNP-C [phone] (Infusion Center Hotline)  

## 2020-09-30 NOTE — Telephone Encounter (Signed)
RN placed call to pt for sx check. Pt reports increased chest congestion this morning. Still with HA, pressure behind eyes, rhinorrhea. Thick white phlegm. Still with no bad taste in mouth, dental pain or yellow/green mucus. Is taking Emergen-C, Mucinex DM at home.  Does not feel she needs Rx meds or NP appt at this time. Wants to see how the weekend goes with OTC med management.   Noted MyChart mesgs with monoclonal antibody infusion clinic this morning where pt was made aware that due to short supply of infusions, only the unvaccinated are currently able to receive the infusions.    No questions/concerns today. Plan for f/u on 12/27 prior to RTW 10/05/20.

## 2020-09-30 NOTE — Telephone Encounter (Signed)
Reviewed RN Hildred Alamin note will follow up with patient via telephone over holiday weekend.

## 2020-10-02 NOTE — Telephone Encounter (Signed)
Telephone message left for patient can email [email or web address] this weekend if advice needed.  RN Hildred Alamin back in clinic Monday 27 Dec [phone].

## 2020-10-04 MED ORDER — ALBUTEROL SULFATE HFA 108 (90 BASE) MCG/ACT IN AERS: 1.0000 | INHALATION_SPRAY | Freq: Four times a day (QID) | RESPIRATORY_TRACT | 0 refills | Status: DC | PRN

## 2020-10-04 NOTE — Telephone Encounter (Signed)
Spoke with pt over phone. She is scheduled to RTW tomorrow however sx are not improving. She reports having diarrhea start 2 days ago. Discussed use of Imodium and pushing fluids with this. Also with increasing ShOB. Reports having a pulse ox at home and readings have been high 90s. Discussed if staying consistently lower than 93% to contact clinic. Asked if she felt she needed an inhaler and she declined but due to [facility] complaint, sent one to her pharmacy of choice. She reports chest congestion and mucus has been thick white. Nasal drainage is still clear. Denies bad taste in mouth or dental pain. She continues taking Mucinex DM, Emergen-C, phenylephrine, Tylenol. Declines any other Rx meds at this time. Advised pt that due to continued/worsening sx, will keep her out of work additional time and f/u tomorrow for further determinations.

## 2020-10-05 NOTE — Telephone Encounter (Signed)
Pt called RN and notified that she felt better today and planned to RTW tomorrow. Pt cleared to return. She reports no n/v, fever/chills. Diarrhea improved after Imodium. 96% pulse ox this morning. Did pick up and start using inhaler and this has improved her cough and ShOB. HR made aware of her planned return. No further needs.

## 2020-10-07 NOTE — Telephone Encounter (Signed)
Noted will attempt follow up via telephone again this week.

## 2020-10-07 NOTE — Telephone Encounter (Signed)
Called pt to check on sx and verfiy RTW. No answer. LVMRCB.

## 2020-10-08 NOTE — Telephone Encounter (Signed)
Late entry: Patient presented to Clinic yesterday 10/07/2020 after receiving RN's VM. Feels better overall. Still using albuterol inhaler prn. Still with mild chest congestion and productive cough but feels this is clearing. Denies further questions/concerns. Closing encounter.

## 2020-10-12 NOTE — Telephone Encounter (Signed)
Noted patient feeling better and RTW no further questions or concerns per RN Rolly Salter note.

## 2020-10-18 ENCOUNTER — Telehealth: Payer: Self-pay | Admitting: *Deleted

## 2020-10-18 ENCOUNTER — Encounter: Payer: Self-pay | Admitting: *Deleted

## 2020-10-18 DIAGNOSIS — U071 COVID-19: Secondary | ICD-10-CM

## 2020-10-18 NOTE — Telephone Encounter (Signed)
Reviewed RN Hildred Alamin note and patient scheduled for virtual appt with me tomorrow.

## 2020-10-18 NOTE — Telephone Encounter (Signed)
RN notified by HR that pt called out of work today for sore throat. Spoke with pt by phone. She reports that sx started Saturday 1/8. Chest congestion, cough, sore throat. Bilateral otalgia. Frontal HA. Mucus clear. Also with diarrhea and a low grade fever.  Denies chills, body aches.   She did have a positive rapid Covid test 09/26/20. Reports all sx had resolved and she had RTW 10/08/20. Feels these sx are very similar and just started back up.    At home, she is using Mucinex, Emergen-C, Tylenol. Recommended Delsym at night. Using albuterol inh prn. Since still within her 90 days and with same sx, will not require testing and will plan RTW around resolution of sx. HR made aware of same. Virtual NP appt made for tomorrow 10/19/20.

## 2020-10-19 ENCOUNTER — Encounter: Payer: Self-pay | Admitting: Registered Nurse

## 2020-10-19 ENCOUNTER — Other Ambulatory Visit: Payer: Self-pay

## 2020-10-19 ENCOUNTER — Telehealth: Payer: Self-pay | Admitting: Registered Nurse

## 2020-10-19 VITALS — Resp 18

## 2020-10-19 DIAGNOSIS — J019 Acute sinusitis, unspecified: Secondary | ICD-10-CM

## 2020-10-19 DIAGNOSIS — J209 Acute bronchitis, unspecified: Secondary | ICD-10-CM

## 2020-10-19 MED ORDER — PREDNISONE 10 MG PO TABS: ORAL_TABLET | ORAL | 0 refills | Status: AC

## 2020-10-19 MED ORDER — FLUTICASONE PROPIONATE 50 MCG/ACT NA SUSP
1.0000 | Freq: Two times a day (BID) | NASAL | 6 refills | Status: DC
Start: 2020-10-19 — End: 2021-09-08

## 2020-10-19 MED ORDER — SALINE SPRAY 0.65 % NA SOLN: 2.0000 | NASAL | 0 refills | Status: DC

## 2020-10-19 MED ORDER — AMOXICILLIN-POT CLAVULANATE 875-125 MG PO TABS: 1.0000 | ORAL_TABLET | Freq: Two times a day (BID) | ORAL | 0 refills | Status: AC

## 2020-10-19 NOTE — Progress Notes (Signed)
Subjective:    Patient ID: Diana Lutz, female    DOB: Apr 19, 1966, 55 y.o.   MRN: [id number]  54y/o caucasian female established patient for virtual visit today.  Unable to connect to video.  Low grade fever yesterday 99.17F usual 96-9F orally.  Diarrhea resolved.  Phlegm yellow from chest; clear from nose, nasal congested today.  Coughing.  Early this am wheezing but resolved prior to appt.  Using albuterol inhaler twice a day.  Blood sugars 70-80 fasting and 120 after eating.  Denied nausea/vomiting.  Ears achy on and off denied drainage on pillow.  Tolerating po intake without difficulty.  Patient consented to video/telephone visit.  This visit was conducted entirely via telephone.  I spent 15 minutes on the telephone with patient.  This call was 100% audio only. Patient was unable to connect via video due to pop up blocker and couldn't get it shut off  RN notified 10/18/20 by HR that pt called out of work today for sore throat. RN Hildred Alamin spoke with pt by phone. She reports that sx started Saturday 1/8. Chest congestion, cough, sore throat. Bilateral otalgia. Frontal HA. Mucus clear. Also with diarrhea and a low grade fever.  Denied chills, body aches.  Patient with a positive rapid Covid test 09/26/20. Reports all sx had resolved and she had RTW 10/08/20. Feels these sx are very similar and just started back up.  At home, she is using Mucinex, Emergen-C, Tylenol. Recommended Delsym at night. Using albuterol inh prn. Since still within her 90 days and with same sx.  Denied new known sick contacts.     Review of Systems  Constitutional: Positive for activity change, appetite change, fatigue and fever. Negative for diaphoresis.  HENT: Positive for congestion, postnasal drip, rhinorrhea, sore throat and voice change. Negative for dental problem, drooling, ear discharge, facial swelling, hearing loss, mouth sores, nosebleeds, tinnitus and trouble swallowing.   Eyes: Negative for photophobia  and visual disturbance.  Respiratory: Positive for cough, shortness of breath and wheezing. Negative for choking and stridor.   Cardiovascular: Negative for chest pain.  Gastrointestinal: Positive for diarrhea. Negative for abdominal pain, nausea and vomiting.  Endocrine: Negative for cold intolerance and heat intolerance.  Genitourinary: Negative for difficulty urinating.  Musculoskeletal: Negative for gait problem, joint swelling, neck pain and neck stiffness.  Skin: Negative for rash.  Allergic/Immunologic: Positive for immunocompromised state. Negative for food allergies.  Neurological: Negative for dizziness, tremors, seizures, syncope, facial asymmetry, speech difficulty, weakness, light-headedness and numbness.  Hematological: Negative for adenopathy. Does not bruise/bleed easily.  Psychiatric/Behavioral: Negative for agitation, confusion and sleep disturbance.       Objective:   Physical Exam Vitals and nursing note reviewed.  Constitutional:      General: She is awake. She is not in acute distress. HENT:     Right Ear: Hearing normal.     Left Ear: Hearing normal.     Nose: Congestion present.     Mouth/Throat:     Pharynx: Oropharynx is clear.  Neck:     Trachea: Phonation normal.     Comments: Voice a little hoarse Pulmonary:     Effort: Pulmonary effort is normal. No respiratory distress.     Breath sounds: Normal air entry. No stridor. No wheezing.     Comments: Spoke full sentences without difficulty; rare dry moderate cough at times barky; respirations even and unlabored Neurological:     General: No focal deficit present.     Mental Status: She is  alert and oriented to person, place, and time. Mental status is at baseline.     GCS: GCS verbal subscore is 5.  Psychiatric:        Attention and Perception: Attention and perception normal.        Mood and Affect: Mood normal.        Speech: Speech normal.        Behavior: Behavior normal. Behavior is cooperative.         Thought Content: Thought content normal.        Cognition and Memory: Cognition and memory normal.        Judgment: Judgment normal.       Congested occasional nasal sniffing/throat clearing and barky nonproductive cough during telephone visit    Assessment & Plan:  A-acute bronchitis and acute rhinosinusitis  P-History of positive covid test Dec 2021.  Two pfizer covid vaccinations last 01/14/2020.  Respiratory symptoms had improved now worsening. Sp02 stable home monitor.  Discussed if worsening especially not improving with rest/deep breathing/albuterol inhaler administration to contact clinic staff same day or PCM office. Could be flu versus covid another variant.  Has taken oral prednisone previously for bronchitis with good results.  Lower dose per PCM  Start Prednisone 10mg  taper po with breakfast 30mg x3days, 20mg x3 days, 10mg x3 days #21 RF0 dispensed from [facility] to patient's daughter Thomasenia Sales) to bring to her today.  Discussed if receives before dinner may start prednisone today otherwise start tomorrow with breakfast.  Will  Call tomorrow afternoon to see how symptoms responding to prednisone and increasing albuterol inhaler use to q4-6h as I do not feel BID controlling symptoms adequately. May use OTC cough lozenges no sugar prn cough per manufacturer instructions.  Discussed possible side effects increased/decreased appetite, difficulty sleeping, increased blood sugar, increased blood pressure and heart rate.  Albuterol MDI 19mcg 1-2 puffs po q4-6h prn protracted cough/wheeze/chest tightness side effect increased heart rate. Bronchitis simple, community acquired, may have started as viral (probably respiratory syncytial, parainfluenza, influenza, or adenovirus), but now evidence of acute purulent bronchitis with resultant bronchial edema and mucus formation. Discussed flu, covid and other viral URIs are common in community at this time.  Patient did have flu vaccine Sep 2021.  It is  possible she had delta variant then caught omicron or flu now also.  Discussed mucous color consistant with viral illness typically clear to yellow to green then done but if pink/tan completely opaque mucous then to start augmentin 875mg  po BID x 10 days #20 RF0 dispensed from [facility] today.  Or if worsening sinus pain/pressure despite 48 hours use of prednisone/nasal saline/flonase.   Viruses are the most common cause of bronchial inflammation in otherwise healthy adults with acute bronchitis.  The appearance of sputum is not predictive of whether a bacterial infection is present.  Purulent sputum is most often caused by viral infections.  There are a small portion of those caused by non-viral agents being Mycoplama pneumonia.  Microscopic examination or C&S of sputum in the healthy adult with acute bronchitis is generally not helpful (usually negative or normal respiratory flora) other considerations being cough from upper respiratory tract infections, sinusitis or allergic syndromes (mild asthma or viral pneumonia).  Differential Diagnoses:  reactive airway disease (asthma, allergic aspergillosis (eosinophilia), chronic bronchitis, respiratory infection (sinusitis, common cold, pneumonia), congestive heart failure, reflux esophagitis, bronchogenic tumor, aspiration syndromes and/or exposure to pulmonary irritants/smoke.   Without high fever, severe dyspnea, lack of physical findings or other risk factors, I will hold  on a chest radiograph and CBC at this time.  If worsening despite 96 hours prednisone and augmentin 48 hours and using albuterol q4-6h ATC, sp02 decreasing towards 90 then I recommend in person visit/chest xray with PCM/UC.  I discussed that approximately 50% of patients with acute bronchitis have a cough that lasts up to three weeks, and 25% for over a month.  Tylenol 500mg  one to two tablets every four to six hours as needed for fever or myalgias.  Exitcare handouts on bronchitis and inhaler use  printed and sent with daughter for patient.  ER if hemopthysis, SOB, worst chest pain of life.  Patient verbalized agreement and understanding of treatment plan.  P2:  hand washing and cover cough.   May use tylenol 1000mg  po q6h prn pain/fever.  augmentin 875mg  po BID x 10 days #20 RF0 dispensed from [facility]  Nasal saline 1 bottle from clinic stock sent with daughter for patient today.  Ensure flonase 1 spray each nostril BID, saline 2 sprays each nostril q2h wa prn congestion.  Patient also started on prednisone taper oral 10mg  (30x3, 20x3, 10mg x3) today for bronchitis but should help sinusitis symptoms also.  If no improvement with 48 hours of saline and flonase use start augmentin 875mg  po BID x 10 days #20 RF0 dispensed from [facility] to patient  Denied personal or family history of ENT cancer.  Shower BID especially prior to bed. No evidence of systemic bacterial infection, non toxic and well hydrated.  I do not see where any further testing or imaging is necessary at this time.   I will suggest supportive care, rest, good hygiene and encourage the patient to take adequate fluids.  The patient is to return to clinic or EMERGENCY ROOM if symptoms worsen or change significantly.  Exitcare handouts printed and given on sinusitis and sinus rinse.  Patient verbalized agreement and understanding of treatment plan and had no further questions at this time.   P2:  Hand washing and cover cough

## 2020-10-19 NOTE — Patient Instructions (Addendum)
Nasal saline 2 sprays each nostril every 2 hours as needed congestion/dryness flonase 1 spray each nostril twice a day Honey 1 tablespoon every 4 hours as needed for cough Consider sugar free cough lozenges per manufacturer instructions Prednisone 3 tabs x 3 days with breakfast; 2 tabs x 3 days with breakfast then 1 tab x 3 days by mouth   Start augmentin 875mg  by mouth every 12 hours x 10 days if chunky sputum/nasal discharge, worsening fever/breathing, white ear discharge  Monitor oxygen level and if decreasing to 90-91 and not going back up with rest/deep breaths/inhaler use contact clinic staff/PCM same day  Viral Respiratory Infection A respiratory infection is an illness that affects part of the respiratory system, such as the lungs, nose, or throat. A respiratory infection that is caused by a virus is called a viral respiratory infection. Common types of viral respiratory infections include:  A cold.  The flu (influenza).  A respiratory syncytial virus (RSV) infection. What are the causes? This condition is caused by a virus. What are the signs or symptoms? Symptoms of this condition include:  A stuffy or runny nose.  Yellow or green nasal discharge.  A cough.  Sneezing.  Fatigue.  Achy muscles.  A sore throat.  Sweating or chills.  A fever.  A headache. How is this diagnosed? This condition may be diagnosed based on:  Your symptoms.  A physical exam.  Testing of nasal swabs. How is this treated? This condition may be treated with medicines, such as:  Antiviral medicine. This may shorten the length of time a person has symptoms.  Expectorants. These make it easier to cough up mucus.  Decongestant nasal sprays.  Acetaminophen or NSAIDs to relieve fever and pain. Antibiotic medicines are not prescribed for viral infections. This is because antibiotics are designed to kill bacteria. They are not effective against viruses. Follow these instructions at  home: Managing pain and congestion  Take over-the-counter and prescription medicines only as told by your health care provider.  If you have a sore throat, gargle with a salt-water mixture 3-4 times a day or as needed. To make a salt-water mixture, completely dissolve -1 tsp of salt in 1 cup of warm water.  Use nose drops made from salt water to ease congestion and soften raw skin around your nose.  Drink enough fluid to keep your urine pale yellow. This helps prevent dehydration and helps loosen up mucus. General instructions  Rest as much as possible.  Do not drink alcohol.  Do not use any products that contain nicotine or tobacco, such as cigarettes and e-cigarettes. If you need help quitting, ask your health care provider.  Keep all follow-up visits as told by your health care provider. This is important.   How is this prevented?  Get an annual flu shot. You may get the flu shot in late summer, fall, or winter. Ask your health care provider when you should get your flu shot.  Avoid exposing others to your respiratory infection. ? Stay home from work or school as told by your health care provider. ? Wash your hands with soap and water often, especially after you cough or sneeze. If soap and water are not available, use alcohol-based hand sanitizer.  Avoid contact with people who are sick during cold and flu season. This is generally fall and winter.   Contact a health care provider if:  Your symptoms last for 10 days or longer.  Your symptoms get worse over time.  You  have a fever.  You have severe sinus pain in your face or forehead.  The glands in your jaw or neck become very swollen. Get help right away if you:  Feel pain or pressure in your chest.  Have shortness of breath.  Faint or feel like you will faint.  Have severe and persistent vomiting.  Feel confused or disoriented. Summary  A respiratory infection is an illness that affects part of the  respiratory system, such as the lungs, nose, or throat. A respiratory infection that is caused by a virus is called a viral respiratory infection.  Common types of viral respiratory infections are a cold, influenza, and respiratory syncytial virus (RSV) infection.  Symptoms of this condition include a stuffy or runny nose, cough, sneezing, fatigue, achy muscles, sore throat, and fevers or chills.  Antibiotic medicines are not prescribed for viral infections. This is because antibiotics are designed to kill bacteria. They are not effective against viruses. This information is not intended to replace advice given to you by your health care provider. Make sure you discuss any questions you have with your health care provider. Document Revised: 10/03/2018 Document Reviewed: 11/05/2017 Elsevier Patient Education  2021 [place]. How to Perform a Sinus Rinse A sinus rinse is a home treatment that is used to rinse your sinuses with a sterile mixture of salt and water (saline solution). Sinuses are air-filled spaces in your skull behind the bones of your face and forehead that open into your nasal cavity. A sinus rinse can help to clear mucus, dirt, dust, or pollen from your nasal cavity. You may do a sinus rinse when you have a cold, a virus, nasal allergy symptoms, a sinus infection, or stuffiness in your nose or sinuses. Talk with your health care provider about whether a sinus rinse might help you. What are the risks? A sinus rinse is generally safe and effective. However, there are a few risks, which include:  A burning sensation in your sinuses. This may happen if you do not make the saline solution as directed. Be sure to follow all directions when making the saline solution.  Nasal irritation.  Infection from contaminated water. This is rare, but possible. Do not do a sinus rinse if you have had ear or nasal surgery, ear infection, or blocked ears. Supplies needed:  Saline solution or  powder.  Distilled or sterile water to mix with saline powder. ? You may use boiled and cooled tap water. Boil tap water for 5 minutes; cool until it is lukewarm. Use within 24 hours. ? Do not use regular tap water to mix with the saline solution.  Neti pot or nasal rinse bottle. These supplies release the saline solution into your nose and through your sinuses. Neti pots and nasal rinse bottles can be purchased at [employer], a health food store, or online. How to perform a sinus rinse 1. Wash your hands with soap and water. 2. Wash your device according to the directions that came with the product and then dry it. 3. Use the solution that comes with your product or one that is sold separately in stores. Follow the mixing directions on the package to mix with sterile or distilled water. 4. Fill the device with the amount of saline solution noted in the device instructions. 5. Stand over a sink and tilt your head sideways over the sink. 6. Place the spout of the device in your upper nostril (the one closer to the ceiling). 7. Gently pour  or squeeze the saline solution into your nasal cavity. The liquid should drain out from the lower nostril if you are not too congested. 8. While rinsing, breathe through your open mouth. 9. Gently blow your nose to clear any mucus and rinse solution. Blowing too hard may cause ear pain. 10. Repeat in your other nostril. 11. Clean and rinse your device with clean water and then air-dry it. Talk with your health care provider or pharmacist if you have questions about how to do a sinus rinse.   Summary  A sinus rinse is a home treatment that is used to rinse your sinuses with a sterile mixture of salt and water (saline solution).  A sinus rinse is generally safe and effective. Follow all instructions carefully.  Before doing a sinus rinse, talk with your health care provider about whether it would be helpful for you. This information is not intended  to replace advice given to you by your health care provider. Make sure you discuss any questions you have with your health care provider. Document Revised: 07/06/2020 Document Reviewed: 07/06/2020 Elsevier Patient Education  2021 [place]. Sinusitis, Adult Sinusitis is inflammation of your sinuses. Sinuses are hollow spaces in the bones around your face. Your sinuses are located:  Around your eyes.  In the middle of your forehead.  Behind your nose.  In your cheekbones. Mucus normally drains out of your sinuses. When your nasal tissues become inflamed or swollen, mucus can become trapped or blocked. This allows bacteria, viruses, and fungi to grow, which leads to infection. Most infections of the sinuses are caused by a virus. Sinusitis can develop quickly. It can last for up to 4 weeks (acute) or for more than 12 weeks (chronic). Sinusitis often develops after a cold. What are the causes? This condition is caused by anything that creates swelling in the sinuses or stops mucus from draining. This includes:  Allergies.  Asthma.  Infection from bacteria or viruses.  Deformities or blockages in your nose or sinuses.  Abnormal growths in the nose (nasal polyps).  Pollutants, such as chemicals or irritants in the air.  Infection from fungi (rare). What increases the risk? You are more likely to develop this condition if you:  Have a weak body defense system (immune system).  Do a lot of swimming or diving.  Overuse nasal sprays.  Smoke. What are the signs or symptoms? The main symptoms of this condition are pain and a feeling of pressure around the affected sinuses. Other symptoms include:  Stuffy nose or congestion.  Thick drainage from your nose.  Swelling and warmth over the affected sinuses.  Headache.  Upper toothache.  A cough that may get worse at night.  Extra mucus that collects in the throat or the back of the nose (postnasal drip).  Decreased sense  of smell and taste.  Fatigue.  A fever.  Sore throat.  Bad breath. How is this diagnosed? This condition is diagnosed based on:  Your symptoms.  Your medical history.  A physical exam.  Tests to find out if your condition is acute or chronic. This may include: ? Checking your nose for nasal polyps. ? Viewing your sinuses using a device that has a light (endoscope). ? Testing for allergies or bacteria. ? Imaging tests, such as an MRI or CT scan. In rare cases, a bone biopsy may be done to rule out more serious types of fungal sinus disease. How is this treated? Treatment for sinusitis depends on the cause  and whether your condition is chronic or acute.  If caused by a virus, your symptoms should go away on their own within 10 days. You may be given medicines to relieve symptoms. They include: ? Medicines that shrink swollen nasal passages (topical intranasal decongestants). ? Medicines that treat allergies (antihistamines). ? A spray that eases inflammation of the nostrils (topical intranasal corticosteroids). ? Rinses that help get rid of thick mucus in your nose (nasal saline washes).  If caused by bacteria, your health care provider may recommend waiting to see if your symptoms improve. Most bacterial infections will get better without antibiotic medicine. You may be given antibiotics if you have: ? A severe infection. ? A weak immune system.  If caused by narrow nasal passages or nasal polyps, you may need to have surgery. Follow these instructions at home: Medicines  Take, use, or apply over-the-counter and prescription medicines only as told by your health care provider. These may include nasal sprays.  If you were prescribed an antibiotic medicine, take it as told by your health care provider. Do not stop taking the antibiotic even if you start to feel better. Hydrate and humidify  Drink enough fluid to keep your urine pale yellow. Staying hydrated will help to  thin your mucus.  Use a cool mist humidifier to keep the humidity level in your home above 50%.  Inhale steam for 10-15 minutes, 3-4 times a day, or as told by your health care provider. You can do this in the bathroom while a hot shower is running.  Limit your exposure to cool or dry air.   Rest  Rest as much as possible.  Sleep with your head raised (elevated).  Make sure you get enough sleep each night. General instructions  Apply a warm, moist washcloth to your face 3-4 times a day or as told by your health care provider. This will help with discomfort.  Wash your hands often with soap and water to reduce your exposure to germs. If soap and water are not available, use hand sanitizer.  Do not smoke. Avoid being around people who are smoking (secondhand smoke).  Keep all follow-up visits as told by your health care provider. This is important.   Contact a health care provider if:  You have a fever.  Your symptoms get worse.  Your symptoms do not improve within 10 days. Get help right away if:  You have a severe headache.  You have persistent vomiting.  You have severe pain or swelling around your face or eyes.  You have vision problems.  You develop confusion.  Your neck is stiff.  You have trouble breathing. Summary  Sinusitis is soreness and inflammation of your sinuses. Sinuses are hollow spaces in the bones around your face.  This condition is caused by nasal tissues that become inflamed or swollen. The swelling traps or blocks the flow of mucus. This allows bacteria, viruses, and fungi to grow, which leads to infection.  If you were prescribed an antibiotic medicine, take it as told by your health care provider. Do not stop taking the antibiotic even if you start to feel better.  Keep all follow-up visits as told by your health care provider. This is important. This information is not intended to replace advice given to you by your health care provider.  Make sure you discuss any questions you have with your health care provider. Document Revised: 02/25/2018 Document Reviewed: 02/25/2018 Elsevier Patient Education  2021 [place]. Acute Bronchitis, Adult  Acute bronchitis is sudden or acute swelling of the air tubes (bronchi) in the lungs. Acute bronchitis causes these tubes to fill with mucus, which can make it hard to breathe. It can also cause coughing or wheezing. In adults, acute bronchitis usually goes away within 2 weeks. A cough caused by bronchitis may last up to 3 weeks. Smoking, allergies, and asthma can make the condition worse. What are the causes? This condition can be caused by germs and by substances that irritate the lungs, including:  Cold and flu viruses. The most common cause of this condition is the virus that causes the common cold.  Bacteria.  Substances that irritate the lungs, including: ? Smoke from cigarettes and other forms of tobacco. ? Dust and pollen. ? Fumes from chemical products, gases, or burned fuel. ? Other materials that pollute indoor or outdoor air.  Close contact with someone who has acute bronchitis. What increases the risk? The following factors may make you more likely to develop this condition:  A weak body's defense system, also called the immune system.  A condition that affects your lungs and breathing, such as asthma. What are the signs or symptoms? Common symptoms of this condition include:  Lung and breathing problems, such as: ? Coughing. This may bring up clear, yellow, or green mucus from your lungs (sputum). ? Wheezing. ? Having too much mucus in your lungs (chest congestion). ? Having shortness of breath.  A fever.  Chills.  Aches and pains, including: ? Tightness in your chest and other body aches. ? A sore throat. How is this diagnosed? This condition is usually diagnosed based on:  Your symptoms and medical history.  A physical exam. You may also have  other tests, including tests to rule out other conditions, such pneumonia. These tests include:  A test of lung function.  Test of a mucus sample to look for the presence of bacteria.  Tests to check the oxygen level in your blood.  Blood tests.  Chest X-ray. How is this treated? Most cases of acute bronchitis clear up over time without treatment. Your health care provider may recommend:  Drinking more fluids. This can thin your mucus, which may improve your breathing.  Taking a medicine for a fever or cough.  Using a device that gets medicine into your lungs (inhaler) to help improve breathing and control coughing.  Using a vaporizer or a humidifier. These are machines that add water to the air to help you breathe better. Follow these instructions at home: Activity  Get plenty of rest.  Return to your normal activities as told by your health care provider. Ask your health care provider what activities are safe for you. Lifestyle  Drink enough fluid to keep your urine pale yellow.  Do not drink alcohol.  Do not use any products that contain nicotine or tobacco, such as cigarettes, e-cigarettes, and chewing tobacco. If you need help quitting, ask your health care provider. Be aware that: ? Your bronchitis will get worse if you smoke or breathe in other people's smoke (secondhand smoke). ? Your lungs will heal faster if you quit smoking. General instructions  Take over-the-counter and prescription medicines only as told by your health care provider.  Use an inhaler, vaporizer, or humidifier as told by your health care provider.  If you have a sore throat, gargle with a salt-water mixture 3-4 times a day or as needed. To make a salt-water mixture, completely dissolve -1 tsp (3-6 g) of salt in 1  cup (237 mL) of warm water.  Keep all follow-up visits as told by your health care provider. This is important.   How is this prevented? To lower your risk of getting this condition  again:  Wash your hands often with soap and water. If soap and water are not available, use hand sanitizer.  Avoid contact with people who have cold symptoms.  Try not to touch your mouth, nose, or eyes with your hands.  Avoid places where there are fumes from chemicals. Breathing these fumes will make your condition worse.  Get the flu shot every year.   Contact a health care provider if:  Your symptoms do not improve after 2 weeks of treatment.  You vomit more than once or twice.  You have symptoms of dehydration such as: ? Dark urine. ? Dry skin or eyes. ? Increased thirst. ? Headaches. ? Confusion. ? Muscle cramps. Get help right away if you:  Cough up blood.  Feel pain in your chest.  Have severe shortness of breath.  Faint or keep feeling like you are going to faint.  Have a severe headache.  Have fever or chills that get worse. These symptoms may represent a serious problem that is an emergency. Do not wait to see if the symptoms will go away. Get medical help right away. Call your local emergency services (911 in the [place] Do not drive yourself to the hospital. Summary  Acute bronchitis is sudden (acute) inflammation of the air tubes (bronchi) between the windpipe and the lungs. In adults, acute bronchitis usually goes away within 2 weeks, although coughing may last 3 weeks or longer  Take over-the-counter and prescription medicines only as told by your health care provider.  Drink enough fluid to keep your urine pale yellow.  Contact a health care provider if your symptoms do not improve after 2 weeks of treatment.  Get help right away if you cough up blood, faint, or have chest pain or shortness of breath. This information is not intended to replace advice given to you by your health care provider. Make sure you discuss any questions you have with your health care provider. Document Revised: 06/09/2019 Document Reviewed: 04/18/2019 Elsevier Patient  Education  [place]. Albuterol Metered Dose Inhaler (MDI) What is this medicine? ALBUTEROL (al [place]) is a bronchodilator. It treats bronchospasm. Bronchospasm is when you have trouble breathing and make loud or whistling sounds when you breathe. This drug opens the airways in the lungs so it is easier to breathe. This medicine may be used for other purposes; ask your health care provider or pharmacist if you have questions. COMMON BRAND NAME(S): Proair HFA, Proventil, Proventil HFA, Respirol, Ventolin, Ventolin HFA What should I tell my health care provider before I take this medicine? They need to know if you have any of the following conditions:  diabetes (high blood sugar)  heart disease  high blood pressure  irregular heartbeat or rhythm  pheochromocytoma  seizures  thyroid disease  an unusual or allergic reaction to albuterol, levalbuterol, other medicines, foods, dyes, or preservatives  pregnant or trying to get pregnant  breast-feeding How should I use this medicine? This medicine is inhaled through the mouth. Take it as directed on the prescription label. Do not use it more often than directed. This medicine comes with INSTRUCTIONS FOR USE. Ask your pharmacist for directions on how to use this medicine. Read the information carefully. Talk to your pharmacist or health care provider if you have  questions. Talk to your health care provider about the use of this medicine in children. While it may be given to children for selected conditions, precautions do apply. Overdosage: If you think you have taken too much of this medicine contact a poison control center or emergency room at once. NOTE: This medicine is only for you. Do not share this medicine with others. What if I miss a dose? If you take this medication on a regular basis, take it as soon as you can. If it is almost time for your next dose, take only that dose. Do not take double or extra doses. What  may interact with this medicine?  anti-infectives like chloroquine and pentamidine  caffeine  cisapride  diuretics  medicines for colds  medicines for depression or for emotional or psychotic conditions  medicines for weight loss including some herbal products  methadone  some antibiotics like clarithromycin, erythromycin, levofloxacin, and linezolid  some heart medicines  steroid hormones like dexamethasone, cortisone, hydrocortisone  theophylline  thyroid hormones This list may not describe all possible interactions. Give your health care provider a list of all the medicines, herbs, non-prescription drugs, or dietary supplements you use. Also tell them if you smoke, drink alcohol, or use illegal drugs. Some items may interact with your medicine. What should I watch for while using this medicine? Visit your health care provider for regular checks on your progress. Tell your health care provider if your symptoms do not start to get better or if they get worse. If your symptoms get worse or if you are using this drug more than normal, call your health care provider right away. Do not treat yourself for coughs, colds or allergies without asking your health care provider for advice. Some nonprescription medicines can affect this one. You and your health care provider should develop an Asthma Action Plan that is just for you. Be sure to know what to do if you are in the yellow (asthma is getting worse) or red (medical alert) zones. Your mouth may get dry. Chewing sugarless gum or sucking hard candy and drinking plenty of water may help. Contact your health care provider if the problem does not go away or is severe. What side effects may I notice from receiving this medicine? Side effects that you should report to your doctor or health care professional as soon as possible:  allergic reactions (skin rash, itching or hives; swelling of the face, lips, or tongue)  fever  heartbeat  rhythm changes (trouble breathing; chest pain; dizziness; fast, irregular heartbeat; feeling faint or lightheaded, falls)  increase in blood pressure  muscle cramps, pain  muscle weakness  pain, tingling, numbness in the hands or feet  vomiting Side effects that usually do not require medical attention (report to your doctor or health care professional if they continue or are bothersome):  change in taste  cough  dry mouth  headache  nasal congestion (runny or stuffy nose)  sore throat  tremors  trouble sleeping  upset stomach This list may not describe all possible side effects. Call your doctor for medical advice about side effects. You may report side effects to FDA at 1-800-FDA-1088. Where should I keep my medicine? Keep out of the reach of children and pets. Store at room temperature between 20 and 25 degrees C (68 and 77 degrees F). Keep inhaler away from extreme heat and cold. Get rid of it when the dose counter reads 0 or after the expiration date, whichever is first. NOTE:  This sheet is a summary. It may not cover all possible information. If you have questions about this medicine, talk to your doctor, pharmacist, or health care provider.  2021 Elsevier/Gold Standard (2020-09-10 10:51:44)

## 2020-10-20 NOTE — Telephone Encounter (Signed)
Patient contacted via telephone and stated started prednisone this am still coughing.  Daughter wasn't able to bring meds over until after 6pm yesterday.   Started delsym OTC prn cough and still using albuterol MDI am, honey for cough and nasal saline but no flonase.  Patient reported cough productive tan yellow chunks.  Will have her start augmentin tonight.  Last albuterol use this am use 2 puffs now then continue q6h wa 2puffs tomorrow.  Just dosed honey and planning to take delsym prior to bed.  Discussed can use delsym and honey at same time.  Bloody nose this am.  Intermittent harsh cough, nasal congestion spoke full sentences during 4 minute telephone conversation.  Respirations even and unlabored.  No throat clearing or nasal sniffing noted.  Will follow up again tomorrow to see if improvement with augmentin and prednisone.  Patient verbalized understanding information/instructions, agreed with plan of care and had no further questions at this time.  HR notified not cleared to RTW yet.

## 2020-10-21 NOTE — Telephone Encounter (Signed)
Spoke with pt by phone. She reports starting Augmentin last night along with the prednisone. Increase in productive cough and moving/clearing mucus. Still using Albuterol inh, honey, Delsym, and nasal saline. Plan for out of work tomorrow again due to cough and fatigue. Plan for f/u over the weekend to determine return date.

## 2020-10-22 NOTE — Telephone Encounter (Signed)
Reviewed RN Hildred Alamin note still cough didn't RTW today will follow up with patient via telephone this weekend.

## 2020-10-23 NOTE — Telephone Encounter (Signed)
Patient returned call feeling better still a lot of mucous in the morning sinus and cough but then much better during the day.  Sinus drainage yellow now.  Sleeping well taking delsym dm prior to bed.  Still on prednisone taper oral and less fatigue but she doesn't know if that is due to prednisone side effect.  Plans to return to work 10/26/2020.  Mild intermittent cough and throat clearing noted during 3 minute telephone call. Spoke full sentences without difficulty.  A&Ox3 respirations even and unlabored.  HR notified of patient planned RTW date.  Patient to continue plan of care as previously discussed.  Patient verbalized understanding information/instructions, agreed with plan of care and had no further questions at this time.

## 2020-10-23 NOTE — Telephone Encounter (Signed)
Telephone message left for patient calling for symptoms update as noted cough worsened with last call from [employer].

## 2020-10-25 NOTE — Telephone Encounter (Signed)
No answer. LVMRCB. To confirm planned RTW tomorrow 1/18 and no worsening in sx.

## 2020-10-25 NOTE — Telephone Encounter (Signed)
Pt returned call. Reports she feels much better. Still planning on RTW tomorrow 1/18. No further needs or concerns.

## 2020-10-26 NOTE — Telephone Encounter (Signed)
Noted will follow up with patient onsite today.
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
## 2020-10-28 NOTE — Telephone Encounter (Signed)
Confirmed with HR that pt did RTW 10/27/20. Closing encounter.

## 2020-10-28 NOTE — Telephone Encounter (Signed)
Noted RTW as expected and no further questions or concerns.  Patient seen in lunchroom and feeling well.

## 2020-11-01 DIAGNOSIS — E119 Type 2 diabetes mellitus without complications: Secondary | ICD-10-CM

## 2020-11-01 MED ORDER — METFORMIN HCL ER 500 MG PO TB24: 500.0000 mg | ORAL_TABLET | Freq: Every day | ORAL | 0 refills | Status: DC

## 2020-11-01 NOTE — Telephone Encounter (Signed)
Patient left a voicemail stating that she is out of her Metformin and is not able to renew it until 11/12/20. Patient stated that she needs an updated script sent tot he pharmacy so that she can get it filled. Patient stated that she is out of the Metformin. Pharmacy [facility]

## 2020-12-01 ENCOUNTER — Ambulatory Visit (INDEPENDENT_AMBULATORY_CARE_PROVIDER_SITE_OTHER): Payer: PRIVATE HEALTH INSURANCE | Admitting: Primary Care

## 2020-12-01 ENCOUNTER — Other Ambulatory Visit: Payer: Self-pay

## 2020-12-01 VITALS — BP 110/80 | HR 87 | Temp 98.2°F | Ht 66.5 in | Wt 245.4 lb

## 2020-12-01 DIAGNOSIS — E119 Type 2 diabetes mellitus without complications: Secondary | ICD-10-CM | POA: Diagnosis not present

## 2020-12-01 LAB — POCT GLYCOSYLATED HEMOGLOBIN (HGB A1C): Hemoglobin A1C: 7.6 % — AB (ref 4.0–5.6)

## 2020-12-01 MED ORDER — METFORMIN HCL ER 500 MG PO TB24: 500.0000 mg | ORAL_TABLET | Freq: Every day | ORAL | 1 refills | Status: DC

## 2020-12-01 NOTE — Patient Instructions (Signed)
Continue taking Metformin XR 500 mg daily for diabetes.  Start exercising. You should be getting 150 minutes of moderate intensity exercise weekly.  It is important that you improve your diet. Please limit carbohydrates in the form of white bread, rice, pasta, sweets, fast food, fried food, sugary drinks, etc. Increase your consumption of fresh fruits and vegetables, whole grains, lean protein.  Ensure you are consuming 64 ounces of water daily.  Please schedule a follow up appointment in 3 months for your physical.   It was a pleasure to see you today!   Diabetes Mellitus and Nutrition, Adult When you have diabetes, or diabetes mellitus, it is very important to have healthy eating habits because your blood sugar (glucose) levels are greatly affected by what you eat and drink. Eating healthy foods in the right amounts, at about the same times every day, can help you:  Control your blood glucose.  Lower your risk of heart disease.  Improve your blood pressure.  Reach or maintain a healthy weight. What can affect my meal plan? Every person with diabetes is different, and each person has different needs for a meal plan. Your health care provider may recommend that you work with a dietitian to make a meal plan that is best for you. Your meal plan may vary depending on factors such as:  The calories you need.  The medicines you take.  Your weight.  Your blood glucose, blood pressure, and cholesterol levels.  Your activity level.  Other health conditions you have, such as heart or kidney disease. How do carbohydrates affect me? Carbohydrates, also called carbs, affect your blood glucose level more than any other type of food. Eating carbs naturally raises the amount of glucose in your blood. Carb counting is a method for keeping track of how many carbs you eat. Counting carbs is important to keep your blood glucose at a healthy level, especially if you use insulin or take certain oral  diabetes medicines. It is important to know how many carbs you can safely have in each meal. This is different for every person. Your dietitian can help you calculate how many carbs you should have at each meal and for each snack. How does alcohol affect me? Alcohol can cause a sudden decrease in blood glucose (hypoglycemia), especially if you use insulin or take certain oral diabetes medicines. Hypoglycemia can be a life-threatening condition. Symptoms of hypoglycemia, such as sleepiness, dizziness, and confusion, are similar to symptoms of having too much alcohol.  Do not drink alcohol if: ? Your health care provider tells you not to drink. ? You are pregnant, may be pregnant, or are planning to become pregnant.  If you drink alcohol: ? Do not drink on an empty stomach. ? Limit how much you use to:  0-1 drink a day for women.  0-2 drinks a day for men. ? Be aware of how much alcohol is in your drink. In the [place] one drink equals one 12 oz bottle of beer (355 mL), one 5 oz glass of wine (148 mL), or one 1 oz glass of hard liquor (44 mL). ? Keep yourself hydrated with water, diet soda, or unsweetened iced tea.  Keep in mind that regular soda, juice, and other mixers may contain a lot of sugar and must be counted as carbs. What are tips for following this plan? Reading food labels  Start by checking the serving size on the "Nutrition Facts" label of packaged foods and drinks. The amount of calories,  carbs, fats, and other nutrients listed on the label is based on one serving of the item. Many items contain more than one serving per package.  Check the total grams (g) of carbs in one serving. You can calculate the number of servings of carbs in one serving by dividing the total carbs by 15. For example, if a food has 30 g of total carbs per serving, it would be equal to 2 servings of carbs.  Check the number of grams (g) of saturated fats and trans fats in one serving. Choose foods that  have a low amount or none of these fats.  Check the number of milligrams (mg) of salt (sodium) in one serving. Most people should limit total sodium intake to less than 2,300 mg per day.  Always check the nutrition information of foods labeled as "low-fat" or "nonfat." These foods may be higher in added sugar or refined carbs and should be avoided.  Talk to your dietitian to identify your daily goals for nutrients listed on the label. Shopping  Avoid buying canned, pre-made, or processed foods. These foods tend to be high in fat, sodium, and added sugar.  Shop around the outside edge of the grocery store. This is where you will most often find fresh fruits and vegetables, bulk grains, fresh meats, and fresh dairy. Cooking  Use low-heat cooking methods, such as baking, instead of high-heat cooking methods like deep frying.  Cook using healthy oils, such as olive, canola, or sunflower oil.  Avoid cooking with butter, cream, or high-fat meats. Meal planning  Eat meals and snacks regularly, preferably at the same times every day. Avoid going long periods of time without eating.  Eat foods that are high in fiber, such as fresh fruits, vegetables, beans, and whole grains. Talk with your dietitian about how many servings of carbs you can eat at each meal.  Eat 4-6 oz (112-168 g) of lean protein each day, such as lean meat, chicken, fish, eggs, or tofu. One ounce (oz) of lean protein is equal to: ? 1 oz (28 g) of meat, chicken, or fish. ? 1 egg. ?  cup (62 g) of tofu.  Eat some foods each day that contain healthy fats, such as avocado, nuts, seeds, and fish.   What foods should I eat? Fruits Berries. Apples. Oranges. Peaches. Apricots. Plums. Grapes. Mango. Papaya. Pomegranate. Kiwi. Cherries. Vegetables Lettuce. Spinach. Leafy greens, including kale, chard, collard greens, and mustard greens. Beets. Cauliflower. Cabbage. Broccoli. Carrots. Green beans. Tomatoes. Peppers. Onions.  Cucumbers. Brussels sprouts. Grains Whole grains, such as whole-wheat or whole-grain bread, crackers, tortillas, cereal, and pasta. Unsweetened oatmeal. Quinoa. Brown or wild rice. Meats and other proteins Seafood. Poultry without skin. Lean cuts of poultry and beef. Tofu. Nuts. Seeds. Dairy Low-fat or fat-free dairy products such as milk, yogurt, and cheese. The items listed above may not be a complete list of foods and beverages you can eat. Contact a dietitian for more information. What foods should I avoid? Fruits Fruits canned with syrup. Vegetables Canned vegetables. Frozen vegetables with butter or cream sauce. Grains Refined white flour and flour products such as bread, pasta, snack foods, and cereals. Avoid all processed foods. Meats and other proteins Fatty cuts of meat. Poultry with skin. Breaded or fried meats. Processed meat. Avoid saturated fats. Dairy Full-fat yogurt, cheese, or milk. Beverages Sweetened drinks, such as soda or iced tea. The items listed above may not be a complete list of foods and beverages you should avoid. Contact  a dietitian for more information. Questions to ask a health care provider  Do I need to meet with a diabetes educator?  Do I need to meet with a dietitian?  What number can I call if I have questions?  When are the best times to check my blood glucose? Where to find more information:  American Diabetes Association: diabetes.org  Academy of Nutrition and Dietetics: www.eatright.[employer] of Diabetes and Digestive and Kidney Diseases: [employer]  Association of Diabetes Care and Education Specialists: www.diabeteseducator.org Summary  It is important to have healthy eating habits because your blood sugar (glucose) levels are greatly affected by what you eat and drink.  A healthy meal plan will help you control your blood glucose and maintain a healthy lifestyle.  Your health care provider may recommend that  you work with a dietitian to make a meal plan that is best for you.  Keep in mind that carbohydrates (carbs) and alcohol have immediate effects on your blood glucose levels. It is important to count carbs and to use alcohol carefully. This information is not intended to replace advice given to you by your health care provider. Make sure you discuss any questions you have with your health care provider. Document Revised: 09/02/2019 Document Reviewed: 09/02/2019 Elsevier Patient Education  2021 [employer].

## 2020-12-01 NOTE — Progress Notes (Signed)
Subjective:    Patient ID: Diana Lutz, female    DOB: 04-Jul-1966, 55 y.o.   MRN: [id number]  HPI  This visit occurred during the SARS-CoV-2 public health emergency.  Safety protocols were in place, including screening questions prior to the visit, additional usage of staff PPE, and extensive cleaning of exam room while observing appropriate contact time as indicated for disinfecting solutions.   Diana Lutz is a 55 year old female with a history of hypertension, OSA, type 2 diabetes who presents today for follow up of diabetes. Her son was recently diagnosed with diabetes.   Current medications include: Metformin XR 500 mg  She is checking her blood glucose 0 times daily.   Last A1C: 6.1 in September 2021 Last Eye Exam: She will schedule  Last Foot Exam: Due today Pneumonia Vaccination: 2021 ACE/ARB: Losartan Statin: Crestor   BP Readings from Last 3 Encounters:  12/01/20 110/80  06/29/20 128/80  06/15/20 132/82   She endorses a poor diet recently, admits to more starchy food and eating poorly during her Covid infection. She is back on track with her diet.   Review of Systems  Respiratory: Negative for shortness of breath.   Cardiovascular: Negative for chest pain.  Neurological: Negative for dizziness and numbness.       Past Medical History:  Diagnosis Date  . Abnormal Pap smear of cervix 1991  . Anemia    history of anemia  . Diabetes mellitus without complication ([place])   . Dysplasia of cervix    cervical ca  . GERD (gastroesophageal reflux disease)   . Hx of adenomatous polyp of colon 05/30/2020  . Hypertension   . Migraines      Social History   Socioeconomic History  . Marital status: Married    Spouse name: Not on file  . Number of children: Not on file  . Years of education: Not on file  . Highest education level: Not on file  Occupational History  . Not on file  Tobacco Use  . Smoking status: Never Smoker  . Smokeless tobacco: Never  Used  Vaping Use  . Vaping Use: Never used  Substance and Sexual Activity  . Alcohol use: Not Currently    Alcohol/week: 0.0 standard drinks    Comment: social  . Drug use: No  . Sexual activity: Not Currently  Other Topics Concern  . Not on file  Social History Narrative   Married.   3 children   Works for [employer] as Engineer, manufacturing systems.   Enjoys [place], working on the house   Social Determinants of Radio broadcast assistant Strain: Not on file  Food Insecurity: Not on file  Transportation Needs: Not on file  Physical Activity: Not on file  Stress: Not on file  Social Connections: Not on file  Intimate Partner Violence: Not on file    Past Surgical History:  Procedure Laterality Date  . ANTERIOR AND POSTERIOR REPAIR N/A 08/18/2015   Procedure: ANTERIOR (CYSTOCELE) AND POSTERIOR REPAIR (RECTOCELE)/Perineorrhaphy;  Surgeon: Azucena Fallen, MD;  Location: [facility];  Service: Gynecology;  Laterality: N/A;  2 hrs.  Marland Kitchen BLADDER SUSPENSION N/A 08/18/2015   Procedure: TRANSVAGINAL TAPE (TVT) Mid-Urethral Sling PROCEDURE;  Surgeon: Azucena Fallen, MD;  Location: [facility];  Service: Gynecology;  Laterality: N/A;  . COLONOSCOPY  11/15/2005   normal-Dr.Gessner  . CYSTOSCOPY N/A 08/18/2015   Procedure: CYSTOSCOPY;  Surgeon: Azucena Fallen, MD;  Location: [facility];  Service: Gynecology;  Laterality: N/A;  .  HYSTEROSCOPY W/ ENDOMETRIAL ABLATION    . LASER ABLATION OF THE CERVIX    . WISDOM TOOTH EXTRACTION      Family History  Problem Relation Age of Onset  . Hypertension Father   . Stroke Father   . Heart attack Father   . Prostate cancer Father   . Bladder Cancer Father   . Hypertension Mother   . Diverticulosis Mother   . Colon polyps Mother   . Hypertension Brother   . Diabetes Brother   . Colon cancer Maternal Grandmother 77  . Esophageal cancer Neg Hx   . Rectal cancer Neg Hx   . Stomach cancer Neg Hx     No Known Allergies  Current Outpatient Medications on File  Prior to Visit  Medication Sig Dispense Refill  . albuterol (VENTOLIN HFA) 108 (90 Base) MCG/ACT inhaler Inhale 1-2 puffs into the lungs every 6 (six) hours as needed for wheezing or shortness of breath. 8 g 0  . amLODipine (NORVASC) 10 MG tablet TAKE 1 TABLET BY MOUTH EVERY DAY FOR BLOOD PRESSURE 90 tablet 1  . calcium carbonate (TUMS - DOSED IN MG ELEMENTAL CALCIUM) 500 MG chewable tablet Chew 2 tablets by mouth daily as needed for indigestion or heartburn.    . [facility] LUBRICANT EYE DROPS 0.4-0.3 % SOLN APPLY 1 DROP TO EYE 3 (THREE) TIMES DAILY.  12  . fluticasone (FLONASE) 50 MCG/ACT nasal spray Place 1 spray into both nostrils 2 (two) times daily. 16 g 6  . ibuprofen (ADVIL,MOTRIN) 600 MG tablet Take 600 mg by mouth daily as needed.    Marland Kitchen losartan (COZAAR) 100 MG tablet Take 1 tablet (100 mg total) by mouth daily. For blood pressure. 90 tablet 3  . omeprazole (PRILOSEC) 20 MG capsule Take 1 capsule (20 mg total) by mouth daily. For heartburn. 90 capsule 1  . rosuvastatin (CRESTOR) 5 MG tablet Take 1 tablet (5 mg total) by mouth every evening. For cholesterol. 90 tablet 3  . sodium chloride (OCEAN) 0.65 % SOLN nasal spray Place 2 sprays into both nostrils every 2 (two) hours while awake.  0   No current facility-administered medications on file prior to visit.    BP 110/80   Pulse 87   Temp 98.2 F (36.8 C) (Temporal)   Ht 5' 6.5" (1.689 m)   Wt 245 lb 7 oz (111.3 kg)   SpO2 97%   BMI 39.02 kg/m    Objective:   Physical Exam Constitutional:      Appearance: She is well-nourished.  Cardiovascular:     Rate and Rhythm: Normal rate and regular rhythm.  Pulmonary:     Effort: Pulmonary effort is normal.     Breath sounds: Normal breath sounds.  Musculoskeletal:     Cervical back: Neck supple.  Skin:    General: Skin is warm and dry.  Psychiatric:        Mood and Affect: Mood and affect and mood normal.            Assessment & Plan:

## 2020-12-01 NOTE — Assessment & Plan Note (Signed)
Increase in A1C to 7.6, secondary to poor diet and lack of exercise.  She is motivated to work on diet and exercise. Continue Metformin XR 500 mg once daily.  Foot exam today. Pneumonia vaccine UTD. She will schedule eye exam.  Follow up in 3 months.

## 2021-02-14 DIAGNOSIS — I10 Essential (primary) hypertension: Secondary | ICD-10-CM

## 2021-02-14 DIAGNOSIS — E119 Type 2 diabetes mellitus without complications: Secondary | ICD-10-CM

## 2021-02-14 MED ORDER — AMLODIPINE BESYLATE 10 MG PO TABS: ORAL_TABLET | ORAL | 0 refills | Status: DC

## 2021-02-14 MED ORDER — LOSARTAN POTASSIUM 100 MG PO TABS
100.0000 mg | ORAL_TABLET | Freq: Every day | ORAL | 0 refills | Status: DC
Start: 2021-02-14 — End: 2021-09-03

## 2021-02-14 MED ORDER — METFORMIN HCL ER 500 MG PO TB24: 500.0000 mg | ORAL_TABLET | Freq: Every day | ORAL | 0 refills | Status: DC

## 2021-03-01 ENCOUNTER — Ambulatory Visit: Payer: PRIVATE HEALTH INSURANCE | Admitting: Primary Care

## 2021-03-01 ENCOUNTER — Other Ambulatory Visit: Payer: Self-pay

## 2021-03-01 VITALS — BP 110/70 | HR 73 | Temp 97.8°F | Ht 67.0 in | Wt 235.5 lb

## 2021-03-01 DIAGNOSIS — E119 Type 2 diabetes mellitus without complications: Secondary | ICD-10-CM

## 2021-03-01 DIAGNOSIS — I10 Essential (primary) hypertension: Secondary | ICD-10-CM | POA: Diagnosis not present

## 2021-03-01 DIAGNOSIS — K219 Gastro-esophageal reflux disease without esophagitis: Secondary | ICD-10-CM

## 2021-03-01 DIAGNOSIS — E559 Vitamin D deficiency, unspecified: Secondary | ICD-10-CM | POA: Diagnosis not present

## 2021-03-01 DIAGNOSIS — G4733 Obstructive sleep apnea (adult) (pediatric): Secondary | ICD-10-CM

## 2021-03-01 LAB — POCT GLYCOSYLATED HEMOGLOBIN (HGB A1C): Hemoglobin A1C: 6.1 % — AB (ref 4.0–5.6)

## 2021-03-01 NOTE — Assessment & Plan Note (Signed)
Doing well on omeprazole 20 mg, continue same. 

## 2021-03-01 NOTE — Assessment & Plan Note (Signed)
Repeat vitamin D level pending. She is not on supplementation.

## 2021-03-01 NOTE — Assessment & Plan Note (Signed)
Well controlled in the office today with A1C at 6.1. Commended her on lifestyle changes.  Continue metformin XR 500 mg. Managed on statin and ARB. Pneumonia vaccine UTD. She will schedule eye exam. Foot exam UTD.  She would like to repeat A1C in 3 months. Follow up in 6 months.

## 2021-03-01 NOTE — Assessment & Plan Note (Signed)
Well controlled in the office today, continue amlodipine 10 mg and losartan 100 mg.

## 2021-03-01 NOTE — Patient Instructions (Signed)
Stop by the lab prior to leaving today. I will notify you of your results once received.   Schedule a lab appointment for 3 months and then a follow up visit with me for 6 months for diabetes check.  It was a pleasure to see you today!

## 2021-03-01 NOTE — Assessment & Plan Note (Signed)
Compliant to CPAP machine most nights.

## 2021-03-01 NOTE — Progress Notes (Signed)
Subjective:    Patient ID: Diana Lutz, female    DOB: October 04, 1966, 55 y.o.   MRN: [id number]  HPI  Diana Lutz is a very pleasant 55 y.o. female with a history of hypertension, OSA, type 2 diabetes who presents today for follow up of chronic conditions.  1) Type 2 Diabetes:  Current medications include: Metformin XR 500 mg daily   She is checking her blood glucose 0 times daily.   Since her last visit she's been watching her diet, has been limiting portions.   Last A1C: 7.6 in February 2022, 6.1 today. Last Eye Exam: Due Last Foot Exam: UTD Pneumonia Vaccination: 2021 ACE/ARB: Losartan  Statin: Crestor   2) Essential Hypertension: Currently managed on losartan 100 mg, amlodipine 10 mg. She denies chest pain, dizziness, shortness of breath.   3) GERD: Currently managed omeprazole 20 mg daily for GERD, does well when taking, tried to wean off but could not tolerate.   BP Readings from Last 3 Encounters:  03/01/21 110/70  12/01/20 110/80  06/29/20 128/80   Wt Readings from Last 3 Encounters:  03/01/21 235 lb 8 oz (106.8 kg)  12/01/20 245 lb 7 oz (111.3 kg)  06/29/20 242 lb (109.8 kg)      Review of Systems  Eyes: Negative for visual disturbance.  Respiratory: Negative for shortness of breath.   Cardiovascular: Negative for chest pain.  Neurological: Negative for dizziness and numbness.         Past Medical History:  Diagnosis Date  . Abnormal Pap smear of cervix 1991  . Anemia    history of anemia  . Diabetes mellitus without complication ([place])   . Dysplasia of cervix    cervical ca  . GERD (gastroesophageal reflux disease)   . Hx of adenomatous polyp of colon 05/30/2020  . Hypertension   . Migraines     Social History   Socioeconomic History  . Marital status: Married    Spouse name: Not on file  . Number of children: Not on file  . Years of education: Not on file  . Highest education level: Not on file  Occupational History  .  Not on file  Tobacco Use  . Smoking status: Never Smoker  . Smokeless tobacco: Never Used  Vaping Use  . Vaping Use: Never used  Substance and Sexual Activity  . Alcohol use: Not Currently    Alcohol/week: 0.0 standard drinks    Comment: social  . Drug use: No  . Sexual activity: Not Currently  Other Topics Concern  . Not on file  Social History Narrative   Married.   3 children   Works for [employer] as Engineer, manufacturing systems.   Enjoys four wheeling, working on the house   Social Determinants of Radio broadcast assistant Strain: Not on file  Food Insecurity: Not on file  Transportation Needs: Not on file  Physical Activity: Not on file  Stress: Not on file  Social Connections: Not on file  Intimate Partner Violence: Not on file    Past Surgical History:  Procedure Laterality Date  . ANTERIOR AND POSTERIOR REPAIR N/A 08/18/2015   Procedure: ANTERIOR (CYSTOCELE) AND POSTERIOR REPAIR (RECTOCELE)/Perineorrhaphy;  Surgeon: Azucena Fallen, MD;  Location: [facility];  Service: Gynecology;  Laterality: N/A;  2 hrs.  Marland Kitchen BLADDER SUSPENSION N/A 08/18/2015   Procedure: TRANSVAGINAL TAPE (TVT) Mid-Urethral Sling PROCEDURE;  Surgeon: Azucena Fallen, MD;  Location: [facility];  Service: Gynecology;  Laterality: N/A;  . COLONOSCOPY  11/15/2005  normal-Dr.Gessner  . CYSTOSCOPY N/A 08/18/2015   Procedure: CYSTOSCOPY;  Surgeon: Azucena Fallen, MD;  Location: [facility];  Service: Gynecology;  Laterality: N/A;  . HYSTEROSCOPY W/ ENDOMETRIAL ABLATION    . LASER ABLATION OF THE CERVIX    . WISDOM TOOTH EXTRACTION      Family History  Problem Relation Age of Onset  . Hypertension Father   . Stroke Father   . Heart attack Father   . Prostate cancer Father   . Bladder Cancer Father   . Hypertension Mother   . Diverticulosis Mother   . Colon polyps Mother   . Hypertension Brother   . Diabetes Brother   . Colon cancer Maternal Grandmother 46  . Esophageal cancer Neg Hx   . Rectal cancer Neg Hx   .  Stomach cancer Neg Hx     No Known Allergies  Current Outpatient Medications on File Prior to Visit  Medication Sig Dispense Refill  . albuterol (VENTOLIN HFA) 108 (90 Base) MCG/ACT inhaler Inhale 1-2 puffs into the lungs every 6 (six) hours as needed for wheezing or shortness of breath. 8 g 0  . amLODipine (NORVASC) 10 MG tablet TAKE 1 TABLET BY MOUTH EVERY DAY FOR BLOOD PRESSURE 90 tablet 0  . calcium carbonate (TUMS - DOSED IN MG ELEMENTAL CALCIUM) 500 MG chewable tablet Chew 2 tablets by mouth daily as needed for indigestion or heartburn.    . [facility] LUBRICANT EYE DROPS 0.4-0.3 % SOLN APPLY 1 DROP TO EYE 3 (THREE) TIMES DAILY.  12  . fluticasone (FLONASE) 50 MCG/ACT nasal spray Place 1 spray into both nostrils 2 (two) times daily. 16 g 6  . ibuprofen (ADVIL,MOTRIN) 600 MG tablet Take 600 mg by mouth daily as needed.    Marland Kitchen losartan (COZAAR) 100 MG tablet Take 1 tablet (100 mg total) by mouth daily. For blood pressure. 90 tablet 0  . metFORMIN (GLUCOPHAGE XR) 500 MG 24 hr tablet Take 1 tablet (500 mg total) by mouth daily with breakfast. For diabetes. 90 tablet 0  . omeprazole (PRILOSEC) 20 MG capsule Take 1 capsule (20 mg total) by mouth daily. For heartburn. 90 capsule 1  . rosuvastatin (CRESTOR) 5 MG tablet Take 1 tablet (5 mg total) by mouth every evening. For cholesterol. 90 tablet 3  . sodium chloride (OCEAN) 0.65 % SOLN nasal spray Place 2 sprays into both nostrils every 2 (two) hours while awake.  0   No current facility-administered medications on file prior to visit.    BP 110/70   Pulse 73   Temp 97.8 F (36.6 C) (Temporal)   Ht 5\' 7"  (1.702 m)   Wt 235 lb 8 oz (106.8 kg)   SpO2 97%   BMI 36.88 kg/m  Objective:   Physical Exam Cardiovascular:     Rate and Rhythm: Normal rate and regular rhythm.  Pulmonary:     Effort: Pulmonary effort is normal.     Breath sounds: Normal breath sounds.  Musculoskeletal:     Cervical back: Neck supple.  Skin:    General: Skin is warm  and dry.           Assessment & Plan:      This visit occurred during the SARS-CoV-2 public health emergency.  Safety protocols were in place, including screening questions prior to the visit, additional usage of staff PPE, and extensive cleaning of exam room while observing appropriate contact time as indicated for disinfecting solutions.

## 2021-03-02 LAB — COMPREHENSIVE METABOLIC PANEL
ALT: 20 U/L (ref 0–35)
AST: 17 U/L (ref 0–37)
Albumin: 4.3 g/dL (ref 3.5–5.2)
Alkaline Phosphatase: 119 U/L — ABNORMAL HIGH (ref 39–117)
BUN: 19 mg/dL (ref 6–23)
CO2: 27 mEq/L (ref 19–32)
Calcium: 9.4 mg/dL (ref 8.4–10.5)
Chloride: 104 mEq/L (ref 96–112)
Creatinine, Ser: 0.79 mg/dL (ref 0.40–1.20)
GFR: 83.53 mL/min (ref >60.00)
Glucose, Bld: 113 mg/dL — ABNORMAL HIGH (ref 70–99)
Potassium: 4.5 mEq/L (ref 3.5–5.1)
Sodium: 139 mEq/L (ref 135–145)
Total Bilirubin: 0.7 mg/dL (ref 0.2–1.2)
Total Protein: 7 g/dL (ref 6.0–8.3)

## 2021-03-02 LAB — LIPID PANEL
Cholesterol: 201 mg/dL — ABNORMAL HIGH (ref 0–200)
HDL: 41.8 mg/dL (ref >39.00)
LDL Cholesterol: 126 mg/dL — ABNORMAL HIGH (ref 0–99)
NonHDL: 158.79
Total CHOL/HDL Ratio: 5
Triglycerides: 164 mg/dL — ABNORMAL HIGH (ref 0.0–149.0)
VLDL: 32.8 mg/dL (ref 0.0–40.0)

## 2021-03-02 LAB — CBC
HCT: 42.1 % (ref 36.0–46.0)
Hemoglobin: 14.2 g/dL (ref 12.0–15.0)
MCHC: 33.8 g/dL (ref 30.0–36.0)
MCV: 85.3 fl (ref 78.0–100.0)
Platelets: 193 10*3/uL (ref 150.0–400.0)
RBC: 4.93 Mil/uL (ref 3.87–5.11)
RDW: 14.2 % (ref 11.5–15.5)
WBC: 7 10*3/uL (ref 4.0–10.5)

## 2021-03-02 LAB — VITAMIN D 25 HYDROXY (VIT D DEFICIENCY, FRACTURES): VITD: 18.88 ng/mL — ABNORMAL LOW (ref 30.00–100.00)

## 2021-03-15 ENCOUNTER — Telehealth: Payer: Self-pay | Admitting: *Deleted

## 2021-03-15 ENCOUNTER — Encounter: Payer: Self-pay | Admitting: *Deleted

## 2021-03-15 DIAGNOSIS — Z20822 Contact with and (suspected) exposure to covid-19: Secondary | ICD-10-CM

## 2021-03-15 NOTE — Telephone Encounter (Signed)
Pt was notified by HR that she had an exposure to a now Covid positive coworker. Pt is not boosted so she was sent home by HR to complete 5 day quarantine with exposure testing.  RN spoke with pt by phone. She plans to use home test on Saturday. Pt is asymptomatic.   Day 0 6/6 Day 5 6/11 RTW 6/13 with strict mask use thru 6/16.

## 2021-03-15 NOTE — Telephone Encounter (Signed)
Reviewed RN Haley note agreed with plan of care. 

## 2021-03-17 NOTE — Telephone Encounter (Signed)
Reviewed RN Haley note agreed with plan of care. 

## 2021-03-17 NOTE — Telephone Encounter (Signed)
Spoke with pt by phone. She confirms still asymptomatic. Testing with home test 6/11. Plan for weekend f/u for results and clearance to RTW 6/13. Pt denies questions or concerns.

## 2021-03-19 NOTE — Telephone Encounter (Signed)
Patient contacted via telephone and feeling well.  Plans to do home covid test this evening.  Discussed will call her tomorrow to follow up for test results.  Patient verbalized understanding information/instructions, agreed with plan of care and had no further questions at this time.

## 2021-03-20 NOTE — Telephone Encounter (Signed)
Patient contacted via telephone stated covid test results negative still asymptomatic and feeling well.  A&Ox3 spoke full sentences without difficulty.  No nasal congestion/cough/throat clearing noted during 2 minute telephone call.  Cleared to return onsite with strict mask wear through 6/16 and no eating in employee lunchroom.  Patient verbalized understanding information/instructions, agreed with plan of care and had no further questions at this time  HR notified.

## 2021-03-21 NOTE — Telephone Encounter (Signed)
Spoke with pt by phone. She did RTW today 6/13 as expected. Feels well. Asymptomatic. Strict mask use thru 6/16. No further needs.

## 2021-03-21 NOTE — Telephone Encounter (Signed)
Noted patient feeling well and returned to work as expected; reviewed [employer] note and agreed with plan of care.

## 2021-03-29 NOTE — Telephone Encounter (Signed)
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  Gait sure and steady, skin warm dry and pink no cough/nasal congestion/throat clearing, respirations even and unlabored.

## 2021-05-16 ENCOUNTER — Other Ambulatory Visit: Payer: Self-pay | Admitting: Primary Care

## 2021-05-16 DIAGNOSIS — E559 Vitamin D deficiency, unspecified: Secondary | ICD-10-CM

## 2021-05-16 DIAGNOSIS — E119 Type 2 diabetes mellitus without complications: Secondary | ICD-10-CM

## 2021-05-17 ENCOUNTER — Ambulatory Visit: Payer: No Typology Code available for payment source | Admitting: Primary Care

## 2021-05-17 ENCOUNTER — Other Ambulatory Visit: Payer: Self-pay

## 2021-05-17 ENCOUNTER — Encounter: Payer: Self-pay | Admitting: Primary Care

## 2021-05-17 VITALS — BP 118/78 | HR 78 | Temp 97.7°F | Ht 67.0 in | Wt 221.0 lb

## 2021-05-17 DIAGNOSIS — M546 Pain in thoracic spine: Secondary | ICD-10-CM | POA: Insufficient documentation

## 2021-05-17 HISTORY — DX: Pain in thoracic spine: M54.6

## 2021-05-17 MED ORDER — CYCLOBENZAPRINE HCL 5 MG PO TABS: 5.0000 mg | ORAL_TABLET | Freq: Three times a day (TID) | ORAL | 0 refills | Status: DC | PRN

## 2021-05-17 NOTE — Assessment & Plan Note (Signed)
Acute for the last 3 weeks, no obvious cause. Exam and HPI today consistent for MSK involvement.  She has MSK tenderness and can provoke symptoms with movement.  Trial of low dose muscle relaxer, cyclobenzaprine 5 mg sent to pharmacy. Drowsiness precautions provided. Discussed to work on stretching. Continue Ibuprofen/Tylenol.

## 2021-05-17 NOTE — Patient Instructions (Signed)
You can take the cyclobenzaprine (Flexeril) 5 mg muscle relaxer up to three times daily as needed for muscle spasms. Start at bedtime as this can make you feel drowsy.  Try to stretch your muscles as discussed.   Continue Tylenol/Ibuprofen as needed.  It was a pleasure to see you today!

## 2021-05-17 NOTE — Progress Notes (Signed)
Subjective:    Patient ID: Diana Lutz, female    DOB: 07-31-1966, 55 y.o.   MRN: [id number]  HPI  Diana Lutz is a very pleasant 55 y.o. female with a history of hypertension, prediabetes, type 2 diabetes who presents today to discuss back pain.   Her pain is located to the mid/right thoracic back with radiation around the lateral and front right chest. Sometimes her pain will move straight through her chest wall.   Symptoms began 3 weeks ago. She denies injury/trauma, rash, numbness/tingling, cough. She describes her pain as "paralyzing, squeezing" that is worse with activity and movement. Pain is better in the morning when waking, worse at work. 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 Does a lot of bending and twisting. She can provoke her pain.  She saw the PA at her occupation, was told to use hot pads, Tylenol/Ibuprofen, BioFreeze which has not helped much. She is a non smoker.   BP Readings from Last 3 Encounters:  05/17/21 118/78  03/01/21 110/70  12/01/20 110/80    Wt Readings from Last 3 Encounters:  05/17/21 221 lb (100.2 kg)  03/01/21 235 lb 8 oz (106.8 kg)  12/01/20 245 lb 7 oz (111.3 kg)      Review of Systems  Musculoskeletal:  Positive for back pain and myalgias.  Skin:  Negative for rash.  Neurological:  Negative for numbness.        Past Medical History:  Diagnosis Date   Abnormal Pap smear of cervix 1991   Anemia    history of anemia   Diabetes mellitus without complication ([facility])    Dysplasia of cervix    cervical ca   GERD (gastroesophageal reflux disease)    Hx of adenomatous polyp of colon 05/30/2020   Hypertension    Migraines     Social History   Socioeconomic History   Marital status: Married    Spouse name: Not on file   Number of children: Not on file   Years of education: Not on file   Highest education level: Not on file  Occupational History   Not on file  Tobacco Use   Smoking status: Never    Smokeless tobacco: Never  Vaping Use   Vaping Use: Never used  Substance and Sexual Activity   Alcohol use: Not Currently    Alcohol/week: 0.0 standard drinks    Comment: social   Drug use: No   Sexual activity: Not Currently  Other Topics Concern   Not on file  Social History Narrative   Married.   3 children   Works for [employer] as Engineer, manufacturing systems.   Enjoys four wheeling, working on the house   Social Determinants of Health   Financial Resource Strain: Not on file  Food Insecurity: Not on file  Transportation Needs: Not on file  Physical Activity: Not on file  Stress: Not on file  Social Connections: Not on file  Intimate Partner Violence: Not on file    Past Surgical History:  Procedure Laterality Date   ANTERIOR AND POSTERIOR REPAIR N/A 08/18/2015   Procedure: ANTERIOR (CYSTOCELE) AND POSTERIOR REPAIR (RECTOCELE)/Perineorrhaphy;  Surgeon: Azucena Fallen, MD;  Location: [facility];  Service: Gynecology;  Laterality: N/A;  2 hrs.   BLADDER SUSPENSION N/A 08/18/2015   Procedure: TRANSVAGINAL TAPE (TVT) Mid-Urethral Sling PROCEDURE;  Surgeon: Azucena Fallen, MD;  Location: [facility];  Service: Gynecology;  Laterality: N/A;   COLONOSCOPY  11/15/2005   normal-Dr.Gessner   CYSTOSCOPY N/A 08/18/2015  Procedure: CYSTOSCOPY;  Surgeon: Azucena Fallen, MD;  Location: [facility];  Service: Gynecology;  Laterality: N/A;   HYSTEROSCOPY W/ ENDOMETRIAL ABLATION     LASER ABLATION OF THE CERVIX     WISDOM TOOTH EXTRACTION      Family History  Problem Relation Age of Onset   Hypertension Father    Stroke Father    Heart attack Father    Prostate cancer Father    Bladder Cancer Father    Hypertension Mother    Diverticulosis Mother    Colon polyps Mother    Hypertension Brother    Diabetes Brother    Colon cancer Maternal Grandmother 38   Esophageal cancer Neg Hx    Rectal cancer Neg Hx    Stomach cancer Neg Hx     No Known Allergies  Current Outpatient Medications on File Prior to  Visit  Medication Sig Dispense Refill   amLODipine (NORVASC) 10 MG tablet TAKE 1 TABLET BY MOUTH EVERY DAY FOR BLOOD PRESSURE 90 tablet 0   calcium carbonate (TUMS - DOSED IN MG ELEMENTAL CALCIUM) 500 MG chewable tablet Chew 2 tablets by mouth daily as needed for indigestion or heartburn.     [facility] LUBRICANT EYE DROPS 0.4-0.3 % SOLN APPLY 1 DROP TO EYE 3 (THREE) TIMES DAILY.  12   fluticasone (FLONASE) 50 MCG/ACT nasal spray Place 1 spray into both nostrils 2 (two) times daily. 16 g 6   ibuprofen (ADVIL,MOTRIN) 600 MG tablet Take 600 mg by mouth daily as needed.     losartan (COZAAR) 100 MG tablet Take 1 tablet (100 mg total) by mouth daily. For blood pressure. 90 tablet 0   metFORMIN (GLUCOPHAGE XR) 500 MG 24 hr tablet Take 1 tablet (500 mg total) by mouth daily with breakfast. For diabetes. 90 tablet 0   omeprazole (PRILOSEC) 20 MG capsule Take 1 capsule (20 mg total) by mouth daily. For heartburn. 90 capsule 1   rosuvastatin (CRESTOR) 5 MG tablet Take 1 tablet (5 mg total) by mouth every evening. For cholesterol. 90 tablet 3   sodium chloride (OCEAN) 0.65 % SOLN nasal spray Place 2 sprays into both nostrils every 2 (two) hours while awake.  0   No current facility-administered medications on file prior to visit.    BP 118/78   Pulse 78   Temp 97.7 F (36.5 C) (Temporal)   Ht '5\' 7"'$  (1.702 m)   Wt 221 lb (100.2 kg)   SpO2 97%   BMI 34.61 kg/m  Objective:   Physical Exam Pulmonary:     Effort: Pulmonary effort is normal.  Musculoskeletal:     Thoracic back: Tenderness present. No bony tenderness. Decreased range of motion.       Back:     Comments: Pain with twisting her to the right, bending forward.   Neurological:     Mental Status: She is alert.          Assessment & Plan:      This visit occurred during the SARS-CoV-2 public health emergency.  Safety protocols were in place, including screening questions prior to the visit, additional usage of staff PPE, and  extensive cleaning of exam room while observing appropriate contact time as indicated for disinfecting solutions.

## 2021-05-24 ENCOUNTER — Other Ambulatory Visit: Payer: Self-pay | Admitting: Primary Care

## 2021-05-24 DIAGNOSIS — I10 Essential (primary) hypertension: Secondary | ICD-10-CM

## 2021-05-25 NOTE — Telephone Encounter (Signed)
Up to date with office visit refill sent in

## 2021-05-26 ENCOUNTER — Encounter: Payer: Self-pay | Admitting: Registered Nurse

## 2021-05-26 ENCOUNTER — Telehealth: Payer: Self-pay | Admitting: Registered Nurse

## 2021-05-26 ENCOUNTER — Other Ambulatory Visit: Payer: Self-pay

## 2021-05-26 VITALS — BP 111/82 | HR 73 | Temp 98.2°F

## 2021-05-26 DIAGNOSIS — M62830 Muscle spasm of back: Secondary | ICD-10-CM

## 2021-05-26 DIAGNOSIS — R1010 Upper abdominal pain, unspecified: Secondary | ICD-10-CM

## 2021-05-26 DIAGNOSIS — F439 Reaction to severe stress, unspecified: Secondary | ICD-10-CM

## 2021-05-26 DIAGNOSIS — R109 Unspecified abdominal pain: Secondary | ICD-10-CM

## 2021-05-26 DIAGNOSIS — R634 Abnormal weight loss: Secondary | ICD-10-CM

## 2021-05-26 LAB — POCT URINALYSIS DIPSTICK
Bilirubin, UA: NEGATIVE
Blood, UA: NEGATIVE
Glucose, UA: NEGATIVE mg/dL
Ketones, POC UA: NEGATIVE mg/dL
Leukocytes, UA: NEGATIVE
Nitrite, UA: NEGATIVE
Protein, UA: NEGATIVE
Specific Gravity, UA: 1.03 (ref 1.005–1.030)
Urobilinogen, UA: 0.2 E.U./dL
pH, UA: 6 (ref 5.0–8.0)

## 2021-05-26 MED ORDER — BIOFREEZE 4 % EX GEL: 1.0000 "application " | Freq: Four times a day (QID) | CUTANEOUS | 0 refills | Status: AC | PRN

## 2021-05-26 NOTE — Progress Notes (Signed)
Subjective:    Patient ID: Diana Lutz, female    DOB: [birth year], 55 y.o.   MRN: [id number]  54y/o Caucasian established female pt c/o low back pain for several days. Also with separate pains that do not feel related to her, in middle of back and R mid back that feels achy. Pain in RUQ just under R breast feels constant and "grinding." Back and RUQ pain can occur together but not always.  Was seen by [facility] 05/17/21 and given flexeril.  Mid back pain has been ongoing 4 weeks.  Flexeril hasn't helped only makes her drowsy.  Extension of back/standing up worsens back pain when at work.  Denied loss of bowel/bladder control or extremity weakness.  Denied known injury.  Has tried heat helps some.  Has not tried epsom salt soak.  Alternating tylenol and motrin orally.  Reports possible history of kidney stone.  Has known back arthritis.   Also reports 38# wt loss over past 3 months that she attributes to stressful situation at home. States she does not have an appetite and makes herself eat. Denies change in bladder or bowel habits. Denied heartburn symptoms as long as she takes her omeprazole.  Had colonoscopy last year 1 polyp and  was told she has diverticulosis.  Denied diarrhea, constipation, bright red or black coffee ground stools or changes in frequency/consistency/color of stools  Last labs May 2022.  Denied urinary problems e.g. frequency/burning/hematuria.  Patient reported right upper belly near ribs sometimes feels a knot.  If acting up painful and she presses area makes it feel better.  Denied nausea/vomiting/fever/chills.  Noted on chart review patient with vaginal mesh and required 1 procedure for mesh erosion at [facility] 2016/2017 rectocele and cystocele.  No recent positive urine cultures.  Patient consented to video visit and entire visit completed via video.  Duration of video visit 18 minutes.  I spent 23 minutes dedicated to the care of this patient on the date of this encounter to  include pre-visit review of Epic encounters during the past year, results review, care everywhere, allergies, medical history, surgical history, family medical history, medications; face to face time with the patient, and post visit ordering of medications and writing patient instructions/attaching handouts to my chart account.   Patient location [facility] with RN Cheri Guppy; Provider location home     Review of Systems  Constitutional:  Positive for appetite change. Negative for activity change, chills, diaphoresis, fatigue and fever.  HENT:  Negative for trouble swallowing and voice change.   Eyes:  Negative for photophobia and visual disturbance.  Respiratory:  Negative for cough, shortness of breath, wheezing and stridor.   Cardiovascular:  Negative for chest pain.  Gastrointestinal:  Positive for abdominal pain. Negative for abdominal distention, anal bleeding, blood in stool, constipation, diarrhea, nausea and vomiting.  Endocrine: Negative for cold intolerance and heat intolerance.  Genitourinary:  Positive for flank pain. Negative for difficulty urinating, enuresis, frequency and hematuria.  Musculoskeletal:  Positive for back pain and myalgias. Negative for gait problem, neck pain and neck stiffness.  Skin:  Negative for color change and rash.  Allergic/Immunologic: Positive for environmental allergies. Negative for food allergies.  Neurological:  Negative for dizziness, tremors, seizures, syncope, facial asymmetry, speech difficulty, weakness, light-headedness, numbness and headaches.  Hematological:  Negative for adenopathy. Does not bruise/bleed easily.  Psychiatric/Behavioral:  Negative for agitation, confusion and sleep disturbance.       Objective:   Physical Exam Vitals and nursing note  reviewed.  Constitutional:      General: She is awake. She is not in acute distress.    Appearance: Normal appearance. She is well-developed and well-groomed. She is obese.  She is not ill-appearing, toxic-appearing or diaphoretic.  HENT:     Head: Normocephalic and atraumatic.     Jaw: There is normal jaw occlusion.     Salivary Glands: Right salivary gland is not diffusely enlarged. Left salivary gland is not diffusely enlarged.     Right Ear: Hearing and external ear normal.     Left Ear: Hearing and external ear normal.     Nose: Nose normal. No congestion or rhinorrhea.     Mouth/Throat:     Lips: Pink. No lesions.     Mouth: Mucous membranes are moist.     Pharynx: Oropharynx is clear.  Eyes:     General: Lids are normal. Vision grossly intact. Gaze aligned appropriately. No allergic shiner or scleral icterus.       Right eye: No discharge.        Left eye: No discharge.     Extraocular Movements: Extraocular movements intact.     Conjunctiva/sclera: Conjunctivae normal.     Pupils: Pupils are equal, round, and reactive to light.  Neck:     Trachea: Trachea and phonation normal. No tracheal deviation.  Cardiovascular:     Rate and Rhythm: Normal rate and regular rhythm.     Pulses: Normal pulses.          Radial pulses are 2+ on the right side and 2+ on the left side.  Pulmonary:     Effort: Pulmonary effort is normal. No respiratory distress.     Breath sounds: Normal breath sounds and air entry. No stridor or transmitted upper airway sounds. No wheezing.     Comments: Spoke full sentences without difficulty; no cough observed in exam room; patient wearing surgical mask in clinic Abdominal:     General: Abdomen is flat.     Palpations: Abdomen is soft.     Tenderness: There is no abdominal tenderness. There is no right CVA tenderness, left CVA tenderness, guarding or rebound. Negative signs include Murphy's sign.       Comments: RN Hildred Alamin assisted with physical exam as she was with patient in clinic  Musculoskeletal:        General: Tenderness present. No swelling.     Right shoulder: No swelling, deformity, effusion or laceration. Normal  range of motion. Normal strength.     Left shoulder: No swelling, deformity, effusion or laceration. Normal range of motion. Normal strength.     Right elbow: No swelling, deformity, effusion or lacerations. Normal range of motion.     Left elbow: No swelling, deformity, effusion or lacerations. Normal range of motion.     Right hand: No swelling, deformity or lacerations. Normal range of motion. Normal strength.     Left hand: No swelling, deformity or lacerations. Normal range of motion. Normal strength.       Arms:     Cervical back: Normal range of motion and neck supple. No swelling, edema, deformity, erythema, signs of trauma, lacerations, rigidity or crepitus. No pain with movement. Normal range of motion.     Thoracic back: Spasms and tenderness present. No swelling, edema, deformity, signs of trauma, lacerations or bony tenderness. Decreased range of motion. No scoliosis.     Lumbar back: No swelling, edema, deformity, signs of trauma, lacerations, spasms, tenderness or bony tenderness. Decreased range of  motion. No scoliosis.       Back:     Right lower leg: No swelling, deformity or lacerations. No edema.     Left lower leg: No swelling, deformity or lacerations.     Comments: Pain worsens with extension/lateral bending/rotation bilaterally; patient able to touch toes; normal heel/toe gait; in/out of chair without assistance  Lymphadenopathy:     Head:     Right side of head: No preauricular adenopathy.     Left side of head: No preauricular adenopathy.     Cervical:     Right cervical: No superficial cervical adenopathy.    Left cervical: No superficial cervical adenopathy.  Skin:    General: Skin is warm and dry.     Capillary Refill: Capillary refill takes less than 2 seconds.     Coloration: Skin is not ashen, cyanotic, jaundiced, mottled, pale or sallow.     Findings: No abrasion, abscess, acne, bruising, burn, ecchymosis, erythema, signs of injury, laceration, lesion,  petechiae, rash or wound.     Nails: There is no clubbing.  Neurological:     General: No focal deficit present.     Mental Status: She is alert and oriented to person, place, and time. Mental status is at baseline.     GCS: GCS eye subscore is 4. GCS verbal subscore is 5. GCS motor subscore is 6.     Cranial Nerves: Cranial nerves are intact. No cranial nerve deficit, dysarthria or facial asymmetry.     Sensory: Sensation is intact. No sensory deficit.     Motor: Motor function is intact. No weakness, tremor, atrophy, abnormal muscle tone or seizure activity.     Coordination: Coordination is intact. Coordination normal.     Gait: Gait is intact. Gait normal.     Comments: In/out of chair without difficulty; gait sure and steady in clinic; bilateral hand grasp equal 5/5  Psychiatric:        Attention and Perception: Attention and perception normal.        Mood and Affect: Mood and affect normal.        Speech: Speech normal.        Behavior: Behavior normal. Behavior is cooperative.        Thought Content: Thought content normal.        Cognition and Memory: Cognition and memory normal.        Judgment: Judgment normal.    Weights from epic 05/17/21 221 lb (100.2 kg)  03/01/21 235 lb 8 oz (106.8 kg)  12/01/20 245 lb 7 oz (111.3 kg)   Be Well 07/20/17 242 lbs 5' 6.5"  Results for YAN, DOTTS (MRN [id number]) as of 05/26/2021 13:49  Ref. Range 12/01/2020 15:15 03/01/2021 15:17 03/01/2021 15:17 03/01/2021 15:47  COMPREHENSIVE METABOLIC PANEL Unknown    Rpt (A)  Sodium Latest Ref Range: 135 - 145 mEq/L    139  Potassium Latest Ref Range: 3.5 - 5.1 mEq/L    4.5  Chloride Latest Ref Range: 96 - 112 mEq/L    104  CO2 Latest Ref Range: 19 - 32 mEq/L    27  Glucose Latest Ref Range: 70 - 99 mg/dL    113 (H)  BUN Latest Ref Range: 6 - 23 mg/dL    19  Creatinine Latest Ref Range: 0.40 - 1.20 mg/dL    0.79  Calcium Latest Ref Range: 8.4 - 10.5 mg/dL    9.4  Alkaline Phosphatase Latest  Ref Range: 39 - 117 U/L  119 (H)  Albumin Latest Ref Range: 3.5 - 5.2 g/dL    4.3  AST Latest Ref Range: 0 - 37 U/L    17  ALT Latest Ref Range: 0 - 35 U/L    20  Total Protein Latest Ref Range: 6.0 - 8.3 g/dL    7.0  Total Bilirubin Latest Ref Range: 0.2 - 1.2 mg/dL    0.7  GFR Latest Ref Range: >60.00 mL/min    83.53  Total CHOL/HDL Ratio Unknown    5  Cholesterol Latest Ref Range: 0 - 200 mg/dL    201 (H)  HDL Cholesterol Latest Ref Range: >39.00 mg/dL    41.80  LDL (calc) Latest Ref Range: 0 - 99 mg/dL    126 (H)  NonHDL Unknown    158.79  Triglycerides Latest Ref Range: 0.0 - 149.0 mg/dL    164.0 (H)  VLDL Latest Ref Range: 0.0 - 40.0 mg/dL    32.8  VITD Latest Ref Range: 30.00 - 100.00 ng/mL    18.88 (L)  WBC Latest Ref Range: 4.0 - 10.5 K/uL    7.0  RBC Latest Ref Range: 3.87 - 5.11 Mil/uL    4.93  Hemoglobin Latest Ref Range: 12.0 - 15.0 g/dL    14.2  HCT Latest Ref Range: 36.0 - 46.0 %    42.1  MCV Latest Ref Range: 78.0 - 100.0 fl    85.3  MCHC Latest Ref Range: 30.0 - 36.0 g/dL    33.8  RDW Latest Ref Range: 11.5 - 15.5 %    14.2  Platelets Latest Ref Range: 150.0 - 400.0 K/uL    193.0  Hemoglobin A1C Latest Ref Range: 4.0 - 5.6 % 7.6 (A) Pend 6.1 (A)    CLINICAL DATA:  Lumbar back pain with LEFT sciatica, no trauma   EXAM: LUMBAR SPINE - COMPLETE 4+ VIEW   COMPARISON:  None   FINDINGS: Five non-rib-bearing lumbar vertebra.   Minimal dextroconvex lumbar scoliosis.   Osseous mineralization grossly normal for technique.   Disc space narrowing L4-L5.   Facet degenerative changes L4-L5.   No acute fracture, dislocation or bone destruction.   Sclerotic focus identified at posterior aspect of the L5 vertebral body, uncertain etiology.   SI joints symmetric   IMPRESSION: Mild degenerate disc and facet disease changes at lower lumbar spine.   No acute fracture or subluxation.   Nonspecific sclerotic focus at the posterior aspect of the L5 vertebral body,  uncertain etiology ; if patient has persistent symptoms, recommend CT lumbar spine without contrast to further assess.     Electronically Signed   By: Lavonia Dana M.D.   On: 06/23/2016 12:45   [name], MD  05/30/2020 10:58 AM EDT Back to Top    1 diminutive adenoma Recall 2028  Colonoscopy pathology and summary - Two diminutive polyps in the descending colon and in the transverse colon, removed with a cold snare. Resected and retrieved. - Diverticulosis in the sigmoid colon and in the descending colon. - The examination was otherwise normal on direct and retroflexion views.  Dipstick urinalysis today WNL see results note.  Patient encouraged to hydrate as concentrated.  See my chart results note.    Assessment & Plan:  A-right flank pain, muscle spasms thoracic back subsequent visit, RUQ pain, unintentional weight loss, stress at home  P-urinalysis today dipstick and send for urine culture.  Notified by RN Hildred Alamin [facility] courier arrived 1350 and patient was unable to submit sample prior to  his arrival.  Will try again tomorrow.   Patient is to push noncaffeinated fluids Hydrate, avoid dehydration. Avoid holding urine void on frequent basis every 2 to 4 hours while awake. Discussed flank pain can be musculoskeletal or kidney related. Patient reported possible remote history of kidney stone. If unable to void every 8 hours, gross hematuria, tea/cola colored urine or repetitive vomiting follow up for re-evaluation with PCM, urgent care or ER. Call or return to clinic as needed if these symptoms worsen or fail to improve as anticipated. Exitcare handout on flank pain patient stated she will review in mychart.  Patient verbalized agreement and understanding of treatment plan and had no further questions at this time.   Cyclobenazeprine '5mg'$  po TID prn muscle spasms Rx'd from PCM at home. Consider biofreeze gel topical QID and epsom salt soak Avoid prolonged static/stationary positions e.g.  sitting/lying down as research shows typically worsens back pain.  May continue tylenol '1000mg'$  po q6h prn pain or Ibuprofen '800mg'$  po every 8 hours prn pain  Avoid alcohol intake and driving while taking cyclobenazeprine/flexeril as drowsiness common side effect.  Slow position changes as medication also lower blood pressure.  Home stretches recommended and avoiding prolonged static positions.  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  Discussed thoracic strain versus arthritis flare in addition to muscle spasms. Discussed ribs articulate with spine and that can sometimes feel like grinding if arthritis.  LFTs elevated last labs.  If no improvement in symptoms consider repeat LFTs and CBC. Self massage or professional prn, foam roller use or tennis/racquetball.  Heat/cryotherapy 15 minutes QID prn.  RN Hildred Alamin gave patient another stick on [employer] for use at work today.  Consider physical therapy referral if no improvement with prescribed therapy from [facility] and/or chiropractic care.  Ensure ergonomics correct desk at work avoid repetitive motions if possible/holding phone/laptop in hand use desk/stand and/or break up lifting items into smaller loads/weights.  Patient was instructed to rest, ice, and ROM exercises.  Activity as tolerated.   Follow up if symptoms persist or worsen especially if loss of bowel/bladder control, arm/leg weakness and/or saddle paresthesias.  Exitcare handout on muscle spasms and thoracic strain and rehab exercises.  Patient verbalized agreement and understanding of treatment plan and had no further questions at this time.  P2:  Injury Prevention and Fitness.   RUQ pain/unintentional weight loss 24 lbs since February/stressors at home/decreased appetite.  Discussed with stressors at home/motrin use could be ulcer stomach.  Recent colonoscopy 2 polyps diverticulosis. Discussed symptoms of diverticulitis with patient.  Discussed to notify clinic staff if new or worsening abdomen symptoms  especially blood/diarrhea/pain.  Discussed diverticulitis many times will need antibiotics. Encouraged regular meals with fruits and vegetables.  Discussed fiber helps to keep intestines cleaned out especially where pouches (diverticula) are located. Consider avoiding nuts/seeds.  Hydrate to keep urine pale yellow and clear and voiding every 2 to 4 hours/prevent dehydration. Take omeprazole every day.  Exitcare handout on diverticulitis, peptic ulcer.  Patient verbalized understanding information/instructions, agreed with plan of care and had no further questions at this time.  Patient given contact information for counselor under contract for employees at [employer] to schedule appt regarding home stressors.  Patient to follow up with PCM prn.  Patient verbalized understanding information/instructions, agreed with plan of care and had no further questions at this time.

## 2021-05-26 NOTE — Patient Instructions (Signed)
Diverticulitis  Diverticulitis is infection or inflammation of small pouches (diverticula) in the colon that form due to a condition called diverticulosis. Diverticula can trap stool (feces) and bacteria, causing infection and inflammation. Diverticulitis may cause severe stomach pain and diarrhea. It may lead to tissue damage in the colon that causes bleeding or blockage. The diverticula may also burst (rupture) and cause infected stool to enter other areas of the abdomen. What are the causes? This condition is caused by stool becoming trapped in the diverticula, which allows bacteria to grow in the diverticula. This leads to inflammation andinfection. What increases the risk? You are more likely to develop this condition if you have diverticulosis. The risk increases if you: Are overweight or obese. Do not get enough exercise. Drink alcohol. Use tobacco products. Eat a diet that has a lot of red meat such as beef, pork, or lamb. Eat a diet that does not include enough fiber. High-fiber foods include fruits, vegetables, beans, nuts, and whole grains. Are over 14 years of age. What are the signs or symptoms? Symptoms of this condition may include: Pain and tenderness in the abdomen. The pain is normally located on the left side of the abdomen, but it may occur in other areas. Fever and chills. Nausea. Vomiting. Cramping. Bloating. Changes in bowel routines. Blood in your stool. How is this diagnosed? This condition is diagnosed based on: Your medical history. A physical exam. Tests to make sure there is nothing else causing your condition. These tests may include: Blood tests. Urine tests. CT scan of the abdomen. How is this treated? Most cases of this condition are mild and can be treated at home. Treatment may include: Taking over-the-counter pain medicines. Following a clear liquid diet. Taking antibiotic medicines by mouth. Resting. More severe cases may need to be treated  at a hospital. Treatment may include: Not eating or drinking. Taking prescription pain medicine. Receiving antibiotic medicines through an IV. Receiving fluids and nutrition through an IV. Surgery. When your condition is under control, your health care provider may recommend that you have a colonoscopy. This is an exam to look at the entire large intestine. During the exam, a lubricated, bendable tube is inserted into the anus and then passed into the rectum, colon, and other parts of the large intestine. A colonoscopy can show how severe your diverticula are and whethersomething else may be causing your symptoms. Follow these instructions at home: Medicines Take over-the-counter and prescription medicines only as told by your health care provider. These include fiber supplements, probiotics, and stool softeners. If you were prescribed an antibiotic medicine, take it as told by your health care provider. Do not stop taking the antibiotic even if you start to feel better. Ask your health care provider if the medicine prescribed to you requires you to avoid driving or using machinery. Eating and drinking  Follow a full liquid diet or another diet as directed by your health care provider. After your symptoms improve, your health care provider may tell you to change your diet. He or she may recommend that you eat a diet that contains at least 25 grams (25 g) of fiber daily. Fiber makes it easier to pass stool. Healthy sources of fiber include: Berries. One cup contains 4-8 grams of fiber. Beans or lentils. One-half cup contains 5-8 grams of fiber. Green vegetables. One cup contains 4 grams of fiber. Avoid eating red meat.  General instructions Do not use any products that contain nicotine or tobacco, such as cigarettes,  e-cigarettes, and chewing tobacco. If you need help quitting, ask your health care provider. Exercise for at least 30 minutes, 3 times each week. You should exercise hard enough to  raise your heart rate and break a sweat. Keep all follow-up visits as told by your health care provider. This is important. You may need to have a colonoscopy. Contact a health care provider if: Your pain does not improve. Your bowel movements do not return to normal. Get help right away if: Your pain gets worse. Your symptoms do not get better with treatment. Your symptoms suddenly get worse. You have a fever. You vomit more than one time. You have stools that are bloody, black, or tarry. Summary Diverticulitis is infection or inflammation of small pouches (diverticula) in the colon that form due to a condition called diverticulosis. Diverticula can trap stool (feces) and bacteria, causing infection and inflammation. You are at higher risk for this condition if you have diverticulosis and you eat a diet that does not include enough fiber. Most cases of this condition are mild and can be treated at home. More severe cases may need to be treated at a hospital. When your condition is under control, your health care provider may recommend that you have an exam called a colonoscopy. This exam can show how severe your diverticula are and whether something else may be causing your symptoms. Keep all follow-up visits as told by your health care provider. This is important. This information is not intended to replace advice given to you by your health care provider. Make sure you discuss any questions you have with your healthcare provider. Document Revised: 07/07/2019 Document Reviewed: 07/07/2019 Elsevier Patient Education  2022 [place]. Peptic Ulcer  A peptic ulcer is a painful sore in the lining of your stomach or the firstpart of your small intestine. What are the causes? Common causes of this condition include: An infection. Using certain pain medicines too often or too much. What increases the risk? You are more likely to get this condition if you: Smoke. Have a family history of  ulcer disease. Drink alcohol. Have been hospitalized in an intensive care unit (ICU). What are the signs or symptoms? Symptoms include: Burning pain in the area between the chest and the belly button. The pain may: Not go away (be persistent). Be worse when your stomach is empty. Be worse at night. Heartburn. Feeling sick to your stomach (nauseous) and throwing up (vomiting). Bloating. If the ulcer results in bleeding, it can cause you to: Have poop (stool) that is black and looks like tar. Throw up bright red blood. Throw up material that looks like coffee grounds. How is this treated? Treatment for this condition may include: Stopping things that can cause the ulcer, such as: Smoking. Using pain medicines. Medicines to reduce stomach acid. Antibiotic medicines if the ulcer is caused by an infection. A procedure that is done using a small, flexible tube that has a camera at the end (upper endoscopy). This may be done if you have a bleeding ulcer. Surgery. This may be needed if: You have a lot of bleeding. The ulcer caused a hole somewhere in the digestive system. Follow these instructions at home: Do not drink alcohol if your doctor tells you not to drink. Limit how much caffeine you take in. Do not use any products that contain nicotine or tobacco, such as cigarettes, e-cigarettes, and chewing tobacco. If you need help quitting, ask your doctor. Take over-the-counter and prescription medicines only as  told by your doctor. Do not stop or change your medicines unless you talk with your doctor about it first. Do not take aspirin, ibuprofen, or other NSAIDs unless your doctor told you to do so. Keep all follow-up visits as told by your doctor. This is important. Contact a doctor if: You do not get better in 7 days after you start treatment. You keep having an upset stomach (indigestion) or heartburn. Get help right away if: You have sudden, sharp pain in your belly  (abdomen). You have belly pain that does not go away. You have bloody poop (stool) or black, tarry poop. You throw up blood. It may look like coffee grounds. You feel light-headed or feel like you may pass out (faint). You get weak. You get sweaty or feel sticky and cold to the touch (clammy). Summary Symptoms of a peptic ulcer include burning pain in the area between the chest and the belly button. Take medicines only as told by your doctor. Limit how much alcohol and caffeine you have. Keep all follow-up visits as told by your doctor. This information is not intended to replace advice given to you by your health care provider. Make sure you discuss any questions you have with your healthcare provider. Document Revised: 04/02/2018 Document Reviewed: 04/02/2018 Elsevier Patient Education  2022 [place]. Flank Pain, Adult Flank pain is pain that is located on the side of the body between the upper abdomen and the back. This area is called the flank. The pain may occur over a short period of time (acute), or it may be long-term or recurring (chronic). It may be mild or severe. Flank pain can be caused by many things, including: Muscle soreness or injury. Kidney stones or kidney disease. Stress. A disease of the spine (vertebral disk disease). A lung infection (pneumonia). Fluid around the lungs (pulmonary edema). A skin rash caused by the chickenpox virus (shingles). Tumors that affect the back of the abdomen. Gallbladder disease. Follow these instructions at home:  Drink enough fluid to keep your urine clear or pale yellow. Rest as told by your health care provider. Take over-the-counter and prescription medicines only as told by your health care provider. Keep a journal to track what has caused your flank pain and what has made it feel better. Keep all follow-up visits as told by your health care provider. This is important. Contact a health care provider if: Your pain is not  controlled with medicine. You have new symptoms. Your pain gets worse. You have a fever. Your symptoms last longer than 2-3 days. You have trouble urinating or you are urinating very frequently. Get help right away if: You have trouble breathing or you are short of breath. Your abdomen hurts or it is swollen or red. You have nausea or vomiting. You feel faint or you pass out. You have blood in your urine. Summary Flank pain is pain that is located on the side of the body between the upper abdomen and the back. The pain may occur over a short period of time (acute), or it may be long-term or recurring (chronic). It may be mild or severe. Flank pain can be caused by many things. Contact your health care provider if your symptoms get worse or they last longer than 2-3 days. This information is not intended to replace advice given to you by your health care provider. Make sure you discuss any questions you have with your healthcare provider. Document Revised: 06/15/2020 Document Reviewed: 06/18/2020 Elsevier Patient Education  [place]. Abdominal Pain, Adult Pain in the abdomen (abdominal pain) can be caused by many things. Often, abdominal pain is not serious and it gets better with no treatment or by being treated at home. However, sometimesabdominal pain is serious. Your health care provider will ask questions about your medical history and doa physical exam to try to determine the cause of your abdominal pain. Follow these instructions at home: Medicines Take over-the-counter and prescription medicines only as told by your health care provider. Do not take a laxative unless told by your health care provider. General instructions  Watch your condition for any changes. Drink enough fluid to keep your urine pale yellow. Keep all follow-up visits as told by your health care provider. This is important.  Contact a health care provider if: Your abdominal pain changes or gets  worse. You are not hungry or you lose weight without trying. You are constipated or have diarrhea for more than 2-3 days. You have pain when you urinate or have a bowel movement. Your abdominal pain wakes you up at night. Your pain gets worse with meals, after eating, or with certain foods. You are vomiting and cannot keep anything down. You have a fever. You have blood in your urine. Get help right away if: Your pain does not go away as soon as your health care provider told you to expect. You cannot stop vomiting. Your pain is only in areas of the abdomen, such as the right side or the left lower portion of the abdomen. Pain on the right side could be caused by appendicitis. You have bloody or black stools, or stools that look like tar. You have severe pain, cramping, or bloating in your abdomen. You have signs of dehydration, such as: Dark urine, very little urine, or no urine. Cracked lips. Dry mouth. Sunken eyes. Sleepiness. Weakness. You have trouble breathing or chest pain. Summary Often, abdominal pain is not serious and it gets better with no treatment or by being treated at home. However, sometimes abdominal pain is serious. Watch your condition for any changes. Take over-the-counter and prescription medicines only as told by your health care provider. Contact a health care provider if your abdominal pain changes or gets worse. Get help right away if you have severe pain, cramping, or bloating in your abdomen. This information is not intended to replace advice given to you by your health care provider. Make sure you discuss any questions you have with your healthcare provider. Document Revised: 11/14/2019 Document Reviewed: 02/03/2019 Elsevier Patient Education  2022 [place] Thoracic Strain Rehab Ask your health care provider which exercises are safe for you. Do exercises exactly as told by your health care provider and adjust them as directed. It is normal to feel  mild stretching, pulling, tightness, or discomfort as you do these exercises. Stop right away if you feel sudden pain or your pain gets worse. Do not begin these exercises until told by your health care provider. Stretching and range-of-motion exercise This exercise warms up your muscles and joints and improves the movement and flexibility of your back and shoulders. This exercise also helps to relievepain. Chest and spine stretch  Lie down on your back on a firm surface. Roll a towel or a small blanket so it is about 4 inches (10 cm) in diameter. Put the towel lengthwise under the middle of your back so it is under your spine, but not under your shoulder blades. Put your hands behind your head and let  your elbows fall to your sides. This will increase your stretch. Take a deep breath (inhale). Hold for __________ seconds. Relax after you breathe out (exhale). Repeat __________ times. Complete this exercise __________ times a day. Strengthening exercises These exercises build strength and endurance in your back and your shoulder blade muscles. Endurance is the ability to use your muscles for a long time,even after they get tired. Alternating arm and leg raises  Get on your hands and knees on a firm surface. If you are on a hard floor, you may want to use padding, such as an exercise mat, to cushion your knees. Line up your arms and legs. Your hands should be directly below your shoulders, and your knees should be directly below your hips. Lift your left leg behind you. At the same time, raise your right arm and straighten it in front of you. Do not lift your leg higher than your hip. Do not lift your arm higher than your shoulder. Keep your abdominal and back muscles tight. Keep your hips facing the ground. Do not arch your back. Keep your balance carefully, and do not hold your breath. Hold for __________ seconds. Slowly return to the starting position and repeat with your right leg and  your left arm. Repeat __________ times. Complete this exercise __________ times a day. Straight arm rows This exercise is also called shoulder extension exercise. Stand with your feet shoulder width apart. Secure an exercise band to a stable object in front of you so the band is at or above shoulder height. Hold one end of the exercise band in each hand. Straighten your elbows and lift your hands up to shoulder height. Step back, away from the secured end of the exercise band, until the band stretches. Squeeze your shoulder blades together and pull your hands down to the sides of your thighs. Stop when your hands are straight down by your sides. This is shoulder extension. Do not let your hands go behind your body. Hold for __________ seconds. Slowly return to the starting position. Repeat __________ times. Complete this exercise __________ times a day. Prone shoulder external rotation Lie on your abdomen on a firm bed so your left / right forearm hangs over the edge of the bed and your upper arm is on the bed, straight out from your body. This is the prone position. Your elbow should be bent. Your palm should be facing your feet. If instructed, hold a __________ weight in your hand. Squeeze your shoulder blade toward the middle of your back. Do not let your shoulder lift toward your ear. Keep your elbow bent in a 90-degree angle (right angle) while you slowly move your forearm up toward the ceiling. Move your forearm up to the height of the bed, toward your head. This is external rotation. Your upper arm should not move. At the top of the movement, your palm should face the floor. Hold for __________ seconds. Slowly return to the starting position and relax your muscles. Repeat __________ times. Complete this exercise __________ times a day. Rowing scapular retraction This is an exercise in which the shoulder blades (scapulae) are pulled toward each other (retraction). Sit in a stable  chair without armrests, or stand up. Secure an exercise band to a stable object in front of you so the band is at shoulder height. Hold one end of the exercise band in each hand. Your palms should face down. Bring your arms out straight in front of you. Step back, away from  the secured end of the exercise band, until the band stretches. Pull the band backward. As you do this, bend your elbows and squeeze your shoulder blades together, but avoid letting the rest of your body move. Do not shrug your shoulders upward while you do this. Stop when your elbows are at your sides or slightly behind your body. Hold for __________ seconds. Slowly straighten your arms to return to the starting position. Repeat __________ times. Complete this exercise __________ times a day. Posture and body mechanics Good posture and healthy body mechanics can help to relieve stress in your body's tissues and joints. Body mechanics refers to the movements and positions of your body while you do your daily activities. Posture is part of body mechanics. Good posture means: Your spine is in its natural S-curve position (neutral). Your shoulders are pulled back slightly. Your head is not tipped forward. Follow these guidelines to improve your posture and body mechanics in youreveryday activities. Standing  When standing, keep your spine neutral and your feet about hip width apart. Keep a slight bend in your knees. Your ears, shoulders, and hips should line up with each other. When you do a task in which you lean forward while standing in one place for a long time, place one foot up on a stable object that is 2-4 inches (5-10 cm) high, such as a footstool. This helps keep your spine neutral.  Sitting  When sitting, keep your spine neutral and keep your feet flat on the floor. Use a footrest, if necessary, and keep your thighs parallel to the floor. Avoid rounding your shoulders, and avoid tilting your head forward. When  working at a desk or a computer, keep your desk at a height where your hands are slightly lower than your elbows. Slide your chair under your desk so you are close enough to maintain good posture. When working at a computer, place your monitor at a height where you are looking straight ahead and you do not have to tilt your head forward or downward to look at the screen.  Resting When lying down and resting, avoid positions that are most painful for you. If you have pain with activities such as sitting, bending, stooping, or squatting (flexion-basedactivities), lie in a position in which your body does not bend very much. For example, avoid curling up on your side with your arms and knees near your chest (fetal position). If you have pain with activities such as standing for a long time or reaching with your arms (extension-basedactivities), lie with your spine in a neutral position and bend your knees slightly. Try the following positions: Lie on your side with a pillow between your knees. Lie on your back with a pillow under your knees.  Lifting  When lifting objects, keep your feet at least shoulder width apart and tighten your abdominal muscles. Bend your knees and hips and keep your spine neutral. It is important to lift using the strength of your legs, not your back. Do not lock your knees straight out. Always ask for help to lift heavy or awkward objects.  This information is not intended to replace advice given to you by your health care provider. Make sure you discuss any questions you have with your healthcare provider. Document Revised: 01/17/2019 Document Reviewed: 11/04/2018 Elsevier Patient Education  [place]. Thoracic Strain A thoracic strain, which is sometimes called a mid-back strain, is an injury to the muscles or tendons that attach to the upper part of  your back behind yourchest. This type of injury occurs when a muscle is overstretched or overloaded. Thoracic  strains can range from mild to severe. Mild strains may involve stretching a muscle or tendon without tearing it. These injuries may heal in 1-2 weeks. More severe strains involve tearing of muscle fibers or tendons.These will cause more pain and may take 6-8 weeks to heal. What are the causes? This condition may be caused by: Trauma, such as a fall or a hit to the body. Twisting or overstretching the back. This may result from doing activities that require a lot of energy, such as lifting heavy objects. In some cases, the cause may not be known. What increases the risk? This injury is more common in: Athletes. People with obesity. What are the signs or symptoms? The main symptom of this condition is pain in the middle back, especially with movement. Other symptoms include: Stiffness or limited range of motion. Sudden muscle tightening (spasms). How is this diagnosed? This condition may be diagnosed based on: Your symptoms. Your medical history. A physical exam. Imaging tests, such as X-rays or an MRI. How is this treated? This condition may be treated with: Resting the injured area. Applying heat and cold to the injured area. Over-the-counter medicines for pain and inflammation, such as NSAIDs. Prescription pain medicine or muscle relaxants may be needed for a short time. Physical therapy. This will involve doing stretching and strengthening exercises. Follow these instructions at home: Managing pain, stiffness, and swelling     If directed, put ice on the injured area. Put ice in a plastic bag. Place a towel between your skin and the bag. Leave the ice on for 20 minutes, 2-3 times a day. If directed, apply heat to the affected area as often as told by your health care provider. Use the heat source that your health care provider recommends, such as a moist heat pack or a heating pad. Place a towel between your skin and the heat source. Leave the heat on for 20-30  minutes. Remove the heat if your skin turns bright red. This is especially important if you are unable to feel pain, heat, or cold. You may have a greater risk of getting burned. Activity Rest and return to your normal activities as told by your health care provider. Ask your health care provider what activities are safe for you. Do exercises as told by your health care provider. Medicines Take over-the-counter and prescription medicines only as told by your health care provider. Ask your health care provider if the medicine prescribed to you: Requires you to avoid driving or using heavy machinery. Can cause constipation. You may need to take these actions to prevent or treat constipation: Drink enough fluid to keep your urine pale yellow. Take over-the-counter or prescription medicines. Eat foods that are high in fiber, such as beans, whole grains, and fresh fruits and vegetables. Limit foods that are high in fat and processed sugars, such as fried or sweet foods. Injury prevention To prevent a future mid-back injury: Always warm up properly before physical activity or sports. Cool down and stretch after being active. Use correct form when playing sports and lifting heavy objects. Bend your knees before you lift heavy objects. Use good posture when sitting and standing. Stay physically fit and maintain a healthy weight. Do at least 150 minutes of moderate-intensity exercise each week, such as brisk walking or water aerobics. Do strength exercises at least 2 times each week.  General instructions Do not  use any products that contain nicotine or tobacco, such as cigarettes, e-cigarettes, and chewing tobacco. If you need help quitting, ask your health care provider. Keep all follow-up visits as told by your health care provider. This is important. Contact a health care provider if: Your pain is not helped by medicine. Your pain or stiffness is getting worse. You develop pain or stiffness  in your neck or lower back. Get help right away if you: Have shortness of breath. Have chest pain. Develop numbness or weakness in your legs or arms. Have involuntary loss of urine (urinary incontinence). Summary A thoracic strain, which is sometimes called a mid-back strain, is an injury to the muscles or tendons that attach to the upper part of your back behind your chest. This type of injury occurs when a muscle is overstretched or overloaded. Rest and return to your normal activities as told by your health care provider. If directed, apply heat or ice to the affected area as often as told by your health care provider. Take over-the-counter and prescription medicines only as told by your health care provider. Contact a health care provider if you have new or worsening symptoms. This information is not intended to replace advice given to you by your health care provider. Make sure you discuss any questions you have with your healthcare provider. Document Revised: 08/13/2018 Document Reviewed: 08/13/2018 Elsevier Patient Education  2022 [place]. Muscle Cramps and Spasms Muscle cramps and spasms occur when a muscle or muscles tighten and you have no control over this tightening (involuntary muscle contraction). They are a common problem and can develop in any muscle. The most common place is in the calf muscles of the leg. Muscle cramps and muscle spasms are both involuntary muscle contractions, but there are some differences between the two: Muscle cramps are painful. They come and go and may last for a few seconds or up to 15 minutes. Muscle cramps are often more forceful and last longer than muscle spasms. Muscle spasms may or may not be painful. They may also last just a few seconds or much longer. Certain medical conditions, such as diabetes or Parkinson's disease, can make it more likely to develop cramps or spasms. However, cramps or spasms are usually not caused by a serious  underlying problem. Common causes include: Doing more physical work or exercise than your body is ready for (overexertion). Overuse from repeating certain movements too many times. Remaining in a certain position for a long period of time. Improper preparation, form, or technique while playing a sport or doing an activity. Dehydration. Injury. Side effects of some medicines. Abnormally low levels of the salts and minerals in your blood (electrolytes), especially potassium and calcium. This could happen if you are taking water pills (diuretics) or if you are pregnant. In many cases, the cause of muscle cramps or spasms is not known. Follow these instructions at home: Managing pain and stiffness     Try massaging, stretching, and relaxing the affected muscle. Do this for several minutes at a time. If directed, apply heat to tight or tense muscles as often as told by your health care provider. Use the heat source that your health care provider recommends, such as a moist heat pack or a heating pad. Place a towel between your skin and the heat source. Leave the heat on for 20-30 minutes. Remove the heat if your skin turns bright red. This is especially important if you are unable to feel pain, heat, or cold. You  may have a greater risk of getting burned. If directed, put ice on the affected area. This may help if you are sore or have pain after a cramp or spasm. Put ice in a plastic bag. Place a towel between your skin and the bag. Leave the ice on for 20 minutes, 2-3 times a day. Try taking hot showers or baths to help relax tight muscles. Eating and drinking Drink enough fluid to keep your urine pale yellow. Staying well hydrated may help prevent cramps or spasms. Eat a healthy diet that includes plenty of nutrients to help your muscles function. A healthy diet includes fruits and vegetables, lean protein, whole grains, and low-fat or nonfat dairy products. General instructions If you are  having frequent cramps, avoid intense exercise for several days. Take over-the-counter and prescription medicines only as told by your health care provider. Pay attention to any changes in your symptoms. Keep all follow-up visits as told by your health care provider. This is important. Contact a health care provider if: Your cramps or spasms get more severe or happen more often. Your cramps or spasms do not improve over time. Summary Muscle cramps and spasms occur when a muscle or muscles tighten and you have no control over this tightening (involuntary muscle contraction). The most common place for cramps or spasms to occur is in the calf muscles of the leg. Massaging, stretching, and relaxing the affected muscle may relieve the cramp or spasm. Drink enough fluid to keep your urine pale yellow. Staying well hydrated may help prevent cramps or spasms. This information is not intended to replace advice given to you by your health care provider. Make sure you discuss any questions you have with your healthcare provider. Document Revised: 02/18/2018 Document Reviewed: 02/18/2018 Elsevier Patient Education  [place].

## 2021-06-01 ENCOUNTER — Other Ambulatory Visit: Payer: Self-pay

## 2021-06-01 ENCOUNTER — Other Ambulatory Visit (INDEPENDENT_AMBULATORY_CARE_PROVIDER_SITE_OTHER): Payer: No Typology Code available for payment source

## 2021-06-01 DIAGNOSIS — E119 Type 2 diabetes mellitus without complications: Secondary | ICD-10-CM | POA: Diagnosis not present

## 2021-06-01 DIAGNOSIS — E559 Vitamin D deficiency, unspecified: Secondary | ICD-10-CM

## 2021-06-02 LAB — VITAMIN D 25 HYDROXY (VIT D DEFICIENCY, FRACTURES): VITD: 29.28 ng/mL — ABNORMAL LOW (ref 30.00–100.00)

## 2021-06-02 LAB — HEMOGLOBIN A1C: Hgb A1c MFr Bld: 5.9 % (ref 4.6–6.5)

## 2021-06-07 ENCOUNTER — Other Ambulatory Visit: Payer: Self-pay | Admitting: Primary Care

## 2021-06-07 DIAGNOSIS — E119 Type 2 diabetes mellitus without complications: Secondary | ICD-10-CM

## 2021-06-09 ENCOUNTER — Telehealth: Payer: Self-pay | Admitting: Primary Care

## 2021-06-09 NOTE — Telephone Encounter (Signed)
Noted  

## 2021-06-09 NOTE — Telephone Encounter (Signed)
Pt scheduled appt for following via my chart please triage    Visit Type: OFFICE VISIT (1004)    06/15/2021    12:00 PM  20 mins.  Pleas Koch, NP    [facility]    Patient Comments:  Pain in right side of back and underneath righ breast. Hurts more with movement and coughing

## 2021-06-09 NOTE — Telephone Encounter (Signed)
I spoke with pt; for 3 wks on and off pt has naggy pain that with movement or coughing gets sharper rt upper chest(under breast) and back pain. When has pain the pain level is 10. Pt still has gallbladder.Pt has prod cough with (pt has not looked at phlegm for color) which usually has in the morning and dry cough the rest of the day. Pt had H/A yesterday with pain level of 3; no H/A today. No fever or SOB and no other covid symptoms per pt. Pt has had 2 covid vaccines per immunization list. Pt is going to leave 06/15/21 at 12 noon appt pt scheduled on my chart in case needs FU to UC visit. I scheduled pt 06/10/21 at 10 AM at [facility]. This is first appt that fitted pts schedule. UC & ED precautions given and pt voiced understanding. Sending note to Gentry Fitz NP and [facility] CMA.

## 2021-06-10 ENCOUNTER — Ambulatory Visit
Admission: RE | Admit: 2021-06-10 | Discharge: 2021-06-10 | Disposition: A | Discharge status: Home / Self Care | Payer: No Typology Code available for payment source | Source: Ambulatory Visit | Attending: Emergency Medicine | Admitting: Emergency Medicine

## 2021-06-10 ENCOUNTER — Other Ambulatory Visit: Payer: Self-pay

## 2021-06-10 VITALS — BP 119/82 | HR 75 | Temp 97.9°F | Resp 18 | Wt 215.0 lb

## 2021-06-10 DIAGNOSIS — R1011 Right upper quadrant pain: Secondary | ICD-10-CM

## 2021-06-10 DIAGNOSIS — R630 Anorexia: Secondary | ICD-10-CM | POA: Diagnosis not present

## 2021-06-10 DIAGNOSIS — R1013 Epigastric pain: Secondary | ICD-10-CM

## 2021-06-10 DIAGNOSIS — M546 Pain in thoracic spine: Secondary | ICD-10-CM | POA: Diagnosis not present

## 2021-06-10 MED ORDER — DICLOFENAC SODIUM 50 MG PO TBEC: 50.0000 mg | DELAYED_RELEASE_TABLET | Freq: Two times a day (BID) | ORAL | 0 refills | Status: DC

## 2021-06-10 MED ORDER — ONDANSETRON HCL 4 MG PO TABS: 4.0000 mg | ORAL_TABLET | Freq: Four times a day (QID) | ORAL | 0 refills | Status: DC

## 2021-06-10 NOTE — ED Triage Notes (Addendum)
Pt c/o RUQ and upper right back pain started a few weeks ago per patient. Pt states that movement aggravates it. Pt reports yesterday she did have a medial chest pain that radiated to back, lower abd and right groin area (lasted about 30 min).

## 2021-06-10 NOTE — Discharge Instructions (Addendum)
Take diclofenac as prescribed to see if this helps with discomfort. Use Zofran as prescribed as needed for nausea. We are sending out blood work today - the results will go to [facility]. Follow-up with PCP for further evaluation, especially if minimal improvement. Go to the ER if acute worsening pain, vomiting not improved with Zofran, or other new concerning symptoms.

## 2021-06-10 NOTE — ED Provider Notes (Signed)
[facility]    CSN: [id number] Arrival date & time: 06/10/21  [id number]      History   Chief Complaint Chief Complaint  Patient presents with   Abdominal Pain    HPI Diana Lutz is a 55 y.o. female.   Patient presents with concerns of right upper abdominal and back pain for the past few weeks. She reports it comes and goes and she thinks it may becoming more frequent. The patient saw her PCP on 8/9 who determined it was musculoskeletal and prescribed Flexeril. She has had minimal improvement. The patient reports the only thing that seems to reliably cause the pain is when she is bent over, such as leaning on a counter, and then goes to stand up and feels the pinching pain. She states it bothers her the most at night, during sleep as well, but hasn't noticed a clear correlation with eating though she does eat her biggest meal at night. The patient has had decreased appetite and some nausea but no vomiting, diarrhea, constipation, or blood in stool. The patient reports she has unintentionally lost 40 pounds in the past 4 months but blames this and low appetite on going through stressful home dynamics with pending separation. She states one episode yesterday she experienced the pain radiating up into the center of her chest and stomach, across the lower abdomen, and into her groin and right upper leg along with some tingling in her face - this resolved and has not recurred.   The history is provided by the patient.  Abdominal Pain Pain location:  Epigastric and RUQ Pain radiates to:  R shoulder and back Timing:  Intermittent Context: not eating   Relieved by:  Nothing Worsened by:  Movement Associated symptoms: anorexia and nausea   Associated symptoms: no chest pain, no constipation, no cough, no diarrhea, no dysuria, no fatigue, no fever, no hematochezia, no melena, no shortness of breath, no sore throat, no vaginal discharge and no vomiting    Past Medical History:   Diagnosis Date   Abnormal Pap smear of cervix 1991   Anemia    history of anemia   Diabetes mellitus without complication (HCC)    Dysplasia of cervix    cervical ca   GERD (gastroesophageal reflux disease)    Hx of adenomatous polyp of colon 05/30/2020   Hypertension    Migraines     Patient Active Problem List   Diagnosis Date Noted   Acute right-sided thoracic back pain 05/17/2021   Type 2 diabetes mellitus without complication, without long-term current use of insulin ([place]) 06/29/2020   Hx of adenomatous polyp of colon 05/30/2020   Prediabetes 09/23/2018   Gastroesophageal reflux disease 07/20/2017   OSA (obstructive sleep apnea) 12/15/2015   Preventative health care 12/15/2015   Cystocele with rectocele 08/18/2015   Vitamin D deficiency 06/04/2015   Essential hypertension 06/04/2015    Past Surgical History:  Procedure Laterality Date   ANTERIOR AND POSTERIOR REPAIR N/A 08/18/2015   Procedure: ANTERIOR (CYSTOCELE) AND POSTERIOR REPAIR (RECTOCELE)/Perineorrhaphy;  Surgeon: Azucena Fallen, MD;  Location: [facility];  Service: Gynecology;  Laterality: N/A;  2 hrs.   BLADDER SUSPENSION N/A 08/18/2015   Procedure: TRANSVAGINAL TAPE (TVT) Mid-Urethral Sling PROCEDURE;  Surgeon: Azucena Fallen, MD;  Location: [facility];  Service: Gynecology;  Laterality: N/A;   COLONOSCOPY  11/15/2005   normal-Dr.Gessner   CYSTOSCOPY N/A 08/18/2015   Procedure: CYSTOSCOPY;  Surgeon: Azucena Fallen, MD;  Location: [facility];  Service: Gynecology;  Laterality:  N/A;   HYSTEROSCOPY W/ ENDOMETRIAL ABLATION     LASER ABLATION OF THE CERVIX     WISDOM TOOTH EXTRACTION      OB History   No obstetric history on file.      Home Medications    Prior to Admission medications   Medication Sig Start Date End Date Taking? Authorizing Provider  diclofenac (VOLTAREN) 50 MG EC tablet Take 1 tablet (50 mg total) by mouth 2 (two) times daily. 06/10/21  Yes Teasha Murrillo L, PA  ondansetron (ZOFRAN) 4 MG tablet Take 1  tablet (4 mg total) by mouth every 6 (six) hours. 06/10/21  Yes Nicky Kras L, PA  amLODipine (NORVASC) 10 MG tablet TAKE 1 TABLET BY MOUTH EVERY DAY FOR BLOOD PRESSURE 05/25/21   Pleas Koch, NP  calcium carbonate (TUMS - DOSED IN MG ELEMENTAL CALCIUM) 500 MG chewable tablet Chew 2 tablets by mouth daily as needed for indigestion or heartburn.    [provider]  [facility] LUBRICANT EYE DROPS 0.4-0.3 % SOLN APPLY 1 DROP TO EYE 3 (THREE) TIMES DAILY. 02/15/17   [provider]  cyclobenzaprine (FLEXERIL) 5 MG tablet Take 1 tablet (5 mg total) by mouth 3 (three) times daily as needed for muscle spasms. 05/17/21   Pleas Koch, NP  fluticasone (FLONASE) 50 MCG/ACT nasal spray Place 1 spray into both nostrils 2 (two) times daily. 10/19/20 10/19/21  Betancourt, Aura Fey, NP  ibuprofen (ADVIL,MOTRIN) 600 MG tablet Take 600 mg by mouth daily as needed.    [provider]  losartan (COZAAR) 100 MG tablet Take 1 tablet (100 mg total) by mouth daily. For blood pressure. 02/14/21   Pleas Koch, NP  metFORMIN (GLUCOPHAGE-XR) 500 MG 24 hr tablet TAKE 1 TABLET (500 MG TOTAL) BY MOUTH DAILY WITH BREAKFAST. FOR DIABETES. 06/07/21 06/07/22  Pleas Koch, NP  omeprazole (PRILOSEC) 20 MG capsule Take 1 capsule (20 mg total) by mouth daily. For heartburn. 03/15/20   Pleas Koch, NP  rosuvastatin (CRESTOR) 5 MG tablet Take 1 tablet (5 mg total) by mouth every evening. For cholesterol. 03/17/20   Pleas Koch, NP  sodium chloride (OCEAN) 0.65 % SOLN nasal spray Place 2 sprays into both nostrils every 2 (two) hours while awake. 10/19/20 05/26/21  Betancourt, Aura Fey, NP    Family History Family History  Problem Relation Age of Onset   Hypertension Father    Stroke Father    Heart attack Father    Prostate cancer Father    Bladder Cancer Father    Hypertension Mother    Diverticulosis Mother    Colon polyps Mother    Hypertension Brother    Diabetes Brother    Colon cancer  Maternal Grandmother 85   Esophageal cancer Neg Hx    Rectal cancer Neg Hx    Stomach cancer Neg Hx     Social History Social History   Tobacco Use   Smoking status: Never   Smokeless tobacco: Never  Vaping Use   Vaping Use: Never used  Substance Use Topics   Alcohol use: Not Currently    Alcohol/week: 0.0 standard drinks    Comment: social   Drug use: No     Allergies   Patient has no known allergies.   Review of Systems Review of Systems  Constitutional:  Positive for appetite change and unexpected weight change. Negative for fatigue and fever.  HENT:  Negative for sore throat.   Respiratory:  Negative for cough, chest  tightness and shortness of breath.   Cardiovascular:  Negative for chest pain.  Gastrointestinal:  Positive for abdominal pain, anorexia and nausea. Negative for blood in stool, constipation, diarrhea, hematochezia, melena and vomiting.  Genitourinary:  Negative for dysuria and vaginal discharge.  Musculoskeletal:  Positive for back pain. Negative for myalgias.  Skin:  Negative for rash.  Neurological:  Negative for dizziness and headaches.    Physical Exam Triage Vital Signs ED Triage Vitals  Enc Vitals Group     BP 06/10/21 1012 119/82     Pulse Rate 06/10/21 1012 75     Resp 06/10/21 1012 18     Temp 06/10/21 1012 97.9 F (36.6 C)     Temp Source 06/10/21 1012 Oral     SpO2 06/10/21 1012 97 %     Weight --      Height --      Head Circumference --      Peak Flow --      Pain Score 06/10/21 1007 9     Pain Loc --      Pain Edu? --      Excl. in [place]? --    No data found.  Updated Vital Signs BP 119/82 (BP Location: Right Arm)   Pulse 75   Temp 97.9 F (36.6 C) (Oral)   Resp 18   Wt 215 lb (97.5 kg)   SpO2 97%   BMI 33.67 kg/m   Visual Acuity Right Eye Distance:   Left Eye Distance:   Bilateral Distance:    Right Eye Near:   Left Eye Near:    Bilateral Near:     Physical Exam Vitals and nursing note reviewed.   Constitutional:      General: She is not in acute distress. HENT:     Head: Normocephalic.  Eyes:     Conjunctiva/sclera: Conjunctivae normal.     Pupils: Pupils are equal, round, and reactive to light.  Cardiovascular:     Rate and Rhythm: Normal rate and regular rhythm.     Heart sounds: Normal heart sounds.  Pulmonary:     Effort: Pulmonary effort is normal.     Breath sounds: Normal breath sounds.  Abdominal:     Palpations: Abdomen is soft.     Tenderness: There is abdominal tenderness in the epigastric area. There is no right CVA tenderness, left CVA tenderness or rebound.     Comments: Epigastric tenderness. Periumbilical palpation causes referred epigastric discomfort. No RUQ tenderness.   Musculoskeletal:     Comments: Mild tenderness to palpation of right anterior lower ribs and right posterior mid back just under scapula. No specific point tenderness. Full ROM with some discomfort - reproduced when sitting back up from laying down.   Skin:    Findings: No rash.  Neurological:     Mental Status: She is alert.     Gait: Gait normal.  Psychiatric:        Mood and Affect: Mood normal.     UC Treatments / Results  Labs (all labs ordered are listed, but only abnormal results are displayed) Labs Reviewed  CBC WITH DIFFERENTIAL/PLATELET  COMPREHENSIVE METABOLIC PANEL  LIPASE  AMYLASE    EKG   Radiology No results found.  Procedures Procedures (including critical care time)  Medications Ordered in UC Medications - No data to display  Initial Impression / Assessment and Plan / UC Course  I have reviewed the triage vital signs and the nursing notes.  Pertinent labs &  imaging results that were available during my care of the patient were reviewed by me and considered in my medical decision making (see chart for details).     Ddx includes cholelithiasis, pancreatitis, musculoskeletal, more severe causes of unintentional wt loss. 7lb wt loss documented between  today's visit and PCP visit on 05/17/21. Already trialed mm relaxer, will Rx NSAID to help with possible MSK cause, encouraged heat and gentle stretching. CBC, CMP, amylase, and lipase ordered for further evaluation of possible hepatobiliary cause. Encouraged pt to follow-up with PCP for further evaluation such as possible U/S or other imaging of upper abdomen. Discussed red flags/ER precautions.   E/M: 1 acute complicated illness, 4 data (CBC, CMP, amylase, lipase), moderate risk due to prescription management   Final Clinical Impressions(s) / UC Diagnoses   Final diagnoses:  Abdominal pain, epigastric  Abdominal pain, right upper quadrant  Acute right-sided thoracic back pain  Decreased appetite     Discharge Instructions      Take diclofenac as prescribed to see if this helps with discomfort. Use Zofran as prescribed as needed for nausea. We are sending out blood work today - the results will go to [facility]. Follow-up with PCP for further evaluation, especially if minimal improvement. Go to the ER if acute worsening pain, vomiting not improved with Zofran, or other new concerning symptoms.      ED Prescriptions     Medication Sig Dispense Auth. Provider   diclofenac (VOLTAREN) 50 MG EC tablet Take 1 tablet (50 mg total) by mouth 2 (two) times daily. 10 tablet Abner Greenspan, Nanako Stopher L, PA   ondansetron (ZOFRAN) 4 MG tablet Take 1 tablet (4 mg total) by mouth every 6 (six) hours. 12 tablet Abner Greenspan, Jacquiline Zurcher L, PA      PDMP not reviewed this encounter.   Delsa Sale, [place] 06/10/21 1113

## 2021-06-11 LAB — COMPREHENSIVE METABOLIC PANEL
ALT: 17 IU/L (ref 0–32)
AST: 14 IU/L (ref 0–40)
Albumin/Globulin Ratio: 2.1 (ref 1.2–2.2)
Albumin: 4.5 g/dL (ref 3.8–4.9)
Alkaline Phosphatase: 123 IU/L — ABNORMAL HIGH (ref 44–121)
BUN/Creatinine Ratio: 15 (ref 9–23)
BUN: 11 mg/dL (ref 6–24)
Bilirubin Total: 0.5 mg/dL (ref 0.0–1.2)
CO2: 20 mmol/L (ref 20–29)
Calcium: 9.3 mg/dL (ref 8.7–10.2)
Chloride: 107 mmol/L — ABNORMAL HIGH (ref 96–106)
Creatinine, Ser: 0.74 mg/dL (ref 0.57–1.00)
Globulin, Total: 2.1 g/dL (ref 1.5–4.5)
Glucose: 108 mg/dL — ABNORMAL HIGH (ref 65–99)
Potassium: 4.6 mmol/L (ref 3.5–5.2)
Sodium: 142 mmol/L (ref 134–144)
Total Protein: 6.6 g/dL (ref 6.0–8.5)
eGFR: 95 mL/min/{1.73_m2} (ref >59)

## 2021-06-11 LAB — AMYLASE: Amylase: 42 U/L (ref 31–110)

## 2021-06-11 LAB — CBC WITH DIFFERENTIAL/PLATELET
Basophils Absolute: 0 10*3/uL (ref 0.0–0.2)
Basos: 1 %
EOS (ABSOLUTE): 0.1 10*3/uL (ref 0.0–0.4)
Eos: 1 %
Hematocrit: 44.5 % (ref 34.0–46.6)
Hemoglobin: 14.5 g/dL (ref 11.1–15.9)
Immature Grans (Abs): 0 10*3/uL (ref 0.0–0.1)
Immature Granulocytes: 1 %
Lymphocytes Absolute: 1.7 10*3/uL (ref 0.7–3.1)
Lymphs: 32 %
MCH: 28.8 pg (ref 26.6–33.0)
MCHC: 32.6 g/dL (ref 31.5–35.7)
MCV: 88 fL (ref 79–97)
Monocytes Absolute: 0.5 10*3/uL (ref 0.1–0.9)
Monocytes: 10 %
Neutrophils Absolute: 3 10*3/uL (ref 1.4–7.0)
Neutrophils: 55 %
Platelets: 202 10*3/uL (ref 150–450)
RBC: 5.04 x10E6/uL (ref 3.77–5.28)
RDW: 13.7 % (ref 11.7–15.4)
WBC: 5.4 10*3/uL (ref 3.4–10.8)

## 2021-06-11 LAB — LIPASE: Lipase: 26 U/L (ref 14–72)

## 2021-06-13 ENCOUNTER — Other Ambulatory Visit: Payer: Self-pay

## 2021-06-13 ENCOUNTER — Ambulatory Visit
Admission: EM | Admit: 2021-06-13 | Discharge: 2021-06-13 | Disposition: A | Discharge status: Home / Self Care | Payer: No Typology Code available for payment source | Attending: Physician Assistant | Admitting: Physician Assistant

## 2021-06-13 DIAGNOSIS — L255 Unspecified contact dermatitis due to plants, except food: Secondary | ICD-10-CM

## 2021-06-13 DIAGNOSIS — B354 Tinea corporis: Secondary | ICD-10-CM | POA: Diagnosis not present

## 2021-06-13 DIAGNOSIS — L282 Other prurigo: Secondary | ICD-10-CM

## 2021-06-13 MED ORDER — CICLOPIROX OLAMINE 0.77 % EX CREA: TOPICAL_CREAM | Freq: Two times a day (BID) | CUTANEOUS | 0 refills | Status: DC

## 2021-06-13 MED ORDER — METHYLPREDNISOLONE ACETATE 40 MG/ML IJ SUSP
40.0000 mg | Freq: Once | INTRAMUSCULAR | Status: AC
  Administered 2021-06-13: 40 mg via INTRAMUSCULAR

## 2021-06-13 MED ORDER — PREDNISONE 10 MG (21) PO TBPK: ORAL_TABLET | ORAL | 0 refills | Status: DC

## 2021-06-13 NOTE — Discharge Instructions (Addendum)
I believe that you have 2 different causes of your rash.  It appears that you have a yeast infection under your breasts so please use cream twice daily for minimum of 2 weeks.  Keep this area clean and dry as much as possible make sure your blood sugars are well controlled.  I also think that you have some poison ivy on your arms and legs.  We are treating this with prednisone so please take this as directed.  Do not take NSAIDs while on prednisone as this can cause stomach bleeding.  This will raise her blood sugar so please avoid carbohydrates and drink plenty of fluid.  If your blood sugars consistently above 200 please contact us or your primary care provider so we can adjust medication if needed.  If you develop any worsening symptoms including shortness of breath, chest discomfort, fever, nausea, vomiting, widespread rash, difficulty swallowing, difficulty speaking you need to go to the emergency room.

## 2021-06-13 NOTE — ED Provider Notes (Signed)
[facility]    CSN: [id number] Arrival date & time: 06/13/21  0803      History   Chief Complaint Chief Complaint  Patient presents with   Poison Ivy    HPI Diana Lutz is a 55 y.o. female.   Patient presents today with a pruritic rash.  Reports symptoms began at 06/10/2021 and have spread to involve the majority of her body.  She reports intense pruritus underneath bilateral breasts as well as in groin.  She also reports several pruritic/blistery lesions on extremities.  She denies any exposure to poison ivy, insects, plants but does have a dog that is often outside but sleeps in her bed.  She does have a history of allergic reaction to poison ivy and states current symptoms are similar to previous episodes of this condition.  She has not tried anything for symptom management.  Reports she typically requires steroids for management of this condition.  She does have a history of diabetes but reports that her blood sugars are well controlled with oral medications and last A1c was 5.9% within the last few months.  She denies any systemic symptoms including fever, nausea, vomiting, shortness of breath, throat/tongue swelling.  She denies any changes to personal hygiene products including soaps or detergents, medications, recent travel.  Denies any household contacts with similar symptoms.   Past Medical History:  Diagnosis Date   Abnormal Pap smear of cervix 1991   Anemia    history of anemia   Diabetes mellitus without complication (HCC)    Dysplasia of cervix    cervical ca   GERD (gastroesophageal reflux disease)    Hx of adenomatous polyp of colon 05/30/2020   Hypertension    Migraines     Patient Active Problem List   Diagnosis Date Noted   Acute right-sided thoracic back pain 05/17/2021   Type 2 diabetes mellitus without complication, without long-term current use of insulin ([place]) 06/29/2020   Hx of adenomatous polyp of colon 05/30/2020   Prediabetes  09/23/2018   Gastroesophageal reflux disease 07/20/2017   OSA (obstructive sleep apnea) 12/15/2015   Preventative health care 12/15/2015   Cystocele with rectocele 08/18/2015   Vitamin D deficiency 06/04/2015   Essential hypertension 06/04/2015    Past Surgical History:  Procedure Laterality Date   ANTERIOR AND POSTERIOR REPAIR N/A 08/18/2015   Procedure: ANTERIOR (CYSTOCELE) AND POSTERIOR REPAIR (RECTOCELE)/Perineorrhaphy;  Surgeon: Azucena Fallen, MD;  Location: [facility];  Service: Gynecology;  Laterality: N/A;  2 hrs.   BLADDER SUSPENSION N/A 08/18/2015   Procedure: TRANSVAGINAL TAPE (TVT) Mid-Urethral Sling PROCEDURE;  Surgeon: Azucena Fallen, MD;  Location: [facility];  Service: Gynecology;  Laterality: N/A;   COLONOSCOPY  11/15/2005   normal-Dr.Gessner   CYSTOSCOPY N/A 08/18/2015   Procedure: CYSTOSCOPY;  Surgeon: Azucena Fallen, MD;  Location: [facility];  Service: Gynecology;  Laterality: N/A;   HYSTEROSCOPY W/ ENDOMETRIAL ABLATION     LASER ABLATION OF THE CERVIX     WISDOM TOOTH EXTRACTION      OB History   No obstetric history on file.      Home Medications    Prior to Admission medications   Medication Sig Start Date End Date Taking? Authorizing Provider  ciclopirox (LOPROX) 0.77 % cream Apply topically 2 (two) times daily. 06/13/21  Yes Sharie Amorin K, PA-C  predniSONE (STERAPRED UNI-PAK 21 TAB) 10 MG (21) TBPK tablet As directed 06/13/21  Yes Avanni Turnbaugh K, PA-C  amLODipine (NORVASC) 10 MG tablet TAKE 1 TABLET  BY MOUTH EVERY DAY FOR BLOOD PRESSURE 05/25/21   Pleas Koch, NP  calcium carbonate (TUMS - DOSED IN MG ELEMENTAL CALCIUM) 500 MG chewable tablet Chew 2 tablets by mouth daily as needed for indigestion or heartburn.    [provider]  [facility] LUBRICANT EYE DROPS 0.4-0.3 % SOLN APPLY 1 DROP TO EYE 3 (THREE) TIMES DAILY. 02/15/17   [provider]  cyclobenzaprine (FLEXERIL) 5 MG tablet Take 1 tablet (5 mg total) by mouth 3 (three) times daily as needed for  muscle spasms. 05/17/21   Pleas Koch, NP  diclofenac (VOLTAREN) 50 MG EC tablet Take 1 tablet (50 mg total) by mouth 2 (two) times daily. 06/10/21   Abner Greenspan, Amy L, PA  fluticasone (FLONASE) 50 MCG/ACT nasal spray Place 1 spray into both nostrils 2 (two) times daily. 10/19/20 10/19/21  Betancourt, Aura Fey, NP  ibuprofen (ADVIL,MOTRIN) 600 MG tablet Take 600 mg by mouth daily as needed.    [provider]  losartan (COZAAR) 100 MG tablet Take 1 tablet (100 mg total) by mouth daily. For blood pressure. 02/14/21   Pleas Koch, NP  metFORMIN (GLUCOPHAGE-XR) 500 MG 24 hr tablet TAKE 1 TABLET (500 MG TOTAL) BY MOUTH DAILY WITH BREAKFAST. FOR DIABETES. 06/07/21 06/07/22  Pleas Koch, NP  omeprazole (PRILOSEC) 20 MG capsule Take 1 capsule (20 mg total) by mouth daily. For heartburn. 03/15/20   Pleas Koch, NP  ondansetron (ZOFRAN) 4 MG tablet Take 1 tablet (4 mg total) by mouth every 6 (six) hours. 06/10/21   Abner Greenspan, Amy L, PA  rosuvastatin (CRESTOR) 5 MG tablet Take 1 tablet (5 mg total) by mouth every evening. For cholesterol. 03/17/20   Pleas Koch, NP  sodium chloride (OCEAN) 0.65 % SOLN nasal spray Place 2 sprays into both nostrils every 2 (two) hours while awake. 10/19/20 05/26/21  Betancourt, Aura Fey, NP    Family History Family History  Problem Relation Age of Onset   Hypertension Father    Stroke Father    Heart attack Father    Prostate cancer Father    Bladder Cancer Father    Hypertension Mother    Diverticulosis Mother    Colon polyps Mother    Hypertension Brother    Diabetes Brother    Colon cancer Maternal Grandmother 71   Esophageal cancer Neg Hx    Rectal cancer Neg Hx    Stomach cancer Neg Hx     Social History Social History   Tobacco Use   Smoking status: Never   Smokeless tobacco: Never  Vaping Use   Vaping Use: Never used  Substance Use Topics   Alcohol use: Not Currently    Alcohol/week: 0.0 standard drinks    Comment: social   Drug  use: No     Allergies   Patient has no known allergies.   Review of Systems Review of Systems  Constitutional:  Negative for activity change, appetite change, fatigue and fever.  Respiratory:  Negative for cough and shortness of breath.   Cardiovascular:  Negative for chest pain.  Gastrointestinal:  Negative for abdominal pain, diarrhea, nausea and vomiting.  Skin:  Positive for rash.  Neurological:  Negative for dizziness, light-headedness and headaches.    Physical Exam Triage Vital Signs ED Triage Vitals  Enc Vitals Group     BP 06/13/21 0815 109/77     Pulse Rate 06/13/21 0815 72     Resp 06/13/21 0815 18     Temp 06/13/21  0815 97.9 F (36.6 C)     Temp Source 06/13/21 0815 Oral     SpO2 06/13/21 0815 98 %     Weight --      Height --      Head Circumference --      Peak Flow --      Pain Score 06/13/21 0813 0     Pain Loc --      Pain Edu? --      Excl. in [place]? --    No data found.  Updated Vital Signs BP 109/77 (BP Location: Right Arm)   Pulse 72   Temp 97.9 F (36.6 C) (Oral)   Resp 18   SpO2 98%   Visual Acuity Right Eye Distance:   Left Eye Distance:   Bilateral Distance:    Right Eye Near:   Left Eye Near:    Bilateral Near:     Physical Exam Vitals reviewed.  Constitutional:      General: She is awake. She is not in acute distress.    Appearance: Normal appearance. She is normal weight. She is not ill-appearing.     Comments: Very pleasant female presented to no acute distress sitting comfortably in exam room  HENT:     Head: Normocephalic and atraumatic.  Cardiovascular:     Rate and Rhythm: Normal rate and regular rhythm.     Heart sounds: Normal heart sounds, S1 normal and S2 normal. No murmur heard. Pulmonary:     Effort: Pulmonary effort is normal.     Breath sounds: Normal breath sounds. No wheezing, rhonchi or rales.     Comments: Clear to auscultation bilaterally Skin:      Psychiatric:        Behavior: Behavior is  cooperative.     UC Treatments / Results  Labs (all labs ordered are listed, but only abnormal results are displayed) Labs Reviewed - No data to display  EKG   Radiology No results found.  Procedures Procedures (including critical care time)  Medications Ordered in UC Medications  methylPREDNISolone acetate (DEPO-MEDROL) injection 40 mg (has no administration in time range)    Initial Impression / Assessment and Plan / UC Course  I have reviewed the triage vital signs and the nursing notes.  Pertinent labs & imaging results that were available during my care of the patient were reviewed by me and considered in my medical decision making (see chart for details).      Patient appears to have 2 separate dermatological conditions.  Rash appearance under breasts has fungal characteristics so we will treat with Loprox twice daily for minimum of 2 weeks.  Encourage patient to keep area clean and dry as much as possible.  Rash noted on extremities or consistent with rhus dermatitis so we will treat with steroids.  Discussed that this will raise her blood sugar and she should avoid carbohydrates and drink plenty of fluid while using steroids.  She is to monitor blood sugar closely and if this remains above 200 consistently she needs to be reevaluated by either our clinic or her primary care provider.  Discussed that she should not take NSAIDs while on prednisone due to risk of GI bleeding.  She can use over-the-counter antihistamines for additional symptom relief.  Discussed alarm symptoms that warrant emergent evaluation including signs of systemic illness.  Strict return precautions given to which patient expressed understanding.  Final Clinical Impressions(s) / UC Diagnoses   Final diagnoses:  Rhus dermatitis  Pruritic  rash  Tinea corporis     Discharge Instructions      I believe that you have 2 different causes of your rash.  It appears that you have a yeast infection under  your breasts so please use cream twice daily for minimum of 2 weeks.  Keep this area clean and dry as much as possible make sure your blood sugars are well controlled.  I also think that you have some poison ivy on your arms and legs.  We are treating this with prednisone so please take this as directed.  Do not take NSAIDs while on prednisone as this can cause stomach bleeding.  This will raise her blood sugar so please avoid carbohydrates and drink plenty of fluid.  If your blood sugars consistently above 200 please contact us or your primary care provider so we can adjust medication if needed.  If you develop any worsening symptoms including shortness of breath, chest discomfort, fever, nausea, vomiting, widespread rash, difficulty swallowing, difficulty speaking you need to go to the emergency room.     ED Prescriptions     Medication Sig Dispense Auth. Provider   predniSONE (STERAPRED UNI-PAK 21 TAB) 10 MG (21) TBPK tablet As directed 21 tablet Burlie Cajamarca K, PA-C   ciclopirox (LOPROX) 0.77 % cream Apply topically 2 (two) times daily. 90 g Thien Berka, Derry Skill, PA-C      PDMP not reviewed this encounter.   Terrilee Croak, PA-C 06/13/21 [phone]

## 2021-06-13 NOTE — ED Triage Notes (Signed)
Pt c/o possible poison ivy, inflammation over body. Patient denies SOB.  Started: 9/2

## 2021-06-15 ENCOUNTER — Other Ambulatory Visit: Payer: Self-pay

## 2021-06-15 ENCOUNTER — Encounter: Payer: Self-pay | Admitting: Primary Care

## 2021-06-15 ENCOUNTER — Ambulatory Visit: Payer: No Typology Code available for payment source | Admitting: Primary Care

## 2021-06-15 VITALS — BP 119/62 | HR 62 | Temp 98.1°F | Ht 67.0 in | Wt 216.0 lb

## 2021-06-15 DIAGNOSIS — R0789 Other chest pain: Secondary | ICD-10-CM

## 2021-06-15 DIAGNOSIS — F4323 Adjustment disorder with mixed anxiety and depressed mood: Secondary | ICD-10-CM

## 2021-06-15 DIAGNOSIS — R1011 Right upper quadrant pain: Secondary | ICD-10-CM | POA: Diagnosis not present

## 2021-06-15 DIAGNOSIS — Z23 Encounter for immunization: Secondary | ICD-10-CM

## 2021-06-15 HISTORY — DX: Right upper quadrant pain: R10.11

## 2021-06-15 MED ORDER — ESCITALOPRAM OXALATE 10 MG PO TABS: 10.0000 mg | ORAL_TABLET | Freq: Every day | ORAL | 1 refills | Status: DC

## 2021-06-15 NOTE — Progress Notes (Signed)
Subjective:    Patient ID: Diana Lutz, female    DOB: 1966-03-29, 55 y.o.   MRN: [id number]  HPI  Diana Lutz is a very pleasant 55 y.o. female with a history of hypertension, type 2 diabetes, GERD, OSA who presents today to discuss abdominal pain and anxiety.   She was originally evaluated for back pain on 05/17/21, mid/right thoracic back pain with radiation around the lateral and front right chest x 3 weeks. She reported working in an environment with frequent lifting, bending, and twisting, was able to provoke symptoms with movement. During this visit we treated with cyclobenzaprine, stretching, Tylenol/Ibuprofen.  Since her last visit with [place], her thoracic back pain resolved. Her symptoms today are different.   Her pain now is located to the right mid lateral chest wall with radiation through to her RUQ, sometimes to the epigastric region. Symptoms are constant during the day, worse at night. She eats mostly at night. She denies nausea and vomiting, diarrhea, constipation. She is compliant to her omeprazole 20 mg daily.  She was evaluated at urgent care on 06/10/21 for same symptoms, underwent CBC, lipase, amylase, CMP, all of which were negative. She was provided with diclofenac to use PRN for presumed MSK etiology.  Today she endorses a lot of increased stress as she is going through a separation and divorce from her husband. Little appetite, endorses losing 40 pounds over the last several months. Symptoms of anxiety/stress began about 3-4 months ago. Other symptoms include feeling anxious, not sleeping, feeling down/sad, tearfulness.   She has free therapy through her occupation, plans on initiating treatment. She was placed on Zoloft 20+ years ago after her first divorce, took for 3 days and stopped due to nausea.   Wt Readings from Last 3 Encounters:  06/15/21 216 lb (98 kg)  06/10/21 215 lb (97.5 kg)  05/17/21 221 lb (100.2 kg)     Review of Systems   Constitutional:  Negative for fever.  Respiratory:  Negative for cough and shortness of breath.   Gastrointestinal:  Positive for abdominal pain. Negative for constipation, diarrhea, nausea and vomiting.  Psychiatric/Behavioral:  Positive for sleep disturbance. The patient is nervous/anxious.         Past Medical History:  Diagnosis Date   Abnormal Pap smear of cervix 1991   Anemia    history of anemia   Diabetes mellitus without complication (HCC)    Dysplasia of cervix    cervical ca   GERD (gastroesophageal reflux disease)    Hx of adenomatous polyp of colon 05/30/2020   Hypertension    Migraines     Social History   Socioeconomic History   Marital status: Married    Spouse name: Not on file   Number of children: Not on file   Years of education: Not on file   Highest education level: Not on file  Occupational History   Not on file  Tobacco Use   Smoking status: Never   Smokeless tobacco: Never  Vaping Use   Vaping Use: Never used  Substance and Sexual Activity   Alcohol use: Not Currently    Alcohol/week: 0.0 standard drinks    Comment: social   Drug use: No   Sexual activity: Not Currently  Other Topics Concern   Not on file  Social History Narrative   Married.   3 children   Works for [employer] as Engineer, manufacturing systems.   Enjoys four wheeling, working on the house   Social Determinants of  Health   Financial Resource Strain: Not on file  Food Insecurity: Not on file  Transportation Needs: Not on file  Physical Activity: Not on file  Stress: Not on file  Social Connections: Not on file  Intimate Partner Violence: Not on file    Past Surgical History:  Procedure Laterality Date   ANTERIOR AND POSTERIOR REPAIR N/A 08/18/2015   Procedure: ANTERIOR (CYSTOCELE) AND POSTERIOR REPAIR (RECTOCELE)/Perineorrhaphy;  Surgeon: Azucena Fallen, MD;  Location: [facility];  Service: Gynecology;  Laterality: N/A;  2 hrs.   BLADDER SUSPENSION N/A 08/18/2015   Procedure:  TRANSVAGINAL TAPE (TVT) Mid-Urethral Sling PROCEDURE;  Surgeon: Azucena Fallen, MD;  Location: [facility];  Service: Gynecology;  Laterality: N/A;   COLONOSCOPY  11/15/2005   normal-Dr.Gessner   CYSTOSCOPY N/A 08/18/2015   Procedure: CYSTOSCOPY;  Surgeon: Azucena Fallen, MD;  Location: [facility];  Service: Gynecology;  Laterality: N/A;   HYSTEROSCOPY W/ ENDOMETRIAL ABLATION     LASER ABLATION OF THE CERVIX     WISDOM TOOTH EXTRACTION      Family History  Problem Relation Age of Onset   Hypertension Father    Stroke Father    Heart attack Father    Prostate cancer Father    Bladder Cancer Father    Hypertension Mother    Diverticulosis Mother    Colon polyps Mother    Hypertension Brother    Diabetes Brother    Colon cancer Maternal Grandmother 67   Esophageal cancer Neg Hx    Rectal cancer Neg Hx    Stomach cancer Neg Hx     No Known Allergies  Current Outpatient Medications on File Prior to Visit  Medication Sig Dispense Refill   amLODipine (NORVASC) 10 MG tablet TAKE 1 TABLET BY MOUTH EVERY DAY FOR BLOOD PRESSURE 90 tablet 0   calcium carbonate (TUMS - DOSED IN MG ELEMENTAL CALCIUM) 500 MG chewable tablet Chew 2 tablets by mouth daily as needed for indigestion or heartburn.     ciclopirox (LOPROX) 0.77 % cream Apply topically 2 (two) times daily. 90 g 0   [facility] LUBRICANT EYE DROPS 0.4-0.3 % SOLN APPLY 1 DROP TO EYE 3 (THREE) TIMES DAILY.  12   cyclobenzaprine (FLEXERIL) 5 MG tablet Take 1 tablet (5 mg total) by mouth 3 (three) times daily as needed for muscle spasms. 15 tablet 0   diclofenac (VOLTAREN) 50 MG EC tablet Take 1 tablet (50 mg total) by mouth 2 (two) times daily. 10 tablet 0   fluticasone (FLONASE) 50 MCG/ACT nasal spray Place 1 spray into both nostrils 2 (two) times daily. 16 g 6   ibuprofen (ADVIL,MOTRIN) 600 MG tablet Take 600 mg by mouth daily as needed.     losartan (COZAAR) 100 MG tablet Take 1 tablet (100 mg total) by mouth daily. For blood pressure. 90 tablet 0    metFORMIN (GLUCOPHAGE-XR) 500 MG 24 hr tablet TAKE 1 TABLET (500 MG TOTAL) BY MOUTH DAILY WITH BREAKFAST. FOR DIABETES. 90 tablet 0   omeprazole (PRILOSEC) 20 MG capsule Take 1 capsule (20 mg total) by mouth daily. For heartburn. 90 capsule 1   ondansetron (ZOFRAN) 4 MG tablet Take 1 tablet (4 mg total) by mouth every 6 (six) hours. 12 tablet 0   predniSONE (STERAPRED UNI-PAK 21 TAB) 10 MG (21) TBPK tablet As directed 21 tablet 0   rosuvastatin (CRESTOR) 5 MG tablet Take 1 tablet (5 mg total) by mouth every evening. For cholesterol. 90 tablet 3   No current facility-administered medications on  file prior to visit.    BP 119/62   Pulse 62   Temp 98.1 F (36.7 C) (Temporal)   Ht '5\' 7"'$  (1.702 m)   Wt 216 lb (98 kg)   SpO2 97%   BMI 33.83 kg/m  Objective:   Physical Exam Constitutional:      General: She is not in acute distress.    Appearance: She is not ill-appearing.  Abdominal:     General: Bowel sounds are normal.     Palpations: Abdomen is soft.     Tenderness: There is abdominal tenderness in the periumbilical area. Negative signs include Murphy's sign.     Hernia: No hernia is present.  Neurological:     Mental Status: She is alert.          Assessment & Plan:      This visit occurred during the SARS-CoV-2 public health emergency.  Safety protocols were in place, including screening questions prior to the visit, additional usage of staff PPE, and extensive cleaning of exam room while observing appropriate contact time as indicated for disinfecting solutions.

## 2021-06-15 NOTE — Assessment & Plan Note (Signed)
Chronic for 3-4 months, going through a separation and divorce, tearful today.  Discussed options for treatment, she will connect with therapy through her employer. She also elects for medication treatment.   Rx for Lexapro 10 mg sent to pharmacy. Patient is to take 1/2 tablet daily for 8 days, then advance to 1 full tablet thereafter. We discussed possible side effects of headache, GI upset, drowsiness, and SI/HI. If thoughts of SI/HI develop, we discussed to present to the emergency immediately. Patient verbalized understanding.   Follow up in 6 weeks for re-evaluation.

## 2021-06-15 NOTE — Patient Instructions (Addendum)
You will be contacted regarding your ultrasound. Also mention that you need a chest xray.    Start escitalopram (Lexapro) 10 mg for anxiety and depression. Take 1/2 tablet by mouth once daily for about one week, then increase to 1 full tablet thereafter.   Please contact me if you experience any problems when starting this medication or with the dose increase to 1 full tablet. Common side effects should abate within 1-2 weeks.   Please schedule a follow up visit for 6 weeks for follow up of anxiety/depression.  It was a pleasure to see you today!       Influenza (Flu) Vaccine (Inactivated or Recombinant): What You Need to Know 1. Why get vaccinated? Influenza vaccine can prevent influenza (flu). Flu is a contagious disease that spreads around the [place] every year, usually between October and May. Anyone can get the flu, but it is more dangerous for some people. Infants and young children, people 39 years and older, pregnant people, and people with certain health conditions or a weakened immune system are at greatest risk of flu complications. Pneumonia, bronchitis, sinus infections, and ear infections are examples of flu-related complications. If you have a medical condition, such as heart disease, cancer, or diabetes, flu can make it worse. Flu can cause fever and chills, sore throat, muscle aches, fatigue, cough, headache, and runny or stuffy nose. Some people may have vomiting and diarrhea, though this is more common in children than adults. In an average year, thousands of people in the [place] die from flu, and many more are hospitalized. Flu vaccine prevents millions of illnesses and flu-related visits to the doctor each year. 2. Influenza vaccines CDC recommends everyone 6 months and older get vaccinated every flu season. Children 6 months through 39 years of age may need 2 doses during a single flu season. Everyone else needs only 1 dose each flu season. It takes about 2  weeks for protection to develop after vaccination. There are many flu viruses, and they are always changing. Each year a new flu vaccine is made to protect against the influenza viruses believed to be likely to cause disease in the upcoming flu season. Even when the vaccine doesn't exactly match these viruses, it may still provide some protection. Influenza vaccine does not cause flu. Influenza vaccine may be given at the same time as other vaccines. 3. Talk with your health care provider Tell your vaccination provider if the person getting the vaccine: Has had an allergic reaction after a previous dose of influenza vaccine, or has any severe, life-threatening allergies Has ever had Guillain-Barr Syndrome (also called "GBS") In some cases, your health care provider may decide to postpone influenza vaccination until a future visit. Influenza vaccine can be administered at any time during pregnancy. People who are or will be pregnant during influenza season should receive inactivated influenza vaccine. People with minor illnesses, such as a cold, may be vaccinated. People who are moderately or severely ill should usually wait until they recover before getting influenza vaccine. Your health care provider can give you more information. 4. Risks of a vaccine reaction Soreness, redness, and swelling where the shot is given, fever, muscle aches, and headache can happen after influenza vaccination. There may be a very small increased risk of Guillain-Barr Syndrome (GBS) after inactivated influenza vaccine (the flu shot). Young children who get the flu shot along with pneumococcal vaccine (PCV13) and/or DTaP vaccine at the same time might be slightly more likely to have a  seizure caused by fever. Tell your health care provider if a child who is getting flu vaccine has ever had a seizure. People sometimes faint after medical procedures, including vaccination. Tell your provider if you feel dizzy or have  vision changes or ringing in the ears. As with any medicine, there is a very remote chance of a vaccine causing a severe allergic reaction, other serious injury, or death. 5. What if there is a serious problem? An allergic reaction could occur after the vaccinated person leaves the clinic. If you see signs of a severe allergic reaction (hives, swelling of the face and throat, difficulty breathing, a fast heartbeat, dizziness, or weakness), call 9-1-1 and get the person to the nearest hospital. For other signs that concern you, call your health care provider. Adverse reactions should be reported to the Vaccine Adverse Event Reporting System (VAERS). Your health care provider will usually file this report, or you can do it yourself. Visit the VAERS website at www.vaers.SamedayNews.es or call 567-230-3350. VAERS is only for reporting reactions, and VAERS staff members do not give medical advice. 6. The National Vaccine Injury Compensation Program The [employer] Vaccine Injury Compensation Program (VICP) is a federal program that was created to compensate people who may have been injured by certain vaccines. Claims regarding alleged injury or death due to vaccination have a time limit for filing, which may be as short as two years. Visit the VICP website at GoldCloset.com.ee or call 431 435 5735 to learn about the program and about filing a claim. 7. How can I learn more? Ask your health care provider. Call your local or state health department. Visit the website of the Food and Drug Administration (FDA) for vaccine package inserts and additional information at TraderRating.uy. Contact the Centers for Disease Control and Prevention (CDC): Call 9797222591 (1-800-CDC-INFO) or Visit CDC's website at https://gibson.com/. Vaccine Information Statement Inactivated Influenza Vaccine (05/14/2020) This information is not intended to replace advice given to you by your health  care provider. Make sure you discuss any questions you have with your health care provider. Document Revised: 07/01/2020 Document Reviewed: 07/01/2020 Elsevier Patient Education  2022 [employer].

## 2021-06-15 NOTE — Assessment & Plan Note (Signed)
Exam overall negative. Labs reviewed from urgent care and are grossly unremarkable.  Weight loss of 26 pounds over the last year. Stable weight since urgent care visit one week ago.   Will obtain ultrasound and chest xray. Evaluate for gall bladder involvement. Pancreatic enzymes negative as well as CBC.   Await results.

## 2021-06-17 ENCOUNTER — Ambulatory Visit
Admission: RE | Admit: 2021-06-17 | Discharge: 2021-06-17 | Disposition: A | Discharge status: Home / Self Care | Payer: No Typology Code available for payment source | Source: Ambulatory Visit | Attending: Primary Care | Admitting: Primary Care

## 2021-06-17 DIAGNOSIS — R1011 Right upper quadrant pain: Secondary | ICD-10-CM

## 2021-06-17 DIAGNOSIS — R0789 Other chest pain: Secondary | ICD-10-CM

## 2021-06-30 ENCOUNTER — Other Ambulatory Visit: Payer: Self-pay | Admitting: Primary Care

## 2021-06-30 DIAGNOSIS — F4323 Adjustment disorder with mixed anxiety and depressed mood: Secondary | ICD-10-CM

## 2021-07-21 ENCOUNTER — Ambulatory Visit (INDEPENDENT_AMBULATORY_CARE_PROVIDER_SITE_OTHER): Payer: No Typology Code available for payment source

## 2021-07-21 ENCOUNTER — Ambulatory Visit: Payer: No Typology Code available for payment source | Admitting: Primary Care

## 2021-07-21 ENCOUNTER — Other Ambulatory Visit: Payer: Self-pay

## 2021-07-21 ENCOUNTER — Encounter: Payer: Self-pay | Admitting: Primary Care

## 2021-07-21 VITALS — BP 110/72 | HR 71 | Temp 97.7°F | Ht 67.0 in | Wt 208.0 lb

## 2021-07-21 DIAGNOSIS — R0789 Other chest pain: Secondary | ICD-10-CM

## 2021-07-21 DIAGNOSIS — R1011 Right upper quadrant pain: Secondary | ICD-10-CM

## 2021-07-21 DIAGNOSIS — F4323 Adjustment disorder with mixed anxiety and depressed mood: Secondary | ICD-10-CM | POA: Diagnosis not present

## 2021-07-21 NOTE — Assessment & Plan Note (Signed)
Continued and progressing, ultrasound without obvious cause for symptoms. All labs grossly unremarkable.  Checking chest xray today. Ordered CT abdomen with contrast.  She appears stable for outpatient treatment.

## 2021-07-21 NOTE — Patient Instructions (Signed)
Complete xray(s) prior to leaving today. I will notify you of your results once received.  You will be contacted regarding your CT scan.  Please let us know if you have not been contacted within a few days.  It was a pleasure to see you today!

## 2021-07-21 NOTE — Assessment & Plan Note (Signed)
Resolved according to patient, never took Lexapro.   Will discontinue. Continue to monitor

## 2021-07-21 NOTE — Progress Notes (Signed)
Subjective:    Patient ID: Diana Lutz, female    DOB: [birth year], 55 y.o.   MRN: [id number]  HPI  Diana Lutz is a very pleasant 55 y.o. female with a history of hypertension, OSA, type 2 diabetes, anxiety and depression who presents today for follow up of anxiety and depression.   She was last evaluated on 06/15/21 for chronic symptoms of anxiety and depression, going through a separation and divorce. Given symptoms we initiated treatment with Lexapro 10 mg. She mentioned that she was going to connect with therapy through her employer. We asked her to follow up today.   Today she endorses she never took Lexapro as she decided to work on herself and stop focusing on her ex husband. She is sleeping much better. She denies feeling down/sad, tearful, anxious. She is working on herself in terms of diet. She never looked into therapy.   Since her last visit she continues to experience right upper quadrant pain with radiation through her right lower thoracic back. Pain will radiate from right upper abdomen to right upper chest with movement. Pain is now constant, increased from last visit, feels dull and achy constantly, worse with walking and movement. Over the last 3-4 days she's developed the same pain to the left upper abdomen without radiation.   She is eating and drinking well, symptoms do increase at times with eating. She denies shortness of breath, fevers, diarrhea, constipation, bloody stools, nausea/vomiting.Marland Kitchen She was a prior smoker, quit about 20 years ago. She admits to losing nearly 40 pounds due to stress with her divorce, but is now intentionally trying to lose weight through a healthy diet.   Wt Readings from Last 3 Encounters:  07/21/21 208 lb (94.3 kg)  06/15/21 216 lb (98 kg)  06/10/21 215 lb (97.5 kg)      Review of Systems  Constitutional:  Negative for unexpected weight change.  Gastrointestinal:  Positive for abdominal pain. Negative for blood in stool,  constipation, diarrhea and nausea.  Musculoskeletal:  Positive for back pain.        Past Medical History:  Diagnosis Date   Abnormal Pap smear of cervix 1991   Anemia    history of anemia   Diabetes mellitus without complication (HCC)    Dysplasia of cervix    cervical ca   GERD (gastroesophageal reflux disease)    Hx of adenomatous polyp of colon 05/30/2020   Hypertension    Migraines     Social History   Socioeconomic History   Marital status: Married    Spouse name: Not on file   Number of children: Not on file   Years of education: Not on file   Highest education level: Not on file  Occupational History   Not on file  Tobacco Use   Smoking status: Never   Smokeless tobacco: Never  Vaping Use   Vaping Use: Never used  Substance and Sexual Activity   Alcohol use: Not Currently    Alcohol/week: 0.0 standard drinks    Comment: social   Drug use: No   Sexual activity: Not Currently  Other Topics Concern   Not on file  Social History Narrative   Married.   3 children   Works for [employer] as Engineer, manufacturing systems.   Enjoys four wheeling, working on the house   Social Determinants of Health   Financial Resource Strain: Not on file  Food Insecurity: Not on file  Transportation Needs: Not on file  Physical Activity:  Not on file  Stress: Not on file  Social Connections: Not on file  Intimate Partner Violence: Not on file    Past Surgical History:  Procedure Laterality Date   ANTERIOR AND POSTERIOR REPAIR N/A 08/18/2015   Procedure: ANTERIOR (CYSTOCELE) AND POSTERIOR REPAIR (RECTOCELE)/Perineorrhaphy;  Surgeon: Azucena Fallen, MD;  Location: [facility];  Service: Gynecology;  Laterality: N/A;  2 hrs.   BLADDER SUSPENSION N/A 08/18/2015   Procedure: TRANSVAGINAL TAPE (TVT) Mid-Urethral Sling PROCEDURE;  Surgeon: Azucena Fallen, MD;  Location: [facility];  Service: Gynecology;  Laterality: N/A;   COLONOSCOPY  11/15/2005   normal-Dr.Gessner   CYSTOSCOPY N/A 08/18/2015    Procedure: CYSTOSCOPY;  Surgeon: Azucena Fallen, MD;  Location: [facility];  Service: Gynecology;  Laterality: N/A;   HYSTEROSCOPY W/ ENDOMETRIAL ABLATION     LASER ABLATION OF THE CERVIX     WISDOM TOOTH EXTRACTION      Family History  Problem Relation Age of Onset   Hypertension Father    Stroke Father    Heart attack Father    Prostate cancer Father    Bladder Cancer Father    Hypertension Mother    Diverticulosis Mother    Colon polyps Mother    Hypertension Brother    Diabetes Brother    Colon cancer Maternal Grandmother 83   Esophageal cancer Neg Hx    Rectal cancer Neg Hx    Stomach cancer Neg Hx     No Known Allergies  Current Outpatient Medications on File Prior to Visit  Medication Sig Dispense Refill   amLODipine (NORVASC) 10 MG tablet TAKE 1 TABLET BY MOUTH EVERY DAY FOR BLOOD PRESSURE 90 tablet 0   calcium carbonate (TUMS - DOSED IN MG ELEMENTAL CALCIUM) 500 MG chewable tablet Chew 2 tablets by mouth daily as needed for indigestion or heartburn.     ciclopirox (LOPROX) 0.77 % cream Apply topically 2 (two) times daily. 90 g 0   [facility] LUBRICANT EYE DROPS 0.4-0.3 % SOLN APPLY 1 DROP TO EYE 3 (THREE) TIMES DAILY.  12   cyclobenzaprine (FLEXERIL) 5 MG tablet Take 1 tablet (5 mg total) by mouth 3 (three) times daily as needed for muscle spasms. 15 tablet 0   diclofenac (VOLTAREN) 50 MG EC tablet Take 1 tablet (50 mg total) by mouth 2 (two) times daily. 10 tablet 0   escitalopram (LEXAPRO) 10 MG tablet Take 1 tablet (10 mg total) by mouth daily. For anxiety and depression. 30 tablet 1   fluticasone (FLONASE) 50 MCG/ACT nasal spray Place 1 spray into both nostrils 2 (two) times daily. 16 g 6   ibuprofen (ADVIL,MOTRIN) 600 MG tablet Take 600 mg by mouth daily as needed.     losartan (COZAAR) 100 MG tablet Take 1 tablet (100 mg total) by mouth daily. For blood pressure. 90 tablet 0   metFORMIN (GLUCOPHAGE-XR) 500 MG 24 hr tablet TAKE 1 TABLET (500 MG TOTAL) BY MOUTH DAILY WITH  BREAKFAST. FOR DIABETES. 90 tablet 0   omeprazole (PRILOSEC) 20 MG capsule Take 1 capsule (20 mg total) by mouth daily. For heartburn. 90 capsule 1   ondansetron (ZOFRAN) 4 MG tablet Take 1 tablet (4 mg total) by mouth every 6 (six) hours. 12 tablet 0   predniSONE (STERAPRED UNI-PAK 21 TAB) 10 MG (21) TBPK tablet As directed 21 tablet 0   rosuvastatin (CRESTOR) 5 MG tablet Take 1 tablet (5 mg total) by mouth every evening. For cholesterol. 90 tablet 3   No current facility-administered medications on file  prior to visit.    BP 110/72   Pulse 71   Temp 97.7 F (36.5 C) (Temporal)   Ht 5\' 7"  (1.702 m)   Wt 208 lb (94.3 kg)   SpO2 97%   BMI 32.58 kg/m  Objective:   Physical Exam Cardiovascular:     Rate and Rhythm: Normal rate and regular rhythm.  Pulmonary:     Effort: Pulmonary effort is normal.     Breath sounds: Normal breath sounds.  Abdominal:     Tenderness: There is no abdominal tenderness.       Comments: Non tender, but patient did experience RUQ pain while laying down on exam table.  Musculoskeletal:     Cervical back: Neck supple.     Thoracic back: No tenderness or bony tenderness. Normal range of motion.       Back:  Skin:    General: Skin is warm and dry.          Assessment & Plan:      This visit occurred during the SARS-CoV-2 public health emergency.  Safety protocols were in place, including screening questions prior to the visit, additional usage of staff PPE, and extensive cleaning of exam room while observing appropriate contact time as indicated for disinfecting solutions.

## 2021-07-28 ENCOUNTER — Other Ambulatory Visit: Payer: Self-pay

## 2021-07-28 DIAGNOSIS — R1011 Right upper quadrant pain: Secondary | ICD-10-CM

## 2021-08-01 ENCOUNTER — Other Ambulatory Visit: Payer: Self-pay

## 2021-08-01 ENCOUNTER — Other Ambulatory Visit (INDEPENDENT_AMBULATORY_CARE_PROVIDER_SITE_OTHER): Payer: No Typology Code available for payment source

## 2021-08-01 DIAGNOSIS — R1011 Right upper quadrant pain: Secondary | ICD-10-CM

## 2021-08-01 LAB — BASIC METABOLIC PANEL
BUN: 13 mg/dL (ref 6–23)
CO2: 27 mEq/L (ref 19–32)
Calcium: 9.1 mg/dL (ref 8.4–10.5)
Chloride: 105 mEq/L (ref 96–112)
Creatinine, Ser: 0.71 mg/dL (ref 0.40–1.20)
GFR: 94.67 mL/min (ref >60.00)
Glucose, Bld: 119 mg/dL — ABNORMAL HIGH (ref 70–99)
Potassium: 4.4 mEq/L (ref 3.5–5.1)
Sodium: 141 mEq/L (ref 135–145)

## 2021-08-02 ENCOUNTER — Ambulatory Visit (INDEPENDENT_AMBULATORY_CARE_PROVIDER_SITE_OTHER)
Admission: RE | Admit: 2021-08-02 | Discharge: 2021-08-02 | Disposition: A | Discharge status: Home / Self Care | Payer: No Typology Code available for payment source | Source: Ambulatory Visit | Attending: Primary Care | Admitting: Primary Care

## 2021-08-02 DIAGNOSIS — R1011 Right upper quadrant pain: Secondary | ICD-10-CM

## 2021-08-02 MED ORDER — IOHEXOL 350 MG/ML SOLN
100.0000 mL | Freq: Once | INTRAVENOUS | Status: AC | PRN
  Administered 2021-08-02: 100 mL via INTRAVENOUS

## 2021-08-03 ENCOUNTER — Telehealth: Payer: Self-pay | Admitting: Registered Nurse

## 2021-08-03 ENCOUNTER — Encounter: Payer: Self-pay | Admitting: Registered Nurse

## 2021-08-03 DIAGNOSIS — Z20822 Contact with and (suspected) exposure to covid-19: Secondary | ICD-10-CM

## 2021-08-03 DIAGNOSIS — U071 COVID-19: Secondary | ICD-10-CM

## 2021-08-03 MED ORDER — BENZONATATE 200 MG PO CAPS: 200.0000 mg | ORAL_CAPSULE | Freq: Three times a day (TID) | ORAL | 0 refills | Status: AC | PRN

## 2021-08-03 MED ORDER — MOLNUPIRAVIR EUA 200MG CAPSULE: 4.0000 | ORAL_CAPSULE | Freq: Two times a day (BID) | ORAL | 0 refills | Status: AC

## 2021-08-03 MED ORDER — SALINE SPRAY 0.65 % NA SOLN: 2.0000 | NASAL | 0 refills | Status: DC

## 2021-08-03 NOTE — Telephone Encounter (Signed)
HR Tonya notified NP patient tested positive for covid at home and to contact patient.  Covid test was positive today 08/03/21 per patient.  Patient will need to quarantine for another 5 days from test date unless symptoms started earlier.  Patient reported symptoms started 07/31/21 started not feeling right.  Worsened yesterday 10/25 sore throat, headache and not feeling good and [place] the beach notified her that couple they had dinner with on 07/29/21 tested positive for coivd.  Patient took home covid test this morning.  She would like antiviral treatment.  Discussed with [facility]avir in [facility] and if not requested her back up pharmacy preference and she stated [place], [place].  Day 6 estimated RTW 08/08/21  Day 10 12 Aug 2021 strict mask wear and no eating in employee lunch room.  Discussed ER precautions with patient e.g. if develops blue face/lips, trouble breathing, worsening chest pain, confusion or passing out proceed to ER/call 911.  Discussed patient to notify clinic staff if worsening cough/sinusitis symptoms  Discussed that nurse RN Hildred Alamin in clinic M, Tu, Th and Fr extension [phone] or I can be reached at [email or web address] email or [phone] when clinic closed e.g. Wednesdays, Saturdays, Sundays.  Patient to quarantine for 5 days and notify any of her close contacts in the previous 48 hours.  Patient reported she had not been at work in the previous week + because on vacation at the beach.  Estimated RTW 08/08/21 as long as symptom improved and no vomiting/diarrhea/fever previous 24 hours.  If patient lives with others/had contact since 10/24 I recommend they be tested also.  Try to stay in one room and use one bathroom and sanitize with lysol/chlorox/bleach high touch surfaces.  Emergency use authorization molnupiravir emailed to  [email or web address] patient preferred email.  Electronic Rx molnupiravir 200mg  sig take 800mg  (4 caps) po BID x 5 days  # 40 RF0  sent to her pharmacy of choice.  According to research patient should have 90 days immunity to reinfection but more research ongoing.  I still recommend wearing mask, maintaining social distance and washing hands frequently.  I recommend covid booster vaccination when eligible.  I recommend flu vaccination annually.    Patient to isolate in own room and if possible use only one bathroom if living with others in home.  Wear mask when out of room to help prevent spread to others in household.  Sanitize high touch surfaces with lysol/chlorox/bleach spray or wipes daily as viruses are known to live on surfaces from 24 hours to days.  Patient has flonase/nasal saline/motrin/tylenol OTC at home for cough/cold use. May use flonase nasal 29mcg 1 spray each nostril BID prn rhinitis.  Dayquil and nyquil per manufacturer instructions.  Patient requested refill on tessalon pearles 200mg  po TID prn cough #30 RF0 sent to her pharmacy of choice. Avoid dehydration and drink water to keep urine pale yellow clear and voiding every 2-4 hours while awake.  Patient alert and oriented x3, spoke full sentences without difficulty.  Some nasal congestion and froggy voice noted during 11 minute telephone call.  No audible cough/wheeze/shortness of breath.  Pt verbalized understanding and agreement with plan of care. No further questions/concerns at this time. Pt reminded to contact clinic with any changes in symptoms or questions/concerns. HR notified patient to work remote/quarantine through Day 5 10/30 and strict mask wear through Day 10 08/12/21 and no eating in employee lunch room.  Estimated return to work onsite  08/08/21  

## 2021-08-07 NOTE — Telephone Encounter (Signed)
Pt still with some mild nasal congestion. Denies any other sx and feels well enough to RTW onsite tomorrow, Day 6. Pt cleared to RTW 10/31 with strict mask use thru Day 10 11/4.

## 2021-08-08 NOTE — Telephone Encounter (Signed)
Agreed with plan of care and will follow up with patient tomorrow when in clinic.

## 2021-08-08 NOTE — Telephone Encounter (Signed)
Pt called out of work today citing increase productive cough, though at times also dry, hacky. Has albuterol inh at home from 11 months ago. Encouraged to use this and steam shower to help breathing and cough. Continuing Mucinex day and night formulations and tessalon pearles. Hopes to RTW tomorrow with mask use as long as sneezing or coughing.

## 2021-08-09 NOTE — Telephone Encounter (Signed)
Pt did RTW today 11/1 as expected. Cough still productive, white phlegm. Runny nose with clear mucus. Migraine started this morning but Goody powder has taken it down to a dull headache. Advised if sx not improving more Thursday, to contact clinic for NP appt. Pt agreeable.

## 2021-08-09 NOTE — Telephone Encounter (Signed)
Reviewed RN Haley note agreed with plan of care. 

## 2021-08-18 ENCOUNTER — Ambulatory Visit: Payer: Self-pay | Admitting: Registered Nurse

## 2021-08-18 ENCOUNTER — Other Ambulatory Visit: Payer: Self-pay

## 2021-08-18 VITALS — BP 109/77 | HR 67 | Temp 98.9°F

## 2021-08-18 DIAGNOSIS — K219 Gastro-esophageal reflux disease without esophagitis: Secondary | ICD-10-CM

## 2021-08-18 DIAGNOSIS — R1010 Upper abdominal pain, unspecified: Secondary | ICD-10-CM

## 2021-08-18 MED ORDER — OMEPRAZOLE 20 MG PO CPDR: 20.0000 mg | DELAYED_RELEASE_CAPSULE | Freq: Two times a day (BID) | ORAL | 0 refills | Status: DC

## 2021-08-18 NOTE — Progress Notes (Signed)
Subjective:    Patient ID: Diana Lutz, female    DOB: 1966/06/02, 55 y.o.   MRN: [id number]  55y/o caucasian female established patient with recurrent RUQ abdomen pain.  Has noticed epigastric pain with palpation more recently.  States pain sometimes triggered by movement e.g. bending over to pick something off ground and it will stop her in her tracks 10/10 pain.   Has to hold still until episode resolves. Sometimes she has noticed pain without activity after eating a meal. Typically does not have pain when eating. She has not been keeping a symptom log.  Has had a 50 lb weight loss over the past year.  Denied changes in caliber of stools/consistency.  States regular bowel movements/brown/formed.  Denied diarrhea/vomiting/bloody or black stools.  Denied left sided abdomen pain.  Denied pain with wearing seat belt or driving over speed bumps.  Patient has had labs with PCM and imaging.  Still indeterminate cause of pain.  Here for second opinion.  Was told fatty liver on [place] but all other tests normal.  Colonoscopy done last year polyps and diverticula.  Increased her fiber intake.  On omeprazole 60m for GERD.  Patient denied urinary or vaginal symptoms.  Postmenopausal.  Denied fever/chills/sweats.  Hepatitis test negative.  [place]dom[place] showed fatty liver but otherwise normal.  CT scan no concerns per patient also.    Review of Systems  Constitutional:  Positive for unexpected weight change. Negative for activity change, appetite change, chills, diaphoresis, fatigue and fever.  HENT:  Negative for trouble swallowing and voice change.   Eyes:  Negative for photophobia and visual disturbance.  Respiratory:  Negative for cough, shortness of breath, wheezing and stridor.   Cardiovascular:  Negative for chest pain.  Gastrointestinal:  Positive for abdominal pain. Negative for abdominal distention, anal bleeding, blood in stool, constipation, diarrhea, nausea, rectal pain and vomiting.   Endocrine: Negative for cold intolerance and heat intolerance.  Genitourinary:  Negative for difficulty urinating, menstrual problem, pelvic pain, vaginal bleeding, vaginal discharge and vaginal pain.  Musculoskeletal:  Positive for myalgias. Negative for arthralgias, back pain, gait problem, neck pain and neck stiffness.  Skin:  Negative for rash.  Allergic/Immunologic: Positive for environmental allergies. Negative for food allergies.  Neurological:  Negative for dizziness, tremors, seizures, syncope, facial asymmetry, speech difficulty, weakness, light-headedness, numbness and headaches.  Hematological:  Negative for adenopathy. Does not bruise/bleed easily.  Psychiatric/Behavioral:  Negative for agitation, confusion and sleep disturbance.       Objective:   Physical Exam Vitals and nursing note reviewed.  Constitutional:      General: She is awake. She is not in acute distress.    Appearance: Normal appearance. She is well-developed and well-groomed. She is obese. She is not ill-appearing, toxic-appearing or diaphoretic.  HENT:     Head: Normocephalic and atraumatic.     Jaw: There is normal jaw occlusion.     Salivary Glands: Right salivary gland is not diffusely enlarged. Left salivary gland is not diffusely enlarged.     Right Ear: Hearing and external ear normal.     Left Ear: Hearing and external ear normal.     Nose: Nose normal. No congestion or rhinorrhea.     Mouth/Throat:     Lips: Pink. No lesions.     Mouth: Mucous membranes are moist.     Pharynx: Oropharynx is clear.  Eyes:     General: Lids are normal. Vision grossly intact. Gaze aligned appropriately. Allergic shiner present. No scleral  icterus.       Right eye: No discharge.        Left eye: No discharge.     Extraocular Movements: Extraocular movements intact.     Conjunctiva/sclera: Conjunctivae normal.     Pupils: Pupils are equal, round, and reactive to light.  Neck:     Trachea: Trachea and phonation  normal. No tracheal deviation.  Cardiovascular:     Rate and Rhythm: Normal rate and regular rhythm.     Pulses: Normal pulses.          Radial pulses are 2+ on the right side and 2+ on the left side.  Pulmonary:     Effort: Pulmonary effort is normal. No respiratory distress.     Breath sounds: Normal breath sounds and air entry. No stridor or transmitted upper airway sounds. No wheezing.     Comments: Spoke full sentences without difficulty; no cough observed in exam room Abdominal:     General: Abdomen is flat. There is no distension.     Palpations: Abdomen is soft.     Tenderness: There is abdominal tenderness in the right upper quadrant and epigastric area. There is no guarding or rebound.    Musculoskeletal:        General: Normal range of motion.     Right elbow: No swelling, deformity, effusion or lacerations. Normal range of motion.     Left elbow: No swelling, deformity, effusion or lacerations. Normal range of motion.     Right hand: No swelling, deformity or lacerations. Normal range of motion. Normal strength.     Left hand: No swelling, deformity or lacerations. Normal range of motion. Normal strength.     Cervical back: Normal range of motion and neck supple. No swelling, edema, deformity, erythema, signs of trauma, lacerations, rigidity or crepitus. No pain with movement. Normal range of motion.     Thoracic back: No swelling, edema, deformity, signs of trauma or lacerations. Normal range of motion.     Lumbar back: No swelling, edema, deformity, signs of trauma or lacerations. Normal range of motion.  Lymphadenopathy:     Head:     Right side of head: No submandibular or preauricular adenopathy.     Left side of head: No submandibular or preauricular adenopathy.     Cervical:     Right cervical: No superficial cervical adenopathy.    Left cervical: No superficial cervical adenopathy.  Skin:    General: Skin is warm and dry.     Capillary Refill: Capillary refill  takes less than 2 seconds.     Coloration: Skin is not ashen, cyanotic, jaundiced, mottled, pale or sallow.     Findings: No abrasion, abscess, acne, bruising, burn, ecchymosis, erythema, signs of injury, laceration, lesion, petechiae, rash or wound.     Nails: There is no clubbing.  Neurological:     General: No focal deficit present.     Mental Status: She is alert and oriented to person, place, and time. Mental status is at baseline.     GCS: GCS eye subscore is 4. GCS verbal subscore is 5. GCS motor subscore is 6.     Cranial Nerves: Cranial nerves 2-12 are intact. No cranial nerve deficit, dysarthria or facial asymmetry.     Sensory: Sensation is intact.     Motor: Motor function is intact. No weakness, tremor, atrophy, abnormal muscle tone or seizure activity.     Coordination: Coordination is intact. Coordination normal.     Gait: Gait  is intact. Gait normal.     Comments: In/out of chair without difficulty; gait sure and steady in clinic; bilateral hand grasp equal 5/5  Psychiatric:        Attention and Perception: Attention and perception normal.        Mood and Affect: Mood and affect normal.        Speech: Speech normal.        Behavior: Behavior normal. Behavior is cooperative.        Thought Content: Thought content normal.        Cognition and Memory: Cognition and memory normal.        Judgment: Judgment normal.    Per chart review weight 257 lbs 03/15/20; 242 lbs on 06/29/2020 office visit/ 235 lbs on 03/01/21; 208 lbs on 07/21/21  05/21/2020 colonoscopy - Two diminutive polyps in the descending colon and in the transverse colon, removed with a cold snare. Resected and retrieved. - Diverticulosis in the sigmoid colon and in the descending colon. - The examination was otherwise normal on direct and retroflexion views.  Results for GWYN, HIERONYMUS (MRN [id number]) as of 08/18/2021 21:20   Ref. Range 05/26/2021 15:14  Appearance Unknown Pend  Bilirubin, UA Latest Ref  Range: negative  negative  Clarity, UA Latest Ref Range: clear  clear  Color, UA Latest Ref Range: yellow  yellow  Glucose Latest Ref Range: negative mg/dL negative  Ketones, UA Latest Ref Range: negative mg/dL negative  Leukocytes,UA Latest Ref Range: Negative  Negative  Nitrite, UA Latest Ref Range: Negative  Negative  pH, UA Latest Ref Range: 5.0 - 8.0  6.0  Protein,UA Latest Ref Range: Negative  Negative  Specific Gravity, UA Latest Ref Range: 1.005 - 1.030  1.030  Urobilinogen, UA Latest Ref Range: 0.2 or 1.0 E.U./dL 0.2  RBC, UA Latest Ref Range: negative  negative    Ref. Range 09/10/2018 09:01 06/29/2020 12:32  Hepatitis C Ab Latest Ref Range: NON-REACTI  NON-REACTIVE NON-REACTIVE  SIGNAL TO CUT-OFF Latest Ref Range: <1.00  0.02 0.01    Ref. Range 06/10/2021 11:03  Sodium Latest Ref Range: 134 - 144 mmol/L 142  Potassium Latest Ref Range: 3.5 - 5.2 mmol/L 4.6  Chloride Latest Ref Range: 96 - 106 mmol/L 107 (H)  CO2 Latest Ref Range: 20 - 29 mmol/L 20  Glucose Latest Ref Range: 65 - 99 mg/dL 108 (H)  BUN Latest Ref Range: 6 - 24 mg/dL 11  Creatinine Latest Ref Range: 0.57 - 1.00 mg/dL 0.74  Calcium Latest Ref Range: 8.7 - 10.2 mg/dL 9.3  BUN/Creatinine Ratio Latest Ref Range: 9 - 23  15  eGFR Latest Ref Range: >59 mL/min/1.73 95  Alkaline Phosphatase Latest Ref Range: 44 - 121 IU/L 123 (H)  Albumin Latest Ref Range: 3.8 - 4.9 g/dL 4.5  Albumin/Globulin Ratio Latest Ref Range: 1.2 - 2.2  2.1  Amylase, Serum Latest Ref Range: 31 - 110 U/L 42  Lipase Latest Ref Range: 14 - 72 U/L 26  AST Latest Ref Range: 0 - 40 IU/L 14  ALT Latest Ref Range: 0 - 32 IU/L 17  Total Protein Latest Ref Range: 6.0 - 8.5 g/dL 6.6  Total Bilirubin Latest Ref Range: 0.0 - 1.2 mg/dL 0.5    Ref. Range 03/01/2021 15:17 03/01/2021 15:47 06/01/2021 15:53 06/10/2021 11:03 08/01/2021 07:58  Glucose Latest Ref Range: 70 - 99 mg/dL  113 (H)  108 (H) 119 (H)  Hemoglobin A1C Latest Ref Range: 4.6 - 6.5 % 6.1 (A)  5.9      Ref. Range 12/08/2015 09:23 07/17/2016 10:15 03/01/2021 15:47 06/01/2021 15:53  Vitamin D, 25-Hydroxy Latest Ref Range: 30.0 - 100.0 ng/mL  20.7 (L)    VITD Latest Ref Range: 30.00 - 100.00 ng/mL 21.42 (L)  18.88 (L) 29.28 (L)    Ref. Range 08/01/2021 07:58  Sodium Latest Ref Range: 135 - 145 mEq/L 141  Potassium Latest Ref Range: 3.5 - 5.1 mEq/L 4.4  Chloride Latest Ref Range: 96 - 112 mEq/L 105  CO2 Latest Ref Range: 19 - 32 mEq/L 27  Glucose Latest Ref Range: 70 - 99 mg/dL 119 (H)  BUN Latest Ref Range: 6 - 23 mg/dL 13  Creatinine Latest Ref Range: 0.40 - 1.20 mg/dL 0.71  Calcium Latest Ref Range: 8.4 - 10.5 mg/dL 9.1  GFR Latest Ref Range: >60.00 mL/min 94.67     Ref. Range 03/15/2020 15:36 03/01/2021 15:47 06/10/2021 11:03  Alkaline Phosphatase Latest Ref Range: 44 - 121 IU/L 143 (H) 119 (H) 123 (H)    CLINICAL DATA:  Right upper quadrant pain.   EXAM: CT ABDOMEN WITH CONTRAST   TECHNIQUE: Multidetector CT imaging of the abdomen was performed using the standard protocol following bolus administration of intravenous contrast.   CONTRAST:  12m OMNIPAQUE IOHEXOL 350 MG/ML SOLN   COMPARISON:  Abdominal ultrasound 06/17/2021.   FINDINGS: Lower chest: Lung bases are clear. Heart size within normal limits. No pericardial or pleural effusion. Distal esophagus is grossly unremarkable.   Hepatobiliary: Liver is slightly decreased in attenuation diffusely. Liver and gallbladder are otherwise unremarkable. No biliary ductal dilatation.   Pancreas: Negative.   Spleen: Negative.   Adrenals/Urinary Tract: Adrenal glands are unremarkable. Millimetric low-attenuation lesions in the kidneys are too small to characterize and statistically, likely represent cysts. Kidneys are otherwise unremarkable.   Stomach/Bowel: Stomach and visualized portions of the small bowel, appendix and colon are unremarkable.   Vascular/Lymphatic: Vascular structures are unremarkable.  No pathologically enlarged lymph nodes.   Other: Partially imaged tiny umbilical hernia contains fat. Mesenteries and peritoneum are otherwise unremarkable. No free fluid.   Musculoskeletal: Degenerative changes in the spine. No worrisome lytic or sclerotic lesions.   IMPRESSION: 1. No findings to explain the patient's right upper quadrant pain. 2. Hepatic steatosis.     Electronically Signed   By: MLorin PicketM.D.   On: 08/03/2021 13:43  CLINICAL DATA:  Abdominal pain   EXAM: ULTRASOUND ABDOMEN LIMITED RIGHT UPPER QUADRANT   COMPARISON:  None.   FINDINGS: Gallbladder:   No gallstones or wall thickening visualized. No sonographic Murphy sign noted by sonographer.   Common bile duct:   Diameter: 3.9 mm   Liver:   Diffusely increased liver echogenicity. Portal vein is patent on color Doppler imaging with normal direction of blood flow towards the liver.   Other: None.   IMPRESSION: Diffusely increased liver echogenicity consistent with hepatic steatosis.     Electronically Signed   By: JMaurine SimmeringM.D.   On: 06/20/2021 11:40    Assessment & Plan:   A-recurrent RUQ abdomen pain subsequent visit, GERD  P-Reviewed up to date abdomen pain evaluation guidelines with patient.  She has received most of the recommended tests with her PCM (labs/imaging/colonoscopy).  Discussed colonoscopy diverticula could be having diverticulitis/flares but no diarrhea.  Discussed pain consistent with ulcer/gastritis.  Will trial increasing her dose omeprazole to 219mpo BID x 2 weeks or she may take it 4049maily.  Dispensed 90 tabs omeprazole 46m48m from [facility] to patient  today.  Keep symptom log regarding timing of food and pain or activity and pain.  Hepatitis C negative.  Pancreas enzymes normal.   Gallbladder normal on [place] and CT.  Discussed abdomen migraine symptoms. Type II diabetic could be gastroparesis.  LFTs normal except alkaline phosphatase and it is trending down over  the past year.  Patient given printed exitcare handouts on diverticulitis, gastritis, peptic ulcer, peptic ulcer eating plan.  Consider GI referral if no improvement with omeprazole trial or symptom log points to GI etiology.  Patient verbalized understanding information/instructions, agreed with plan of care and had no further questions at this time.

## 2021-08-18 NOTE — Patient Instructions (Signed)
Peptic Ulcer Eating Plan A peptic ulcer is a sore in the lining of the stomach (gastric ulcer) or the first part of the small intestine (duodenal ulcer). These sores are also called stomach ulcers. When ulcers develop, they can cause a burning feeling in the stomach as well as bloating, nausea, vomiting, and poor appetite. If you have a history of peptic ulcers, it is important to keep track of what foods and drinks cause symptoms. What are tips for following this plan?  Eat a healthy, well-balanced diet. This includes: Fresh fruits and vegetables. Eat a variety of colors of fruits and vegetables. Whole grains. Try to make sure at least half of the grains you eat each day are whole grains. Low-fat dairy. Lean meat, fish, poultry, eggs, beans, and nuts. Healthy fats, such as olive oil, grapeseed oil, or canola oil. Try to eat less than 8 teaspoons of fats and oils each day. Avoid foods that cause irritation or pain. These may be different for different people. Keep a food diary to identify foods that cause symptoms. Avoid processed foods that have added salt and sugar. Avoid drinking alcohol. Avoid drinks with caffeine, such as cola, black tea, energy drinks, and coffee. Recommended foods Grains Whole grains. Vegetables All fresh or frozen vegetables. Low-sodium canned vegetables. Fruits All fresh, frozen, or dried fruit. Fruit canned in juice. Meats and other protein foods Lean cuts of meat. Skinless poultry. Fresh or canned fish. Eggs. Tofu. Nuts and nut butter. Dried beans. Low-sodium canned beans. Dairy Low-fat or nonfat (skim) milk. Nonfat or low-fat yogurt. Nonfat or low-fat cheese. Beverages Water. Soy or nut milks. Caffeine-free soft drinks. Herbal tea. Fats and oils Olive oil. Canola oil. Grapeseed oil. Sunflower oil. Seasoning and other foods Low-fat salad dressing. Ketchup. Low-fat mayonnaise. All spices except pepper. Low-sodium seasoning mixes. The items listed above may  not be a complete list of foods and beverages you can eat. Contact a dietitian for more information. Foods to avoid Meats and other protein foods Fatty meats. Fried meats. Any meat that causes symptoms. Dairy Whole milk. Ice cream. Cream. Chocolate milk. Beverages Alcohol. Coffee. Cola and energy drinks. Black or green tea. Cocoa. Fats and oils Butter. Lard. Ghee. Seasoning and other foods Pepper. Hot sauce. Any seasonings or condiments that cause symptoms. The items listed above may not be a complete list of foods and beverages that you should avoid. Contact a dietitian for more information. Summary Peptic ulcers can cause burning in the stomach as well as bloating, nausea, vomiting, and poor appetite. You may be able to limit symptoms by avoiding foods that make you feel worse. Work with your dietitian or health care provider to identify foods that cause symptoms. This may include caffeinated drinks, alcohol, or pepper. This information is not intended to replace advice given to you by your health care provider. Make sure you discuss any questions you have with your health care provider. Document Revised: 05/06/2021 Document Reviewed: 05/06/2021 Elsevier Patient Education  2022 [place]. Peptic Ulcer A peptic ulcer is a painful sore in the lining of your stomach or the first part of your small intestine. What are the causes? Common causes of this condition include: An infection. Using certain pain medicines too often or too much. Rare tumors in the stomach, small intestine, or pancreas. What increases the risk? You are more likely to get this condition if you: Smoke. Have a family history of ulcer disease. Drink alcohol. Have been hospitalized in an intensive care unit (ICU). What  are the signs or symptoms? Symptoms include: Burning pain in the area between the chest and the belly button. The pain may: Not go away (be persistent). Be worse when your stomach is empty. Be  worse at night. Heartburn. Feeling sick to your stomach (nauseous) and throwing up (vomiting). Bloating. If the ulcer results in bleeding, it can cause you to: Have poop (stool) that is black and looks like tar. Throw up bright red blood. Throw up material that looks like coffee grounds. How is this treated? Treatment for this condition may include: Stopping things that can cause the ulcer, such as: Smoking. Using pain medicines. Drinking alcohol or caffeine. Medicines to reduce stomach acid. Antibiotic medicines if the ulcer is caused by an infection. A procedure that is done using a small, flexible tube that has a camera at the end (upper endoscopy). This may be done if you have a bleeding ulcer. Surgery. This may be needed if: You have a lot of bleeding. The ulcer caused a hole somewhere in the digestive system. Follow these instructions at home: Do not drink alcohol if your doctor tells you not to drink. Limit how much caffeine you take in. Do not smoke or use any products that contain nicotine or tobacco. If you need help quitting, ask your doctor. Take over-the-counter and prescription medicines only as told by your doctor. Do not stop or change your medicines unless you talk with your doctor about it first. Do not take aspirin, ibuprofen, or other NSAIDs unless your doctor told you to do so. Keep all follow-up visits. Contact a doctor if: You do not get better in 7 days after you start treatment. You keep having an upset stomach (indigestion) or heartburn. Get help right away if: You have sudden, sharp pain in your belly (abdomen). You have belly pain that does not go away. You have bloody poop (stool) or black, tarry poop. You throw up blood. It may look like coffee grounds. You feel light-headed or feel like you may pass out (faint). You get weak. You get sweaty or feel sticky and cold to the touch (clammy). These symptoms may be an emergency. Get help right away. Call  911. Do not wait to see if the symptoms will go away. Do not drive yourself to the hospital. Summary Symptoms of a peptic ulcer include burning pain in the area between the chest and the belly button. Do not smoke or use any products that contain nicotine or tobacco. If you need help quitting, ask your doctor. Take medicines only as told by your doctor. Limit how much alcohol and caffeine you have. Keep all follow-up visits. This information is not intended to replace advice given to you by your health care provider. Make sure you discuss any questions you have with your health care provider. Document Revised: 05/06/2021 Document Reviewed: 05/06/2021 Elsevier Patient Education  2022 [place]. Gastritis, Adult Gastritis is inflammation of the stomach. There are two kinds of gastritis: Acute gastritis. This kind develops suddenly. Chronic gastritis. This kind is much more common. It develops slowly and lasts for a long time. Gastritis happens when the lining of the stomach becomes weak or gets damaged. Without treatment, gastritis can lead to stomach bleeding and ulcers. What are the causes? This condition may be caused by: An infection. Drinking too much alcohol. Certain medicines. These include steroids, antibiotics, and some over-the-counter medicines, such as aspirin or ibuprofen. Having too much acid in the stomach. Having a disease of the stomach. Other causes  may include: An allergic reaction. Some cancer treatments (radiation). Smoking cigarettes or the use of products that contain nicotine or tobacco. In some cases, the cause of this condition is not known. What increases the risk? Having a disease of the intestines. Having a disease in which the body's immune system attacks the body (autoimmune disease), such as Crohn's disease. Using aspirin or ibuprofen and other NSAIDs to treat other conditions, such as heart disease or chronic pain. Stress. What are the signs or  symptoms? Symptoms of this condition include: Pain or a burning sensation in the upper abdomen. Nausea. Vomiting. An uncomfortable feeling of fullness after eating. Weight loss. Bad breath. Blood in your vomit or stool (feces). In some cases, there are no symptoms. How is this diagnosed? This condition may be diagnosed based on your medical history, a physical exam, and tests. Tests may include: Your medical history and a description of your symptoms. A physical exam. Tests. These can include: Blood tests. Stool tests. A test in which a thin, flexible instrument with a light and a camera is passed down the esophagus and into the stomach (upper endoscopy). A test in which a tissue sample is removed to look at it under a microscope (biopsy). How is this treated? This condition may be treated with medicines. The medicines that are used vary depending on the cause of the gastritis. If the condition is caused by a bacterial infection, you may be given antibiotic medicines. If the condition is caused by too much acid in the stomach, you may be given medicines called H2 blockers, proton pump inhibitors, or antacids. Treatment may also involve stopping the use of certain medicines such as aspirin or ibuprofen and other NSAIDs. Follow these instructions at home: Medicines Take over-the-counter and prescription medicines only as told by your health care provider. If you were prescribed an antibiotic medicine, take it as told by your health care provider. Do not stop taking the antibiotic even if you start to feel better. Alcohol use Do not drink alcohol if: Your health care provider tells you not to drink. You are pregnant, may be pregnant, or are planning to become pregnant. If you drink alcohol: Limit your use to: 0-1 drink a day for women. 0-2 drinks a day for men. Know how much alcohol is in your drink. In the [place] one drink equals one 12 oz bottle of beer (355 mL), one 5 oz glass of  wine (148 mL), or one 1 oz glass of hard liquor (44 mL). General instructions  Eat small, frequent meals instead of large meals. Avoid foods and drinks that make your symptoms worse. Talk with your health care provider about ways to manage stress, such as getting regular exercise or practicing deep breathing, meditation, or yoga. Do not use any products that contain nicotine or tobacco. These products include cigarettes, chewing tobacco, and vaping devices, such as e-cigarettes. If you need help quitting, ask your health care provider. Drink enough fluid to keep your urine pale yellow. Keep all follow-up visits. This is important. Contact a health care provider if: Your symptoms get worse. Your abdominal pain gets worse. Your symptoms return after treatment. You have a fever. Get help right away if: You vomit blood or a substance that looks like coffee grounds. You have black or dark red stools. You are unable to keep fluids down. These symptoms may represent a serious problem that is an emergency. Do not wait to see if the symptoms will go away. Get medical help  right away. Call your local emergency services (911 in the [place] Do not drive yourself to the hospital. Summary Gastritis is inflammation of the lining of the stomach that can occur suddenly (acute) or develop slowly over time (chronic). This condition is diagnosed with a medical history, a physical exam, or tests. This condition may be treated with medicines to treat infection or medicines to reduce the amount of acid in your stomach. Follow your health care provider's instructions about taking medicines, making changes to your diet, and knowing when to call for help. This information is not intended to replace advice given to you by your health care provider. Make sure you discuss any questions you have with your health care provider. Document Revised: 01/29/2021 Document Reviewed: 01/29/2021 Elsevier Patient Education  [place]. Diverticulitis Diverticulitis is infection or inflammation of small pouches (diverticula) in the colon that form due to a condition called diverticulosis. Diverticula can trap stool (feces) and bacteria, causing infection and inflammation. Diverticulitis may cause severe stomach pain and diarrhea. It may lead to tissue damage in the colon that causes bleeding or blockage. The diverticula may also burst (rupture) and cause infected stool to enter other areas of the abdomen. What are the causes? This condition is caused by stool becoming trapped in the diverticula, which allows bacteria to grow in the diverticula. This leads to inflammation and infection. What increases the risk? You are more likely to develop this condition if you have diverticulosis. The risk increases if you: Are overweight or obese. Do not get enough exercise. Drink alcohol. Use tobacco products. Eat a diet that has a lot of red meat such as beef, pork, or lamb. Eat a diet that does not include enough fiber. High-fiber foods include fruits, vegetables, beans, nuts, and whole grains. Are over 60 years of age. What are the signs or symptoms? Symptoms of this condition may include: Pain and tenderness in the abdomen. The pain is normally located on the left side of the abdomen, but it may occur in other areas. Fever and chills. Nausea. Vomiting. Cramping. Bloating. Changes in bowel routines. Blood in your stool. How is this diagnosed? This condition is diagnosed based on: Your medical history. A physical exam. Tests to make sure there is nothing else causing your condition. These tests may include: Blood tests. Urine tests. CT scan of the abdomen. How is this treated? Most cases of this condition are mild and can be treated at home. Treatment may include: Taking over-the-counter pain medicines. Following a clear liquid diet. Taking antibiotic medicines by mouth. Resting. More severe cases may need  to be treated at a hospital. Treatment may include: Not eating or drinking. Taking prescription pain medicine. Receiving antibiotic medicines through an IV. Receiving fluids and nutrition through an IV. Surgery. When your condition is under control, your health care provider may recommend that you have a colonoscopy. This is an exam to look at the entire large intestine. During the exam, a lubricated, bendable tube is inserted into the anus and then passed into the rectum, colon, and other parts of the large intestine. A colonoscopy can show how severe your diverticula are and whether something else may be causing your symptoms. Follow these instructions at home: Medicines Take over-the-counter and prescription medicines only as told by your health care provider. These include fiber supplements, probiotics, and stool softeners. If you were prescribed an antibiotic medicine, take it as told by your health care provider. Do not stop taking the antibiotic even if  you start to feel better. Ask your health care provider if the medicine prescribed to you requires you to avoid driving or using machinery. Eating and drinking  Follow a full liquid diet or another diet as directed by your health care provider. After your symptoms improve, your health care provider may tell you to change your diet. He or she may recommend that you eat a diet that contains at least 25 grams (25 g) of fiber daily. Fiber makes it easier to pass stool. Healthy sources of fiber include: Berries. One cup contains 4-8 grams of fiber. Beans or lentils. One-half cup contains 5-8 grams of fiber. Green vegetables. One cup contains 4 grams of fiber. Avoid eating red meat. General instructions Do not use any products that contain nicotine or tobacco, such as cigarettes, e-cigarettes, and chewing tobacco. If you need help quitting, ask your health care provider. Exercise for at least 30 minutes, 3 times each week. You should exercise  hard enough to raise your heart rate and break a sweat. Keep all follow-up visits as told by your health care provider. This is important. You may need to have a colonoscopy. Contact a health care provider if: Your pain does not improve. Your bowel movements do not return to normal. Get help right away if: Your pain gets worse. Your symptoms do not get better with treatment. Your symptoms suddenly get worse. You have a fever. You vomit more than one time. You have stools that are bloody, black, or tarry. Summary Diverticulitis is infection or inflammation of small pouches (diverticula) in the colon that form due to a condition called diverticulosis. Diverticula can trap stool (feces) and bacteria, causing infection and inflammation. You are at higher risk for this condition if you have diverticulosis and you eat a diet that does not include enough fiber. Most cases of this condition are mild and can be treated at home. More severe cases may need to be treated at a hospital. When your condition is under control, your health care provider may recommend that you have an exam called a colonoscopy. This exam can show how severe your diverticula are and whether something else may be causing your symptoms. Keep all follow-up visits as told by your health care provider. This is important. This information is not intended to replace advice given to you by your health care provider. Make sure you discuss any questions you have with your health care provider. Document Revised: 07/07/2019 Document Reviewed: 07/07/2019 Elsevier Patient Education  2022 [employer].

## 2021-08-20 NOTE — Telephone Encounter (Signed)
Patient seen in clinic 08/18/21 URI resolved but still having abdomen pain intermittent.  See office note.

## 2021-08-29 ENCOUNTER — Other Ambulatory Visit: Payer: Self-pay

## 2021-08-29 ENCOUNTER — Ambulatory Visit: Payer: Self-pay | Admitting: *Deleted

## 2021-08-29 DIAGNOSIS — Z0000 Encounter for general adult medical examination without abnormal findings: Secondary | ICD-10-CM

## 2021-08-29 NOTE — Progress Notes (Signed)
Fasting labs ahead of pcp CPE 09/06/21.

## 2021-08-30 ENCOUNTER — Encounter: Payer: Self-pay | Admitting: Registered Nurse

## 2021-08-30 DIAGNOSIS — R748 Abnormal levels of other serum enzymes: Secondary | ICD-10-CM

## 2021-08-30 LAB — CMP12+LP+TP+TSH+6AC+CBC/D/PLT
ALT: 13 IU/L (ref 0–32)
AST: 13 IU/L (ref 0–40)
Albumin/Globulin Ratio: 2.2 (ref 1.2–2.2)
Albumin: 4.6 g/dL (ref 3.8–4.9)
Alkaline Phosphatase: 136 IU/L — ABNORMAL HIGH (ref 44–121)
BUN/Creatinine Ratio: 17 (ref 9–23)
BUN: 13 mg/dL (ref 6–24)
Basophils Absolute: 0.1 10*3/uL (ref 0.0–0.2)
Basos: 1 %
Bilirubin Total: 0.4 mg/dL (ref 0.0–1.2)
Calcium: 9.5 mg/dL (ref 8.7–10.2)
Chloride: 103 mmol/L (ref 96–106)
Chol/HDL Ratio: 3.3 ratio (ref 0.0–4.4)
Cholesterol, Total: 192 mg/dL (ref 100–199)
Creatinine, Ser: 0.77 mg/dL (ref 0.57–1.00)
EOS (ABSOLUTE): 0.1 10*3/uL (ref 0.0–0.4)
Eos: 2 %
Estimated CHD Risk: <0.5 times avg. (ref 0.0–1.0)
Free Thyroxine Index: 2.5 (ref 1.2–4.9)
GGT: 14 IU/L (ref 0–60)
Globulin, Total: 2.1 g/dL (ref 1.5–4.5)
Glucose: 92 mg/dL (ref 70–99)
HDL: 59 mg/dL (ref >39)
Hematocrit: 42.3 % (ref 34.0–46.6)
Hemoglobin: 14.2 g/dL (ref 11.1–15.9)
Immature Grans (Abs): 0 10*3/uL (ref 0.0–0.1)
Immature Granulocytes: 1 %
Iron: 68 ug/dL (ref 27–159)
LDH: 203 IU/L (ref 119–226)
LDL Chol Calc (NIH): 118 mg/dL — ABNORMAL HIGH (ref 0–99)
Lymphocytes Absolute: 1.9 10*3/uL (ref 0.7–3.1)
Lymphs: 31 %
MCH: 29.3 pg (ref 26.6–33.0)
MCHC: 33.6 g/dL (ref 31.5–35.7)
MCV: 87 fL (ref 79–97)
Monocytes Absolute: 0.5 10*3/uL (ref 0.1–0.9)
Monocytes: 8 %
Neutrophils Absolute: 3.4 10*3/uL (ref 1.4–7.0)
Neutrophils: 57 %
Phosphorus: 3.7 mg/dL (ref 3.0–4.3)
Platelets: 198 10*3/uL (ref 150–450)
Potassium: 4.6 mmol/L (ref 3.5–5.2)
RBC: 4.85 x10E6/uL (ref 3.77–5.28)
RDW: 13.5 % (ref 11.7–15.4)
Sodium: 139 mmol/L (ref 134–144)
T3 Uptake Ratio: 27 % (ref 24–39)
T4, Total: 9.3 ug/dL (ref 4.5–12.0)
TSH: 1.09 u[IU]/mL (ref 0.450–4.500)
Total Protein: 6.7 g/dL (ref 6.0–8.5)
Triglycerides: 82 mg/dL (ref 0–149)
Uric Acid: 4.3 mg/dL (ref 3.0–7.2)
VLDL Cholesterol Cal: 15 mg/dL (ref 5–40)
WBC: 6 10*3/uL (ref 3.4–10.8)
eGFR: 90 mL/min/{1.73_m2} (ref >59)

## 2021-08-30 LAB — HGB A1C W/O EAG: Hgb A1c MFr Bld: 5.6 % (ref 4.8–5.6)

## 2021-09-03 ENCOUNTER — Other Ambulatory Visit: Payer: Self-pay | Admitting: Primary Care

## 2021-09-03 DIAGNOSIS — I10 Essential (primary) hypertension: Secondary | ICD-10-CM

## 2021-09-03 DIAGNOSIS — E119 Type 2 diabetes mellitus without complications: Secondary | ICD-10-CM

## 2021-09-06 ENCOUNTER — Other Ambulatory Visit: Payer: Self-pay

## 2021-09-06 ENCOUNTER — Ambulatory Visit (INDEPENDENT_AMBULATORY_CARE_PROVIDER_SITE_OTHER): Payer: No Typology Code available for payment source | Admitting: Primary Care

## 2021-09-06 ENCOUNTER — Encounter: Payer: Self-pay | Admitting: Primary Care

## 2021-09-06 VITALS — BP 110/68 | HR 72 | Temp 98.5°F | Ht 67.0 in | Wt 215.0 lb

## 2021-09-06 DIAGNOSIS — E119 Type 2 diabetes mellitus without complications: Secondary | ICD-10-CM

## 2021-09-06 DIAGNOSIS — I10 Essential (primary) hypertension: Secondary | ICD-10-CM | POA: Diagnosis not present

## 2021-09-06 DIAGNOSIS — Z0000 Encounter for general adult medical examination without abnormal findings: Secondary | ICD-10-CM

## 2021-09-06 DIAGNOSIS — G4733 Obstructive sleep apnea (adult) (pediatric): Secondary | ICD-10-CM

## 2021-09-06 DIAGNOSIS — K219 Gastro-esophageal reflux disease without esophagitis: Secondary | ICD-10-CM | POA: Diagnosis not present

## 2021-09-06 DIAGNOSIS — F4323 Adjustment disorder with mixed anxiety and depressed mood: Secondary | ICD-10-CM

## 2021-09-06 DIAGNOSIS — E785 Hyperlipidemia, unspecified: Secondary | ICD-10-CM | POA: Diagnosis not present

## 2021-09-06 DIAGNOSIS — R1011 Right upper quadrant pain: Secondary | ICD-10-CM

## 2021-09-06 MED ORDER — ROSUVASTATIN CALCIUM 5 MG PO TABS: 5.0000 mg | ORAL_TABLET | Freq: Every evening | ORAL | 3 refills | Status: DC

## 2021-09-06 MED ORDER — OMEPRAZOLE 20 MG PO CPDR: 20.0000 mg | DELAYED_RELEASE_CAPSULE | Freq: Every day | ORAL | 3 refills | Status: DC

## 2021-09-06 MED ORDER — AMLODIPINE BESYLATE 10 MG PO TABS: 10.0000 mg | ORAL_TABLET | Freq: Every day | ORAL | 3 refills | Status: DC

## 2021-09-06 NOTE — Assessment & Plan Note (Signed)
Controlled in the office today on amlodipine 10 mg and off losartan 100 mg  Continue amlodipine 10 mg, will continue to hold losartan 100 mg daily.  She will monitor her blood pressure and report if readings are at or above 130/90.  Recent CMP reviewed from labs done in employer.

## 2021-09-06 NOTE — Assessment & Plan Note (Signed)
Doing well omeprazole 20 mg daily, continue same.

## 2021-09-06 NOTE — Assessment & Plan Note (Signed)
Chronic and continued, all work-up thus far has been negative.  Order placed for HIDA scan. Referral placed to GI for evaluation.

## 2021-09-06 NOTE — Assessment & Plan Note (Signed)
Continues to deny concerns of depression and anxiety. Continue to monitor.

## 2021-09-06 NOTE — Progress Notes (Signed)
Subjective:    Patient ID: Diana Lutz, female    DOB: May 27, 1966, 55 y.o.   MRN: [id number]  HPI  Diana Lutz is a very pleasant 55 y.o. female who presents today for complete physical and follow up of chronic conditions.  She has been out of losartan 100 mg for 10 days. She denies chest pain, dizziness, headaches, blurred vision.  She has had her blood pressure checked a few times over the last few weeks which has been "normal".  She is compliant to her amlodipine 10 mg daily.  She continues to notice her RUQ abdominal pain with pain to her right lower thoracic back. She's undergone chest x-rays, abdominal ultrasound, abdominal CT scan without cause for symptoms.  Her pain is about the same, waxes and wanes.  Immunizations: -Tetanus: 2019 -Influenza: Completed in September 2022 -Covid-19: 2 vaccines -Shingles: Declines today  Diet: Fair diet.  Exercise: No regular exercise, does walk daily  Eye exam: No recent exam, will schedule  Dental exam: Completes semi-annually   Pap Smear: Completed 3+ years ago, follows with GYN and will schedule.  Mammogram: Completed in 2021 at work, will complete in December 2022.  Colonoscopy: Completed in 2021, due 2028  BP Readings from Last 3 Encounters:  09/06/21 110/68  08/18/21 109/77  07/21/21 110/72       Review of Systems  Constitutional:  Negative for unexpected weight change.  HENT:  Negative for rhinorrhea.   Eyes:  Negative for visual disturbance.  Respiratory:  Negative for cough and shortness of breath.   Cardiovascular:  Negative for chest pain.  Gastrointestinal:  Positive for abdominal pain. Negative for constipation and diarrhea.  Genitourinary:  Negative for difficulty urinating.  Musculoskeletal:  Negative for arthralgias.  Skin:  Negative for rash.  Allergic/Immunologic: Negative for environmental allergies.  Neurological:  Negative for dizziness, numbness and headaches.  Psychiatric/Behavioral:   The patient is not nervous/anxious.         Past Medical History:  Diagnosis Date   Abnormal Pap smear of cervix 1991   Anemia    history of anemia   Diabetes mellitus without complication (HCC)    Dysplasia of cervix    cervical ca   GERD (gastroesophageal reflux disease)    Hx of adenomatous polyp of colon 05/30/2020   Hypertension    Migraines     Social History   Socioeconomic History   Marital status: Married    Spouse name: Not on file   Number of children: Not on file   Years of education: Not on file   Highest education level: Not on file  Occupational History   Not on file  Tobacco Use   Smoking status: Never   Smokeless tobacco: Never  Vaping Use   Vaping Use: Never used  Substance and Sexual Activity   Alcohol use: Not Currently    Alcohol/week: 0.0 standard drinks    Comment: social   Drug use: No   Sexual activity: Not Currently  Other Topics Concern   Not on file  Social History Narrative   Married.   3 children   Works for [employer] as Engineer, manufacturing systems.   Enjoys four wheeling, working on the house   Social Determinants of Health   Financial Resource Strain: Not on file  Food Insecurity: Not on file  Transportation Needs: Not on file  Physical Activity: Not on file  Stress: Not on file  Social Connections: Not on file  Intimate Partner Violence: Not on file  Past Surgical History:  Procedure Laterality Date   ANTERIOR AND POSTERIOR REPAIR N/A 08/18/2015   Procedure: ANTERIOR (CYSTOCELE) AND POSTERIOR REPAIR (RECTOCELE)/Perineorrhaphy;  Surgeon: Azucena Fallen, MD;  Location: [facility];  Service: Gynecology;  Laterality: N/A;  2 hrs.   BLADDER SUSPENSION N/A 08/18/2015   Procedure: TRANSVAGINAL TAPE (TVT) Mid-Urethral Sling PROCEDURE;  Surgeon: Azucena Fallen, MD;  Location: [facility];  Service: Gynecology;  Laterality: N/A;   COLONOSCOPY  11/15/2005   normal-Dr.Gessner   CYSTOSCOPY N/A 08/18/2015   Procedure: CYSTOSCOPY;  Surgeon: Azucena Fallen, MD;  Location: [facility];  Service: Gynecology;  Laterality: N/A;   HYSTEROSCOPY W/ ENDOMETRIAL ABLATION     LASER ABLATION OF THE CERVIX     WISDOM TOOTH EXTRACTION      Family History  Problem Relation Age of Onset   Hypertension Father    Stroke Father    Heart attack Father    Prostate cancer Father    Bladder Cancer Father    Hypertension Mother    Diverticulosis Mother    Colon polyps Mother    Hypertension Brother    Diabetes Brother    Colon cancer Maternal Grandmother 49   Esophageal cancer Neg Hx    Rectal cancer Neg Hx    Stomach cancer Neg Hx     No Known Allergies  Current Outpatient Medications on File Prior to Visit  Medication Sig Dispense Refill   amLODipine (NORVASC) 10 MG tablet TAKE 1 TABLET BY MOUTH EVERY DAY FOR BLOOD PRESSURE 90 tablet 0   calcium carbonate (TUMS - DOSED IN MG ELEMENTAL CALCIUM) 500 MG chewable tablet Chew 2 tablets by mouth daily as needed for indigestion or heartburn.     [facility] LUBRICANT EYE DROPS 0.4-0.3 % SOLN APPLY 1 DROP TO EYE 3 (THREE) TIMES DAILY.  12   cyclobenzaprine (FLEXERIL) 5 MG tablet Take 1 tablet (5 mg total) by mouth 3 (three) times daily as needed for muscle spasms. 15 tablet 0   fluticasone (FLONASE) 50 MCG/ACT nasal spray Place 1 spray into both nostrils 2 (two) times daily. 16 g 6   ibuprofen (ADVIL,MOTRIN) 600 MG tablet Take 600 mg by mouth daily as needed.     metFORMIN (GLUCOPHAGE-XR) 500 MG 24 hr tablet TAKE 1 TABLET (500 MG TOTAL) BY MOUTH DAILY WITH BREAKFAST. FOR DIABETES. 90 tablet 0   omeprazole (PRILOSEC) 20 MG capsule Take 1 capsule (20 mg total) by mouth daily. For heartburn. 90 capsule 1   sodium chloride (OCEAN) 0.65 % SOLN nasal spray Place 2 sprays into both nostrils every 2 (two) hours while awake.  0   ciclopirox (LOPROX) 0.77 % cream Apply topically 2 (two) times daily. (Patient not taking: Reported on 09/06/2021) 90 g 0   diclofenac (VOLTAREN) 50 MG EC tablet Take 1 tablet (50 mg total) by mouth  2 (two) times daily. (Patient not taking: Reported on 09/06/2021) 10 tablet 0   losartan (COZAAR) 100 MG tablet TAKE 1 TABLET BY MOUTH EVERY DAY FOR BLOOD PRESSURE (Patient not taking: Reported on 09/06/2021) 90 tablet 0   ondansetron (ZOFRAN) 4 MG tablet Take 1 tablet (4 mg total) by mouth every 6 (six) hours. (Patient not taking: Reported on 09/06/2021) 12 tablet 0   rosuvastatin (CRESTOR) 5 MG tablet Take 1 tablet (5 mg total) by mouth every evening. For cholesterol. (Patient not taking: Reported on 09/06/2021) 90 tablet 3   No current facility-administered medications on file prior to visit.    BP 110/68   Pulse 72  Temp 98.5 F (36.9 C) (Temporal)   Ht 5\' 7"  (1.702 m)   Wt 215 lb (97.5 kg)   SpO2 97%   BMI 33.67 kg/m  Objective:   Physical Exam HENT:     Right Ear: Tympanic membrane and ear canal normal.     Left Ear: Tympanic membrane and ear canal normal.     Nose: Nose normal.  Eyes:     Conjunctiva/sclera: Conjunctivae normal.     Pupils: Pupils are equal, round, and reactive to light.  Neck:     Thyroid: No thyromegaly.  Cardiovascular:     Rate and Rhythm: Normal rate and regular rhythm.     Heart sounds: No murmur heard. Pulmonary:     Effort: Pulmonary effort is normal.     Breath sounds: Normal breath sounds. No rales.  Abdominal:     General: Bowel sounds are normal.     Palpations: Abdomen is soft.     Tenderness: There is no abdominal tenderness.  Musculoskeletal:        General: Normal range of motion.     Cervical back: Neck supple.  Lymphadenopathy:     Cervical: No cervical adenopathy.  Skin:    General: Skin is warm and dry.     Findings: No rash.  Neurological:     Mental Status: She is alert and oriented to person, place, and time.     Cranial Nerves: No cranial nerve deficit.     Deep Tendon Reflexes: Reflexes are normal and symmetric.  Psychiatric:        Mood and Affect: Mood normal.          Assessment & Plan:      This visit  occurred during the SARS-CoV-2 public health emergency.  Safety protocols were in place, including screening questions prior to the visit, additional usage of staff PPE, and extensive cleaning of exam room while observing appropriate contact time as indicated for disinfecting solutions.

## 2021-09-06 NOTE — Patient Instructions (Addendum)
Hold your metformin for 1-2 weeks to see if this helps with your abdominal pain.  Complete your mammogram in December 2022.  You will be contacted regarding your HIDA scan and also for the GI referral.  Please let us know if you have not been contacted within two weeks.   Complete your pap smear as discussed.   Monitor your blood pressure off of the losartan. It should run less than 130 on top and less than 90 on bottom.  It was a pleasure to see you today!

## 2021-09-06 NOTE — Assessment & Plan Note (Signed)
Influenza vaccine up-to-date.  Declines Shingrix. Pap smear overdue, she will schedule with her GYN. Colonoscopy up-to-date, due 2028. Mammogram due, she completes this annually at work.  She will have results faxed to our office.  Discussed the importance of a healthy diet and regular exercise in order for weight loss, and to reduce the risk of further co-morbidity.  Exam today stable. Labs reviewed from employer via epic.

## 2021-09-06 NOTE — Assessment & Plan Note (Signed)
Compliant to CPAP nightly, continue same.  

## 2021-09-06 NOTE — Assessment & Plan Note (Signed)
Recent A1c of 5.6!  Commended her on healthy lifestyle changes.  We will have her hold her metformin to see if this helps with abdominal pain.  She will update in 1 to 2 weeks.  She will schedule an eye exam. Managed on statin.   Holding losartan for now, may need urine microalbumin next visit.  Foot exam up-to-date.  Follow-up in 6 months

## 2021-09-08 ENCOUNTER — Other Ambulatory Visit: Payer: Self-pay | Admitting: Registered Nurse

## 2021-09-13 ENCOUNTER — Telehealth: Payer: Self-pay | Admitting: Registered Nurse

## 2021-09-13 ENCOUNTER — Encounter: Payer: Self-pay | Admitting: Registered Nurse

## 2021-09-13 DIAGNOSIS — Z20822 Contact with and (suspected) exposure to covid-19: Secondary | ICD-10-CM

## 2021-09-13 NOTE — Telephone Encounter (Signed)
Patient identified as close contact of daughter who tested positive for covid today and first day of daughter symptoms today also.  Was together extended period greater than 15 minutes ate lunch together today without masks.  Patient also notified that another adult child who was at party with her this weekend tested positive for covid today also.  HR  taskforce team and patient supervisor notified patient to covid test in am does not have covid test at home tonight.  Patient instructed can show insurance card at [facility] or [employer] and obtain up to 8 tests per month with her [employer].  Patient plans to get test in am.  Discussed with patient if negative retest on Sunday 09/18/21  Day 5.   If test negative and asymptomatic may return to work with mask 09/14/21.  Discussed do not remove mask at work but may use straw under mask to drink fluids if needed while at work.  Eating should be done in private room/outside/in car or at home.   If symptoms develop she is to contact me at [phone] and stay home.  Patient to wear mask when around others/isolate and monitor for symptoms for 10 days through 09/23/21 and no eating in employee lunch room.  Discussed may eat in room across from clinic if too cold to eat outside/in car.  Reviewed possible Covid symptoms including cough, shortness of breath with exertion or at rest, runny nose, congestion, sinus pain/pressure, sore throat, fever/chills, body aches, fatigue, loss of taste/smell, GI symptoms of nausea/vomiting/diarrhea. Discussed with patient some close contacts develop no symptoms with positive test others congestion/allergy or cold symptoms or worse with omicron.  Symptoms are variable.  Patient to isolate in own room and if possible use only one bathroom if living with others in home.  Wear mask when out of room to help prevent spread to others in household.  Sanitize high touch surfaces with lysol/chlorox/bleach spray or wipes  daily as viruses are known to live on surfaces from 24 hours to days.  Patient alert and oriented x3, spoke full sentences without difficulty.  No audible nasal congestion, throat clearing or cough during 6 minute telephone call.  Discussed RN Hildred Alamin may be calling her later this week to check in.   Pt verbalized understanding and agreement with plan of care. No further questions/concerns at this time. Exitcare handout on covid 19 quarantine and isolation sent to my chart.  Pt reminded to contact clinic with any changes in symptoms or questions/concerns.

## 2021-09-14 NOTE — Telephone Encounter (Signed)
Patient contacted NP this am via telephone covid test negative but woke up with headache and dry throat this morning.  Dry throat and mouth same as her daughter had yesterday prior to positive covid test.  Patient with multiple family covid exposures in the past 3 days at family event.  Instructed patient to stay home today, wear mask when around others/isolate and retest tomorrow am.  Patient reported headache and dry throat not uncommon for her especially with weather changes/rain overnight.  Typically takes tylenol or motrin for headache.  Does not drink caffeine daily.  Denied worst headache of life. Patient does want covid antivirals if test comes back positive tomorrow.  Melonie Penn and HR team notified patient instructed to stay home today and isolate and retest tomorrow am due to new symptoms overnight despite negative covid test this am.  Discussed with patient typically if covid test negative and asymptomatic can return to work with mask strict wear and no eating in employee lunchroom and monitor for covid symptoms x 10 days but since new symptoms overnight/multiple exposures in previous 3 days will stay home today and retest tomorrow/monitor for new symptoms.  She should isolate from others in her household.  [facility] stated [facility] insurance card and charge test to insurance so she had to pay for it.  Patient sent reimbursement link to her [email or web address] email per her request.  Patient A&Ox3 spoke full sentences without difficulty no audible cough/congestion/throat clearing noted during 11 minute telephone call.  Patient verbalized understanding information/instructions, agreed with plan of care and had no further questions at this time.  Email sent to patient  For COVID tests, members are allowed 8 tests per month/per member, and these are covered upfront at [employer], [employer] (including [place]), [employer], [employer] (including [place] [employer]) or [employer].  The  member would only need to present their ID card and ask to have the OTC at-home COVID-19 test kits submitted to their plan for coverage.       Members can also purchase an OTC at-home COVID-19 test kit at other stores or online retailers.   If they choose one of these options, they would need to keep their purchase receipt(s) to submit for reimbursement.   The member's plan will reimburse up to $12 per test.   [employer] advised that the member will receive reimbursement more quickly if they submit an online claim form.     Here is a link to [place] COVID-19 information:  [email or web address]     See link below in Tim's email if you need to submit claim for reimbursement for covid tests purchased at places other than [employer] or [place] with your insurance card.  The free covid test by mail program has ended via USPS 2 Sep.  Some libraries and post offices do have free covid tests in the area as part of the government program also.  Sincerely,  Gerarda Fraction NP-C  [phone], ext. [phone] Fax:  [phone]  [email or web address]  [email or web address]     Office Hours:  Tuesday 09-12  Thursday 11-2   telephone follow ups Wednesday, Saturday, Sunday [phone]  From: Hyacinth Meeker [email or web address]>  Sent: Wednesday, November 03, 2020 6:15 PM To: Maryruth Eve [email or web address]> Subject: Plan coverage of at-home COVID-19 test kits        Hello health plan participants,     As a result of Biden Administration guidance, effective October 23, 2020, our plan now covers certain FDA-authorized at-home COVID-19 test kits.  A limited number of these tests are available at no cost to you if bought at [employer], [employer], or [employer]. If you buy covered test kits elsewhere, the plan will reimburse you up to $12 per test, subject to a monthly cap per covered member.     Here are the details:     Each covered member (you,  your covered spouse/domestic partner, and each covered dependent) can buy or be reimbursed for up to 8 COVID-19 at-home tests per calendar month. No prescription is necessary.    Note: A no-cost mail order option will become available in February 2022. We will update you. Only the following COVID-19 test kits are covered:      To buy authorized COVID-19 test kits at [place], [employer], and [employer] at no cost to you:    Go to [employer]. Present your [employer] member ID card. Ask to have the sale of the test(s) submitted to [facility]. You will not need to submit a reimbursement form.    If you buy authorized COVID-19 test kits from a pharmacy or retailer other than [place], [place] [facility]:    You must submit a claim form and a copy of the receipt to be reimbursed. Reimbursement is capped at $12 per test, up to 8 per covered member per calendar month.    The costs of these tests will not apply to member deductibles.    Learn more at our pharmacy plan provider's FAQ page.    For additional help:    Call [employer] Member Services at [phone]. Have your [employer] member ID card on hand. Email [email or web address]. Their hours are 7 AM to 8 PM CT.    As a reminder, every home in the [place] is also eligible to receive 4 free at-home COVID-19 tests by ordering online at [email or web address]. It couldn't be easier.    We hope this information is helpful to you. Frequent at-home testing is a great way to reduce the spread of COVID-19.     Your HR & Benefits Teams

## 2021-09-14 NOTE — Telephone Encounter (Signed)
Per Epic review patient saw [facility] 09/06/21 and GI referral and order for HIDA scan placed by PCM.

## 2021-09-15 NOTE — Telephone Encounter (Signed)
Pt by clinic this morning. Reported she remains asymptomatic and had a negative test this morning prior to coming to work. Pt denies any current needs or concerns and will continue to monitor for sx and use strict mask wear including not eating in employee break room.

## 2021-09-15 NOTE — Telephone Encounter (Signed)
Reviewed RN Hildred Alamin note, covid test negative asymptomatic continue mask wear and monitoring for symptoms through day 10.  Agreed with plan of care.

## 2021-09-18 NOTE — Telephone Encounter (Signed)
Patient returned call; covid test today negative and still asymptomatic feeling well.  Discussed continue mask wear and monitoring for symptoms through 09/23/21 and no eating in employee lunch room.  [facility]iterated [facility] not a [facility] that accepts insurance card for covid tests but [employer], [facility] do.  Discussed can submit receipt for reimbursement to link emailed her with medcost.  Patient A&Ox3 spoke full sentences without difficulty; no cough/congestion/throat clearing audible during 4 minute telephone call.  HR notified covid test negative continue mask wear through 09/23/21.  Patient verbalized understanding information/instructions, agreed with plan of care and had no further questions at this time.

## 2021-09-23 NOTE — Telephone Encounter (Signed)
Attempted to contact pt to confirm still asymptomatic and close encounter since today Day 10 from exposure. No answer. LVM.

## 2021-09-23 NOTE — Telephone Encounter (Signed)
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  Patient feeling well no questions or concerns last day of strict mask use/no eating in employee lunch room.  Encounter closed.

## 2021-10-26 ENCOUNTER — Other Ambulatory Visit: Payer: Self-pay | Admitting: Primary Care

## 2021-10-26 DIAGNOSIS — Z1231 Encounter for screening mammogram for malignant neoplasm of breast: Secondary | ICD-10-CM

## 2021-11-01 ENCOUNTER — Ambulatory Visit
Admission: RE | Admit: 2021-11-01 | Discharge: 2021-11-01 | Disposition: A | Discharge status: Home / Self Care | Payer: No Typology Code available for payment source | Source: Ambulatory Visit | Attending: Primary Care | Admitting: Primary Care

## 2021-11-01 ENCOUNTER — Other Ambulatory Visit: Payer: Self-pay

## 2021-11-01 DIAGNOSIS — Z1231 Encounter for screening mammogram for malignant neoplasm of breast: Secondary | ICD-10-CM

## 2021-11-17 ENCOUNTER — Other Ambulatory Visit: Payer: Self-pay

## 2021-11-17 ENCOUNTER — Ambulatory Visit (HOSPITAL_COMMUNITY)
Admission: RE | Admit: 2021-11-17 | Discharge: 2021-11-17 | Disposition: A | Discharge status: Home / Self Care | Payer: No Typology Code available for payment source | Source: Ambulatory Visit | Attending: Primary Care | Admitting: Primary Care

## 2021-11-17 DIAGNOSIS — R1011 Right upper quadrant pain: Secondary | ICD-10-CM | POA: Insufficient documentation

## 2021-11-17 MED ORDER — TECHNETIUM TC 99M MEBROFENIN IV KIT
5.3000 | PACK | Freq: Once | INTRAVENOUS | Status: AC | PRN
  Administered 2021-11-17: 5.3 via INTRAVENOUS

## 2021-12-27 ENCOUNTER — Encounter: Payer: Self-pay | Admitting: Internal Medicine

## 2021-12-27 ENCOUNTER — Ambulatory Visit: Payer: No Typology Code available for payment source | Admitting: Internal Medicine

## 2021-12-27 ENCOUNTER — Other Ambulatory Visit (INDEPENDENT_AMBULATORY_CARE_PROVIDER_SITE_OTHER): Payer: No Typology Code available for payment source

## 2021-12-27 VITALS — BP 120/80 | HR 62 | Ht 67.0 in | Wt 222.0 lb

## 2021-12-27 DIAGNOSIS — R748 Abnormal levels of other serum enzymes: Secondary | ICD-10-CM

## 2021-12-27 DIAGNOSIS — R1011 Right upper quadrant pain: Secondary | ICD-10-CM

## 2021-12-27 DIAGNOSIS — R0789 Other chest pain: Secondary | ICD-10-CM

## 2021-12-27 DIAGNOSIS — K760 Fatty (change of) liver, not elsewhere classified: Secondary | ICD-10-CM

## 2021-12-27 HISTORY — DX: Fatty (change of) liver, not elsewhere classified: K76.0

## 2021-12-27 LAB — GAMMA GT: GGT: 15 U/L (ref 7–51)

## 2021-12-27 LAB — HEPATIC FUNCTION PANEL
ALT: 11 U/L (ref 0–35)
AST: 13 U/L (ref 0–37)
Albumin: 4.5 g/dL (ref 3.5–5.2)
Alkaline Phosphatase: 134 U/L — ABNORMAL HIGH (ref 39–117)
Bilirubin, Direct: 0.2 mg/dL (ref 0.0–0.3)
Total Bilirubin: 0.7 mg/dL (ref 0.2–1.2)
Total Protein: 7.1 g/dL (ref 6.0–8.3)

## 2021-12-27 NOTE — Patient Instructions (Addendum)
? ? ? ?  Check out the book Stay off My Operating Table by Dr. Tracie Harrier - will tell you how to eat differently to get healthier ? ?Your provider has requested that you go to the basement level for lab work before leaving today. Press "B" on the elevator. The lab is located at the first door on the left as you exit the elevator. ? ?Due to recent changes in healthcare laws, you may see the results of your imaging and laboratory studies on MyChart before your provider has had a chance to review them.  We understand that in some cases there may be results that are confusing or concerning to you. Not all laboratory results come back in the same time frame and the provider may be waiting for multiple results in order to interpret others.  Please give [place] 48 hours in order for your provider to thoroughly review all the results before contacting the office for clarification of your results.  ? ?I appreciate the opportunity to care for you. ?Silvano Rusk, MD, [facility] ?

## 2021-12-27 NOTE — Progress Notes (Signed)
? Diana Lutz y.o. 1966/03/21 [id number] ? ?Assessment & Plan:  ? ?Encounter Diagnoses  ?Name Primary?  ? RUQ pain Yes  ? Abnormal alkaline phosphatase test   ? Musculoskeletal chest pain   ? NAFLD (nonalcoholic fatty liver disease)   ? ?I think this pain is musculoskeletal.  We will review with PCP about where to go question PT question sports medicine.  I would choose 1 of those or perhaps both. ? ?I do want to follow-up on the elevated alkaline phosphatase.That certainly could be from her nonalcoholic fatty liver disease question if she has Karlene Lineman though her transaminases were in the teens which goes against that.  She is on the road towards better metabolic health by changing how she eats and is encouraged to continue to do so.  I have given her a handout about fatty liver and its relationship to poor metabolic health and also have recommended the following- Check out the book Stay off My Operating Table by Dr. Tracie Harrier - will tell you how to eat differently to get healthier -this book reviews how reducing and eliminating processed foods and a variety of ways to improve his metabolic health. ? ? ?Labs today to include LFTs, GGT and 5 prime nucleotidase sorting out the alk phos.  If persistently elevated consider additional testing like mitochondrial antibodies.  Biliary tree has been normal throughout this.  So I do not think repeat imaging needed. ? ? ? ?Subjective:  ? ?Chief Complaint: Right upper quadrant pain ? ?HPI ?56 year old white woman with a history of diabetes mellitus (now off metformin), hypertension, migraines, and an adenomatous colon polyp here because of many months of right upper quadrant pain. ?Seen in October and September 2022 with having problems with anxiety and depression going through a separation and divorce, complaining of right upper quadrant pain radiation to to the right lower thoracic back and up into the upper chest with movement pain was constant increasing at  that time worse with walking and movement and also had some pain in the left upper abdomen she was also evaluated in occupational health in November with recurrent right upper quadrant pain ? ?She has been losing a lot of weight under stress and also trying to change how she eats.  Stress is better she has come to grips with her separation and divorce and is sleeping better and that not some much of an issue.  She has changed how she eats gotten off sugary beverages and artificially sweetened beverages and also is eating more salads and less carbohydrates. ? ?The pain she has is really related to movement if she rolls over in bed it may hurt on the right side she has a right anterior location in the lower rib cage, at also bothers her in the infrascapular area and sometimes it radiates up into the right chest around the breast.  Note that her mammogram was negative in January of this year.  She has a job where she works in [employer] but does not do a lot of lifting and not a lot of repetitive motion.  Food does not trigger her symptoms. ? ?Lab Results  ?Component Value Date  ? ALT 13 08/29/2021  ? AST 13 08/29/2021  ? GGT 14 08/29/2021  ? ALKPHOS 136 (H) 08/29/2021  ? BILITOT 0.4 08/29/2021  ? ?Alk phos was 123 September 2022 ? ?Amylase and lipase are normal in September 2022 ? ?Hemoglobin A1c 5.9% 1 year ago was 7.6% ? ?May 2022 lipids  cholesterol 201 triglycerides 164 (219 the year before that) HDL 41 LDL 126 VLDL 32 ?Imaging studies: ?HIDA scan with ejection fraction 56% normal 11/19/2021 ?CT abdomen with contrast 08/02/2021-hepatic steatosis ?Ultrasound abdomen limited right upper quadrant increased liver echogenicity diffusely no gallstones normal gallbladder-consistent with hepatic steatosis ?Chest x-ray October 2022 normal ? ? ?Screening colonoscopy 05/21/2020-diverticulosis-2 polyps diminutive 1 was an adenoma recall 2028 ? ? ? ?No Known Allergies ?Current Meds  ?Medication Sig  ? amLODipine (NORVASC) 10  MG tablet Take 1 tablet (10 mg total) by mouth daily. For blood pressure  ? calcium carbonate (TUMS - DOSED IN MG ELEMENTAL CALCIUM) 500 MG chewable tablet Chew 2 tablets by mouth daily as needed for indigestion or heartburn.  ? ciclopirox (LOPROX) 0.77 % cream Apply topically 2 (two) times daily.  ? [facility] LUBRICANT EYE DROPS 0.4-0.3 % SOLN APPLY 1 DROP TO EYE 3 (THREE) TIMES DAILY.  ? cyclobenzaprine (FLEXERIL) 5 MG tablet Take 1 tablet (5 mg total) by mouth 3 (three) times daily as needed for muscle spasms.  ? diclofenac (VOLTAREN) 50 MG EC tablet Take 1 tablet (50 mg total) by mouth 2 (two) times daily.  ? fluticasone (FLONASE) 50 MCG/ACT nasal spray PLACE 1 SPRAY INTO BOTH NOSTRILS 2 (TWO) TIMES DAILY  ? ibuprofen (ADVIL,MOTRIN) 600 MG tablet Take 600 mg by mouth daily as needed.  ? losartan (COZAAR) 100 MG tablet TAKE 1 TABLET BY MOUTH EVERY DAY FOR BLOOD PRESSURE  ? metFORMIN (GLUCOPHAGE-XR) 500 MG 24 hr tablet TAKE 1 TABLET (500 MG TOTAL) BY MOUTH DAILY WITH BREAKFAST. FOR DIABETES.  ? omeprazole (PRILOSEC) 20 MG capsule Take 1 capsule (20 mg total) by mouth daily. For heartburn.  ? ondansetron (ZOFRAN) 4 MG tablet Take 1 tablet (4 mg total) by mouth every 6 (six) hours.  ? rosuvastatin (CRESTOR) 5 MG tablet Take 1 tablet (5 mg total) by mouth every evening. For cholesterol.  ? ?Past Medical History:  ?Diagnosis Date  ? Abnormal Pap smear of cervix 1991  ? Anemia   ? history of anemia  ? Diabetes mellitus without complication ([place])   ? Dysplasia of cervix   ? cervical ca  ? GERD (gastroesophageal reflux disease)   ? Hx of adenomatous polyp of colon 05/30/2020  ? Hypertension   ? Migraines   ? ?Past Surgical History:  ?Procedure Laterality Date  ? ANTERIOR AND POSTERIOR REPAIR N/A 08/18/2015  ? Procedure: ANTERIOR (CYSTOCELE) AND POSTERIOR REPAIR (RECTOCELE)/Perineorrhaphy;  Surgeon: Azucena Fallen, MD;  Location: [facility];  Service: Gynecology;  Laterality: N/A;  2 hrs.  ? BLADDER SUSPENSION N/A 08/18/2015  ?  Procedure: TRANSVAGINAL TAPE (TVT) Mid-Urethral Sling PROCEDURE;  Surgeon: Azucena Fallen, MD;  Location: [facility];  Service: Gynecology;  Laterality: N/A;  ? COLONOSCOPY  11/15/2005  ? normal-Dr.Abrham Maslowski  ? CYSTOSCOPY N/A 08/18/2015  ? Procedure: CYSTOSCOPY;  Surgeon: Azucena Fallen, MD;  Location: [facility];  Service: Gynecology;  Laterality: N/A;  ? HYSTEROSCOPY W/ ENDOMETRIAL ABLATION    ? LASER ABLATION OF THE CERVIX    ? WISDOM TOOTH EXTRACTION    ? ?Social History  ? ?Social History Narrative  ? Married.  ? 3 children  ? Works for [employer] as Engineer, manufacturing systems.  ? Enjoys four wheeling, working on the house  ? ?family history includes Bladder Cancer in her father; Colon cancer (age of onset: 13) in her maternal grandmother; Colon polyps in her mother; Diabetes in her brother; Diverticulosis in her mother; Heart attack in her father; Hypertension in her brother,  father, and mother; Prostate cancer in her father; Stroke in her father. ? ? ?Review of Systems ?As per HPI ? ?Objective:  ? Physical Exam ?BP 120/80   Pulse 62   Ht 5' 7" (1.702 m)   Wt 222 lb (100.7 kg)   BMI 34.77 kg/m?  ?Obese white woman no acute distress ?Lungs are clear and resonant ?The chest wall is tender in the right anterior inferior ribs and also after twisting and moving provokes right infrascapular pain she is tender under the lower margin of the scapula.  The back itself is nontender.  The upper chest and mid chest do not appear tender today. ?The heart sounds are normal S1-S2 no rubs murmurs or gallops ?The abdomen is obese soft and nontender without organomegaly or mass detected ? ?Data reviewed as above ? ?

## 2021-12-28 ENCOUNTER — Telehealth: Payer: Self-pay | Admitting: Primary Care

## 2021-12-28 NOTE — Telephone Encounter (Signed)
Please notify patent that I received notification from GI that her pain was not abdominal. He recommended Physical therapy. Is she willing to go? [facility] or [facility]? ?

## 2021-12-29 NOTE — Telephone Encounter (Signed)
Left message to return call to our office.  

## 2021-12-31 LAB — NUCLEOTIDASE, 5', BLOOD: 5-Nucleotidase: 3 U/L (ref 0–10)

## 2022-01-11 ENCOUNTER — Encounter: Payer: Self-pay | Admitting: Registered Nurse

## 2022-01-11 ENCOUNTER — Telehealth: Payer: Self-pay | Admitting: Registered Nurse

## 2022-01-11 DIAGNOSIS — E559 Vitamin D deficiency, unspecified: Secondary | ICD-10-CM

## 2022-01-11 NOTE — Telephone Encounter (Signed)
Patient notified today if low vitamin D level still could be causing musculoskeletal discomfort.  Patient reported added 2000 units to her 5000 units cholecalciferol daily.  Patient instructed to have repeat Vitamin D level 3 months after starting new dose.  Schedule with RN Hildred Alamin nonfasting.  Patient verbalized understanding information/instructions and had no further questions at this time. ?

## 2022-01-11 NOTE — Telephone Encounter (Signed)
Patient saw GI 12/27/21 had labs and was told RUQ pain musculoskeletal not liver related and to follow up with sports medicine provider/PCM.  Patient stated she is going to contact [facility] office.  Patient stated able to perform her duties at work denied concerns acute.  Reminded patient RN Hildred Alamin and I onsite Tue/Thu; clinic closed Wed/Sat/Sun and RN Hildred Alamin only in clinic M, F  Patient gait sure and steady in warehouse, skin warm dry and pink, respirations even and unlabored RA, no cough/throat clearing, spoke full sentences without difficulty.  Patient pointing to RUQ/radiating up under ribcage right anterolateral today with palpation mild.  Discussed with patient I had reviewed GI note and recent labs and agreed with PCM follow up.  Alk Phos still slightly elevated 134 (normal below 117)  Patient has not had dexa scan or been told osteoporosis in the past.  History low vitamin D due for annual follow up Aug 2023.    Patient verbalized understanding information/instructions, agreed with plan of care and had no further questions at this time. ? ?Alk phos was 123 September 2022 ? Latest Reference Range & Units 12/27/21 09:14  ?Alkaline Phosphatase 39 - 117 U/L 134 (H)  ?Albumin 3.5 - 5.2 g/dL 4.5  ?AST 0 - 37 U/L 13  ?ALT 0 - 35 U/L 11  ?Total Protein 6.0 - 8.3 g/dL 7.1  ?Bilirubin, Direct 0.0 - 0.3 mg/dL 0.2  ?Total Bilirubin 0.2 - 1.2 mg/dL 0.7  ?GGT 7 - 51 U/L 15  ?5-Nucleotidase 0 - 10 U/L 3  ?(H): Data is abnormally high ?  ?Amylase and lipase are normal in September 2022 ?  ?Hemoglobin A1c 5.9% 1 year ago was 7.6% ?  ?May 2022 lipids cholesterol 201 triglycerides 164 (219 the year before that) HDL 41 LDL 126 VLDL 32 ?Imaging studies: ?HIDA scan with ejection fraction 56% normal 11/19/2021 ?CT abdomen with contrast 08/02/2021-hepatic steatosis ?Ultrasound abdomen limited right upper quadrant increased liver echogenicity diffusely no gallstones normal gallbladder-consistent with hepatic steatosis ?Chest x-ray October  2022 normal ?  ?  ?Screening colonoscopy 05/21/2020-diverticulosis-2 polyps diminutive 1 was an adenoma recall 2028 ?

## 2022-01-12 NOTE — Telephone Encounter (Signed)
Patient notified clinic staff has been on new dose 3 months now.  Patient instructed to schedule lab with RN Hildred Alamin nonfasting.  RN Hildred Alamin notified patient will be needing Vitamin D nonfasting. ?

## 2022-03-10 ENCOUNTER — Ambulatory Visit: Payer: Self-pay | Admitting: *Deleted

## 2022-03-10 DIAGNOSIS — E559 Vitamin D deficiency, unspecified: Secondary | ICD-10-CM

## 2022-03-11 ENCOUNTER — Encounter: Payer: Self-pay | Admitting: Registered Nurse

## 2022-03-11 DIAGNOSIS — E559 Vitamin D deficiency, unspecified: Secondary | ICD-10-CM

## 2022-03-11 LAB — VITAMIN D 25 HYDROXY (VIT D DEFICIENCY, FRACTURES): Vit D, 25-Hydroxy: 23.8 ng/mL — ABNORMAL LOW (ref 30.0–100.0)

## 2022-03-11 MED ORDER — CHOLECALCIFEROL 1.25 MG (50000 UT) PO TABS: 1.0000 | ORAL_TABLET | ORAL | 0 refills | Status: AC

## 2022-03-11 NOTE — Addendum Note (Signed)
Addended by: Gerarda Fraction A on: 03/11/2022 09:49 AM   Modules accepted: Orders

## 2022-03-29 NOTE — Telephone Encounter (Signed)
Feb 2023 HIDA scan normal.  GI referral by [facility] seen March 2023 GI provider thinks musculoskeletal pain but counseled her on NASH and labs ordered. Recommended dietary changes and follow up with sports medicine.

## 2022-04-18 ENCOUNTER — Ambulatory Visit: Payer: No Typology Code available for payment source | Admitting: Registered Nurse

## 2022-04-18 ENCOUNTER — Encounter: Payer: Self-pay | Admitting: Registered Nurse

## 2022-04-18 VITALS — HR 70 | Resp 16

## 2022-04-18 DIAGNOSIS — L247 Irritant contact dermatitis due to plants, except food: Secondary | ICD-10-CM

## 2022-04-18 MED ORDER — AQUAPHOR EX OINT: TOPICAL_OINTMENT | CUTANEOUS | 0 refills | Status: DC | PRN

## 2022-04-18 MED ORDER — PREDNISONE 10 MG PO TABS: ORAL_TABLET | ORAL | 0 refills | Status: DC

## 2022-04-18 MED ORDER — DIPHENHYDRAMINE HCL 2 % EX GEL: 1.0000 | Freq: Two times a day (BID) | CUTANEOUS | Status: DC | PRN

## 2022-04-18 NOTE — Patient Instructions (Signed)
Poison Ivy Dermatitis Poison ivy dermatitis is inflammation of the skin that is caused by chemicals in the leaves of the poison ivy plant. The skin reaction often involves redness, swelling, blisters, and extreme itching. What are the causes? This condition is caused by a chemical (urushiol) found in the sap of the poison ivy plant. This chemical is sticky and can be easily spread to people, animals, and objects. You can get poison ivy dermatitis by: Having direct contact with a poison ivy plant. Touching animals, other people, or objects that have come in contact with poison ivy and have the chemical on them. What increases the risk? This condition is more likely to develop in people who: Are outdoors often in wooded or [place] areas. Go outdoors without wearing protective clothing, such as closed shoes, long pants, and a long-sleeved shirt. What are the signs or symptoms? Symptoms of this condition include: Redness of the skin. Extreme itching. A rash that often includes bumps and blisters. The rash usually appears 48 hours after exposure, if you have been exposed before. If this is the first time you have been exposed, the rash may not appear until a week after exposure. Swelling. This may occur if the reaction is more severe. Symptoms usually last for 1-2 weeks. However, the first time you develop this condition, symptoms may last 3-4 weeks. How is this diagnosed? This condition may be diagnosed based on your symptoms and a physical exam. Your health care provider may also ask you about any recent outdoor activity. How is this treated? Treatment for this condition will vary depending on how severe it is. Treatment may include: Hydrocortisone cream or calamine lotion to relieve itching. Oatmeal baths to soothe the skin. Medicines, such as over-the-counter antihistamine tablets. Oral steroid medicine, for more severe reactions. Follow these instructions at home: Medicines Take or apply  over-the-counter and prescription medicines only as told by your health care provider. Use hydrocortisone cream or calamine lotion as needed to soothe the skin and relieve itching. General instructions Do not scratch or rub your skin. Apply a cold, wet cloth (cold compress) to the affected areas or take baths in cool water. This will help with itching. Avoid hot baths and showers. Take oatmeal baths as needed. Use colloidal oatmeal. You can get this at your local pharmacy or grocery store. Follow the instructions on the packaging. While you have the rash, wash clothes right after you wear them. Keep all follow-up visits as told by your health care provider. This is important. How is this prevented?  Learn to identify the poison ivy plant and avoid contact with the plant. This plant can be recognized by the number of leaves. Generally, poison ivy has three leaves with flowering branches on a single stem. The leaves are typically glossy, and they have jagged edges that come to a point at the front. If you have been exposed to poison ivy, thoroughly wash with soap and water right away. You have about 30 minutes to remove the plant resin before it will cause the rash. Be sure to wash under your fingernails, because any plant resin there will continue to spread the rash. When hiking or camping, wear clothes that will help you to avoid exposure on the skin. This includes long pants, a long-sleeved shirt, tall socks, and hiking boots. You can also apply preventive lotion to your skin to help limit exposure. If you suspect that your clothes or outdoor gear came in contact with poison ivy, rinse them off outside  with a garden hose before you bring them inside your house. When doing yard work or gardening, wear gloves, long sleeves, long pants, and boots. Wash your garden tools and gloves if they come in contact with poison ivy. If you suspect that your pet has come into contact with poison ivy, wash him or her  with pet shampoo and water. Make sure to wear gloves while washing your pet. Contact a health care provider if you have: Open sores in the rash area. More redness, swelling, or pain in the affected area. Redness that spreads beyond the rash area. Fluid, blood, or pus coming from the affected area. A fever. A rash over a large area of your body. A rash on your eyes, mouth, or genitals. A rash that does not improve after a few weeks. Get help right away if: Your face swells or your eyes swell shut. You have trouble breathing. You have trouble swallowing. These symptoms may represent a serious problem that is an emergency. Do not wait to see if the symptoms will go away. Get medical help right away. Call your local emergency services (911 in the [place] Do not drive yourself to the hospital. Summary Poison ivy dermatitis is inflammation of the skin that is caused by chemicals in the leaves of the poison ivy plant. Symptoms of this condition include redness, itching, a rash, and swelling. Do not scratch or rub your skin. Take or apply over-the-counter and prescription medicines only as told by your health care provider. This information is not intended to replace advice given to you by your health care provider. Make sure you discuss any questions you have with your health care provider. Document Revised: 07/11/2021 Document Reviewed: 07/11/2021 Elsevier Patient Education  [place].

## 2022-04-18 NOTE — Progress Notes (Signed)
Subjective:    Patient ID: Diana Lutz, female    DOB: [birth year], 56 y.o.   MRN: [id number]  55y/o established caucasian female here for evaluation arm and leg rash.  Seen by RN Mariann Laster in nurse clinic yesterday and booked appt with NP today.  See paper chart [facility] for RN note reviewed today when onsite.  Patient reported overnight arm rash improving just small spots left itchy, posterior thighs and medial thighs still red and very itchy.  Has tried hydrocortisone cream from [employer] yesterday and OTC lotion, washing area with some relief but rash spreading.  Denied fever/chills/purulent discharge.  Unsure if contact from dogs or she brushed against foliage.      Review of Systems  Constitutional:  Negative for activity change, appetite change, chills, diaphoresis, fatigue and fever.  HENT:  Negative for congestion, facial swelling, mouth sores, trouble swallowing and voice change.   Eyes:  Negative for photophobia and visual disturbance.  Respiratory:  Negative for cough, choking, chest tightness, shortness of breath, wheezing and stridor.   Cardiovascular:  Negative for chest pain and leg swelling.  Gastrointestinal:  Negative for constipation, diarrhea, nausea and vomiting.  Endocrine: Negative for cold intolerance and heat intolerance.  Genitourinary:  Negative for difficulty urinating.  Musculoskeletal:  Negative for back pain, gait problem, joint swelling, myalgias, neck pain and neck stiffness.  Skin:  Positive for color change and rash. Negative for pallor.  Allergic/Immunologic: Positive for environmental allergies. Negative for food allergies.  Neurological:  Negative for dizziness, tremors, seizures, syncope, speech difficulty, weakness, light-headedness, numbness and headaches.  Hematological:  Negative for adenopathy. Does not bruise/bleed easily.  Psychiatric/Behavioral:  Positive for sleep disturbance. Negative for agitation and confusion.         Objective:   Physical Exam Vitals and nursing note reviewed.  Constitutional:      General: She is awake. She is not in acute distress.    Appearance: Normal appearance. She is well-developed and well-groomed. She is obese. She is not ill-appearing, toxic-appearing or diaphoretic.  HENT:     Head: Normocephalic and atraumatic.     Jaw: There is normal jaw occlusion.     Salivary Glands: Right salivary gland is not diffusely enlarged. Left salivary gland is not diffusely enlarged.     Right Ear: Hearing and external ear normal.     Left Ear: Hearing and external ear normal.     Nose: Nose normal. No congestion or rhinorrhea.     Mouth/Throat:     Lips: Pink. No lesions.     Mouth: Mucous membranes are moist. No oral lesions or angioedema.     Dentition: No gum lesions.     Tongue: No lesions. Tongue does not deviate from midline.     Pharynx: Oropharynx is clear. Uvula midline.  Eyes:     General: Lids are normal. Vision grossly intact. Gaze aligned appropriately. Allergic shiner present. No scleral icterus.       Right eye: No discharge.        Left eye: No discharge.     Extraocular Movements: Extraocular movements intact.     Conjunctiva/sclera: Conjunctivae normal.     Pupils: Pupils are equal, round, and reactive to light.  Neck:     Trachea: Trachea and phonation normal. No tracheal deviation.  Cardiovascular:     Rate and Rhythm: Normal rate and regular rhythm.     Pulses: Normal pulses.          Radial  pulses are 2+ on the right side and 2+ on the left side.  Pulmonary:     Effort: Pulmonary effort is normal. No respiratory distress.     Breath sounds: Normal breath sounds and air entry. No stridor or transmitted upper airway sounds. No wheezing.     Comments: Spoke full sentences without difficulty; no cough observed in exam room Abdominal:     General: Abdomen is flat.  Musculoskeletal:        General: Normal range of motion.     Right upper arm: No swelling, edema,  deformity or lacerations.     Left upper arm: No swelling, edema, deformity or lacerations.     Right forearm: No swelling, edema, deformity, lacerations or tenderness.     Left forearm: No swelling, edema, deformity, lacerations or tenderness.     Right wrist: No swelling, deformity, effusion or lacerations.     Left wrist: No swelling, deformity, effusion or lacerations.     Right hand: No swelling, deformity, lacerations or tenderness. Normal range of motion. Normal strength. Normal capillary refill.     Left hand: No swelling, deformity, lacerations or tenderness. Normal range of motion. Normal strength. Normal capillary refill.     Cervical back: Normal range of motion and neck supple. No swelling, edema, deformity, erythema, signs of trauma, lacerations, rigidity or crepitus. Normal range of motion.     Thoracic back: No swelling, edema, deformity, signs of trauma or lacerations. Normal range of motion.     Lumbar back: No swelling, edema, deformity, signs of trauma or lacerations.     Right upper leg: Swelling, edema and tenderness present. No deformity or lacerations.     Left upper leg: No swelling, edema, deformity or lacerations.     Right lower leg: No swelling, deformity, lacerations or tenderness. No edema.     Left lower leg: No swelling, deformity, lacerations or tenderness. No edema.     Right ankle: No swelling, deformity, ecchymosis or lacerations.     Left ankle: No swelling, deformity, ecchymosis or lacerations.       Legs:     Comments: Right posterior and medial thigh localized swelling 0-1+/4 with macular erythematous rash slightly TTP; no fluctuance/crepitus on palpation skin dry  Lymphadenopathy:     Head:     Right side of head: No submandibular or preauricular adenopathy.     Left side of head: No submandibular or preauricular adenopathy.     Cervical:     Right cervical: No superficial cervical adenopathy.    Left cervical: No superficial cervical adenopathy.   Skin:    General: Skin is warm and dry.     Capillary Refill: Capillary refill takes less than 2 seconds.     Coloration: Skin is not ashen, cyanotic, jaundiced, mottled, pale or sallow.     Findings: Abrasion, erythema and rash present. No abscess, acne, bruising, burn, ecchymosis, signs of injury, laceration, lesion, petechiae or wound. Rash is macular, papular and urticarial. Rash is not crusting, nodular, purpuric, pustular, scaling or vesicular.          Comments: Linear abrasions scabbed noted bilateral anterior forearms clean dry scattered; medial and posterior right thigh/hamstring area macular erythema coalesced; bilateral forearms faint hives (resolving)  Neurological:     General: No focal deficit present.     Mental Status: She is alert and oriented to person, place, and time. Mental status is at baseline.     GCS: GCS eye subscore is 4. GCS verbal subscore  is 5. GCS motor subscore is 6.     Cranial Nerves: Cranial nerves 2-12 are intact. No cranial nerve deficit, dysarthria or facial asymmetry.     Sensory: Sensation is intact.     Motor: Motor function is intact. No weakness, tremor, atrophy, abnormal muscle tone or seizure activity.     Coordination: Coordination is intact. Coordination normal.     Gait: Gait is intact. Gait normal.     Comments: In/out of chair without difficulty; gait sure and steady in clinic; bilateral hand grasp equal 5/5  Psychiatric:        Attention and Perception: Attention and perception normal.        Mood and Affect: Mood and affect normal.        Speech: Speech normal.        Behavior: Behavior normal. Behavior is cooperative.        Thought Content: Thought content normal.        Cognition and Memory: Cognition and memory normal.        Judgment: Judgment normal.           Assessment & Plan:   A-plant irritant contact dermatitis  P-Dispensed from [facility] to patient prednisone '10mg'$  taper '30mg'$ x4 days, '20mg'$ x4 days, '10mg'$ x4 days #24 RF0  partial dispense #21 1 bottle given to patient will re-evaluate in one week.  Discussed with patient 6 day taper usually results in rebound rash I recommended 12-14 days.  Consider starting zyrtec '10mg'$  po BID prn itching OTC and famotidine '10mg'$  po BID prn itching.  calagel thin smear BID prn itching given 2 UD from clinic stock; do not get in eyes; if worsening with calagel use stop.  May continue hydrocortisone 1% topical BID prn itching OTC at home.  Wash hands before and after application.  Avoid hot steam showers/baths use tepid/cool water until rash cleared.  Avoid scratching as may results in secondary infection.  Apply emollient twice a day e.g. Fragrance free vaseline.  May apply ice/cold compress 5 minutes QID prn itching/swelling.   Shower prior to bedtime.  Avoid harsh/abrasive soaps use fragrance free/sensitive like dove/cetaphil.    Medication as directed. Call or return to clinic as needed if these symptoms worsen or fail to improve as anticipated. Exitcare handout on poison ivy  Follow up for re-evaluation in 48 hours if no improvement and/or worsening of rash with plan of care. Patient verbalized agreement and understanding of treatment plan and had no further questions at this time

## 2022-04-21 ENCOUNTER — Telehealth: Payer: Self-pay | Admitting: Registered Nurse

## 2022-04-21 ENCOUNTER — Encounter: Payer: Self-pay | Admitting: Registered Nurse

## 2022-04-21 DIAGNOSIS — L247 Irritant contact dermatitis due to plants, except food: Secondary | ICD-10-CM

## 2022-04-21 NOTE — Telephone Encounter (Signed)
See lab results note repeat vitamin D still low patient started 50,000 units po weekly and will recheck level in 3 months.

## 2022-04-27 NOTE — Telephone Encounter (Signed)
Per daughter patient returns from [place] later this week.  Discussed with her if patient has concerns to contact me.

## 2022-05-02 NOTE — Telephone Encounter (Signed)
Patient seen in workcenter today reported that rash resolved with first bottle of prednisone 9 days treatment.  Denied concerns or questions.  Discussed no further prednisone needed at this time/no need to restart taper if all symptoms resolved.  Patient verbalized understanding information/instructions, agreed with plan of care and had no further questions at this time.

## 2022-05-27 ENCOUNTER — Other Ambulatory Visit: Payer: Self-pay | Admitting: Registered Nurse

## 2022-06-02 NOTE — Telephone Encounter (Signed)
Patient seen in her workcenter.  Discussed nonfasting labs needed prior to refilling or changing vitamin D dose.  Patient to scheduled with RN Joaquim Lai in clinic.  Patient verbalized understanding information/instructions, agreed with plan of care and had no further questions at this time.

## 2022-06-06 ENCOUNTER — Other Ambulatory Visit: Payer: No Typology Code available for payment source

## 2022-06-06 DIAGNOSIS — E559 Vitamin D deficiency, unspecified: Secondary | ICD-10-CM

## 2022-06-06 NOTE — Progress Notes (Signed)
Venipuncture x 2.  Successful in right wrist.  Pt tolerated well. Pressure dressing applied and instructed patient to remove in 20 minutes.  Pt verbalized understanding

## 2022-06-07 ENCOUNTER — Other Ambulatory Visit: Payer: Self-pay | Admitting: Registered Nurse

## 2022-06-07 DIAGNOSIS — E559 Vitamin D deficiency, unspecified: Secondary | ICD-10-CM

## 2022-06-07 LAB — VITAMIN D 25 HYDROXY (VIT D DEFICIENCY, FRACTURES): Vit D, 25-Hydroxy: 29.5 ng/mL — ABNORMAL LOW (ref 30.0–100.0)

## 2022-06-07 MED ORDER — CHOLECALCIFEROL 1.25 MG (50000 UT) PO TABS
1.0000 | ORAL_TABLET | ORAL | 0 refills | Status: AC
Start: 2022-06-07 — End: 2022-08-24

## 2022-06-09 NOTE — Telephone Encounter (Signed)
Patient sent my chart message and epic noted patient reviewed on 06/07/22  [facility], your vitamin D level is still low but slowly improving.  I would like you to take another 12 weeks of 50,000 units cholecalciferol weekly by mouth and repeat vitamin D level in 3 months nonfasting.  New Rx sent to your pharmacy.  Please let me know if you have further questions/concerns.  Sincerely,  Gerarda Fraction NP-C

## 2022-08-26 ENCOUNTER — Encounter: Payer: Self-pay | Admitting: Registered Nurse

## 2022-08-26 ENCOUNTER — Telehealth: Payer: Self-pay | Admitting: Registered Nurse

## 2022-08-26 DIAGNOSIS — S61219D Laceration without foreign body of unspecified finger without damage to nail, subsequent encounter: Secondary | ICD-10-CM

## 2022-08-29 ENCOUNTER — Encounter: Payer: Self-pay | Admitting: Registered Nurse

## 2022-08-29 ENCOUNTER — Ambulatory Visit: Payer: No Typology Code available for payment source | Admitting: Registered Nurse

## 2022-08-29 DIAGNOSIS — S61219D Laceration without foreign body of unspecified finger without damage to nail, subsequent encounter: Secondary | ICD-10-CM

## 2022-08-29 NOTE — Progress Notes (Signed)
Subjective:    Patient ID: Diana Lutz, female    DOB: [birth year], 56 y.o.   MRN: [id number]  56y/o caucasian female established patient had finger laceration at work 08/25/22 seen at [facility] and 1 suture placed.  Patient keeping covered at work with [employer].  Denied concerns/questions and has follow up scheduled with Dr Maylon Peppers for suture removal/re-evaluation.  Denied discharge/worsening pain/swelling/redness/fever/chills.      Review of Systems  Constitutional:  Negative for chills and fever.  Respiratory:  Negative for cough and shortness of breath.   Skin:  Positive for wound. Negative for color change, pallor and rash.  Neurological:  Negative for weakness and numbness.       Objective:   Physical Exam Nursing note reviewed.  Constitutional:      General: She is awake. She is not in acute distress.    Appearance: Normal appearance. She is well-developed and well-groomed. She is obese. She is not ill-appearing, toxic-appearing or diaphoretic.  HENT:     Head: Normocephalic and atraumatic.     Jaw: There is normal jaw occlusion.     Salivary Glands: Right salivary gland is not diffusely enlarged. Left salivary gland is not diffusely enlarged.     Right Ear: Hearing and external ear normal.     Left Ear: Hearing and external ear normal.     Nose: Nose normal. No congestion or rhinorrhea.     Mouth/Throat:     Lips: Pink. No lesions.     Mouth: Mucous membranes are moist.     Pharynx: Oropharynx is clear.  Eyes:     General: Lids are normal. Vision grossly intact. Gaze aligned appropriately. No scleral icterus.       Right eye: No discharge.        Left eye: No discharge.     Extraocular Movements: Extraocular movements intact.     Conjunctiva/sclera: Conjunctivae normal.     Pupils: Pupils are equal, round, and reactive to light.  Neck:     Trachea: Trachea normal.  Pulmonary:     Effort: Pulmonary effort is normal. No  respiratory distress.     Breath sounds: Normal breath sounds and air entry. No stridor or transmitted upper airway sounds. No wheezing.     Comments: Spoke full sentences without difficulty; no cough observed in exam room Abdominal:     Palpations: Abdomen is soft.  Musculoskeletal:        General: Normal range of motion.     Cervical back: Normal range of motion and neck supple.  Lymphadenopathy:     Head:     Right side of head: No submandibular or preauricular adenopathy.     Left side of head: No submandibular or preauricular adenopathy.     Cervical:     Right cervical: No superficial cervical adenopathy.    Left cervical: No superficial cervical adenopathy.  Skin:    General: Skin is warm and dry.     Capillary Refill: Capillary refill takes less than 2 seconds.     Coloration: Skin is not ashen, cyanotic, jaundiced, mottled, pale or sallow.     Findings: Laceration present. No abrasion, abscess, acne, bruising, burn, ecchymosis, erythema, signs of injury, lesion, petechiae, rash or wound.     Nails: There is no clubbing.     Comments: 1cm laceration index finger wound edges well approximated clean dry 1 suture blue intact clean removed cobain and telfa for wound evaluation telfa clean dry no  discharge/bleeding; RN Kimrey applied new telfa and cobain to patient finger after exam from clinic stock  Neurological:     General: No focal deficit present.     Mental Status: She is alert and oriented to person, place, and time. Mental status is at baseline.     GCS: GCS eye subscore is 4. GCS verbal subscore is 5. GCS motor subscore is 6.     Cranial Nerves: No cranial nerve deficit, dysarthria or facial asymmetry.     Motor: Motor function is intact. No weakness, tremor, atrophy, abnormal muscle tone or seizure activity.     Coordination: Coordination is intact. Coordination normal.     Gait: Gait is intact. Gait normal.     Comments: In/out of chair without difficulty; gait sure and  steady in clinic; bilateral hand grasp equal 5/5  Psychiatric:        Attention and Perception: Attention and perception normal.        Mood and Affect: Mood and affect normal.        Speech: Speech normal.        Behavior: Behavior normal. Behavior is cooperative.        Thought Content: Thought content normal.        Cognition and Memory: Cognition and memory normal.        Judgment: Judgment normal.           Assessment & Plan:   A-finger laceration subsequent visit  P-Keep follow up appts as scheduled with worker's comp clinic as work injury.  Notify me via email [email or web address] if wound issues when [facility] closed due to holiday 11/23-11/26/23.  Patient was instructed to not soak finger in dirty water e.g. avoid pool, lake, hot tub, dirty sink water. May shower apply neosporin/vaseline BID keep wounds covered they will heal faster and prevent contamination rubbing from clothing tearing off scabs.  Given triple antibiotic, telfa, cobain from clinic stop change at least daily after washing with soap and water.  Change prn soiling. Exitcare handout on laceration. Medications as directed. Call or return to clinic as needed if these symptoms worsen or fail to improve as anticipated e.g. redness, discharge, swelling or worsening pain/laceration opens. Patient verbalized agreement and understanding of treatment plan and had no further questions at this time. P2: ROM, injury prevention

## 2022-08-29 NOTE — Patient Instructions (Signed)
Laceration Care, Adult A laceration is a cut that may go through all layers of the skin and into the tissue that is right under the skin. Some lacerations heal on their own. Others need to be closed with stitches (sutures), staples, skin adhesive strips, or skin glue. Proper care of a laceration reduces the risk for infection, helps the laceration heal better, and may prevent scarring. General tips Keep the wound clean and dry. Do not scratch or pick at the wound. Wash your hands with soap and water for at least 20 seconds before and after touching your wound or changing your bandage (dressing). If soap and water are not available, use hand sanitizer. Do not usedisinfectants or antiseptics, such as rubbing alcohol, to clean your wound unless told by your health care provider. If you were given a dressing, you should change it at least once a day, or as told by your health care provider. You should also change it if it becomes wet or dirty. How to care for your laceration If sutures or staples were used: Keep the wound completely dry for the first 24 hours, or as told by your health care provider. After that time, you may shower or bathe. Do not soak your wound in water until after the sutures or staples have been removed. Clean the wound once each day, or as told by your health care provider. To do this: Wash the wound with soap and water. Rinse the wound with water to remove all soap. Pat the wound dry with a clean towel. Do not rub the wound. After cleaning the wound, apply a thin layer of antibiotic ointment, other topical ointments, or a non-adherent dressing as told by your health care provider. This will help prevent infection and keep the dressing from sticking to the wound. Have the sutures or staples removed as told by your health care provider. Do not  remove sutures or staples yourself. If skin adhesive strips were used: Do not get the skin adhesive strips wet. You may shower or bathe,  but keep the wound dry. If the wound gets wet, pat it dry with a clean towel. Do not rub the wound. Skin adhesive strips fall off on their own. If adhesive strip edges start to loosen and curl up, you may trim the loose edges. Do not remove adhesive strips completely unless your health care provider tells you to do that. If skin glue was used: You may shower or bathe, but try to keep the wound dry. Do not soak the wound in water. After showering or bathing, pat the wound dry with a clean towel. Do not rub the wound. Do not do any activities that will make you sweat a lot until the skin glue has fallen off. Do not apply liquid, cream, or ointment medicine to the wound while the skin glue is in place. Doing this may loosen the film before the wound has healed. If a dressing is placed over the wound, do not apply tape directly over the skin glue. Doing this may cause the glue to be pulled off before the wound has healed. Do not pick at the glue. Skin glue usually remains in place for 5-10 days and then falls off the skin. Follow these instructions at home: Medicines Take over-the-counter and prescription medicines only as told by your health care provider. If you were prescribed an antibiotic medicine or ointment, take or apply it as told by your health care provider. Do not stop using it even if  your condition improves. Managing pain and swelling If directed, put ice on the injured area. To do this: Put ice in a plastic bag. Place a towel between your skin and the bag. Leave the ice on for 20 minutes, 2-3 times a day. Remove the ice if your skin turns bright red. This is very important. If you cannot feel pain, heat, or cold, you have a greater risk of damage to the area. Raise (elevate) the injured area above the level of your heart while you are sitting or lying down for the first 24-48 hours after the laceration is repaired. General instructions  Avoid any activity that could cause your wound  to reopen. Check your wound every day for signs of infection. Watch for: More redness, swelling, or pain. Fluid or blood. Warmth. Pus or a bad smell. Keep all follow-up visits. This is important. Contact a health care provider if: You received a tetanus shot and you have swelling, severe pain, redness, or bleeding at the injection site. Your closed wound breaks open. You have any of these signs of infection: More redness, swelling, or pain around your wound. Fluid or blood coming from your wound. Warmth coming from your wound. Pus or a bad smell coming from your wound. A fever. You notice something coming out of the wound, such as wood or glass. Your pain is not controlled with medicine. You notice a change in the color of your skin near your wound. You need to change the dressing often. You develop a new rash. You have numbness around the wound. Get help right away if: You develop severe swelling around the wound. Your pain suddenly increases and is severe. You develop painful lumps near the wound or on skin anywhere else on your body. You have a red streak going away from your wound. The wound is on your hand or foot, and you cannot properly move a finger or toe. The wound is on your hand or foot, and you notice that your fingers or toes look pale or bluish. Summary A laceration is a cut that may go through all layers of the skin and into the tissue that is right under the skin. Some lacerations heal on their own. Others need to be closed with stitches (sutures), staples, skin adhesive strips, or skin glue. Proper care of a laceration reduces the risk of infection, helps the laceration heal better, and may prevent scarring. This information is not intended to replace advice given to you by your health care provider. Make sure you discuss any questions you have with your health care provider. Document Revised: 12/02/2020 Document Reviewed: 12/02/2020 Elsevier Patient Education   [place].

## 2022-09-06 ENCOUNTER — Other Ambulatory Visit: Payer: Self-pay | Admitting: Registered Nurse

## 2022-09-06 DIAGNOSIS — E559 Vitamin D deficiency, unspecified: Secondary | ICD-10-CM

## 2022-09-06 NOTE — Telephone Encounter (Signed)
Patient seen office visit 08/29/22 see office note and seen in workcenter 09/05/22 suture removed wound edges well approximated skin dry scaling after suture removal discussed apply emollient BID until fully healed and especially at bedtime to help keep skin moisturized will heal faster/prevent secondary infection.  No erythema/tenderness/swelling/discharge full arom 2nd digit  Patient verbalized understanding information/instructions and denied further questions or concerns encounter closed.

## 2022-09-06 NOTE — Telephone Encounter (Signed)
Making sure you received these

## 2022-09-07 ENCOUNTER — Other Ambulatory Visit: Payer: No Typology Code available for payment source | Admitting: Occupational Medicine

## 2022-09-07 DIAGNOSIS — E559 Vitamin D deficiency, unspecified: Secondary | ICD-10-CM

## 2022-09-07 NOTE — Telephone Encounter (Signed)
Noted vitamin D level today and [facility] labs patient preferred later date

## 2022-09-07 NOTE — Progress Notes (Signed)
Lab drawn from [facility] tolerated well no issues noted.   

## 2022-09-08 ENCOUNTER — Other Ambulatory Visit: Payer: Self-pay | Admitting: Registered Nurse

## 2022-09-08 DIAGNOSIS — Z8639 Personal history of other endocrine, nutritional and metabolic disease: Secondary | ICD-10-CM

## 2022-09-08 LAB — VITAMIN D 25 HYDROXY (VIT D DEFICIENCY, FRACTURES): Vit D, 25-Hydroxy: 58.4 ng/mL (ref 30.0–100.0)

## 2022-09-08 MED ORDER — CHOLECALCIFEROL 1.25 MG (50000 UT) PO TABS: 1.0000 | ORAL_TABLET | ORAL | 1 refills | Status: AC

## 2022-09-13 NOTE — Telephone Encounter (Signed)
Latest Reference Range & Units 03/10/22 10:42 06/06/22 09:50 09/07/22 14:05  Vitamin D, 25-Hydroxy 30.0 - 100.0 ng/mL 23.8 (L) 29.5 (L) 58.4  (L): Data is abnormally low 09/07/22 1405   Result status: Final  Resulting lab: [facility]  Reference range: 30.0 - 100.0 ng/mL  Value: 58.4  Comment  My chart message sent to patient results note and patient red 09/09/22 at 1016 per Epic. Yuritzi,   Your vitamin D level is now normal.  I will send in 6 month supply 50,000 units vitamin D weekly Rx and would like you to recheck your level in 3-6 months. Please schedule with RN Evlyn Kanner. Goal is to keep you around 60 blood level. If this dose is pushing you over 70 I recommend decreasing to your 5000 units daily over the counter again.   Please let me know if you have further questions.   Sincerely,   Gerarda Fraction NP-C Vitamin D deficiency has been defined by the [place] practice guideline as a level of serum 25-OH vitamin D less than 20 ng/mL (1,2). The Endocrine Society went on to further define vitamin D insufficiency as a level between 21 and 29 ng/mL (2). 1. IOM (Institute of Medicine). 2010. Dietary reference    intakes for calcium and D. [place]: The    [employer]. 2. Holick MF, Binkley [place], Bischoff-Ferrari HA, et al.    Evaluation, treatment, and prevention of vitamin D    deficiency: an Endocrine Society clinical practice    guideline. JCEM. 2011 Jul; 96(7):1911-30.  *Additional information available - comment, narrative, result note

## 2022-09-15 ENCOUNTER — Ambulatory Visit (INDEPENDENT_AMBULATORY_CARE_PROVIDER_SITE_OTHER): Payer: No Typology Code available for payment source | Admitting: Primary Care

## 2022-09-15 ENCOUNTER — Encounter: Payer: Self-pay | Admitting: Primary Care

## 2022-09-15 VITALS — BP 138/96 | HR 90 | Temp 98.2°F | Ht 67.0 in | Wt 239.0 lb

## 2022-09-15 DIAGNOSIS — K219 Gastro-esophageal reflux disease without esophagitis: Secondary | ICD-10-CM

## 2022-09-15 DIAGNOSIS — I10 Essential (primary) hypertension: Secondary | ICD-10-CM | POA: Diagnosis not present

## 2022-09-15 DIAGNOSIS — Z23 Encounter for immunization: Secondary | ICD-10-CM | POA: Diagnosis not present

## 2022-09-15 DIAGNOSIS — E1165 Type 2 diabetes mellitus with hyperglycemia: Secondary | ICD-10-CM | POA: Diagnosis not present

## 2022-09-15 DIAGNOSIS — E559 Vitamin D deficiency, unspecified: Secondary | ICD-10-CM

## 2022-09-15 DIAGNOSIS — Z0000 Encounter for general adult medical examination without abnormal findings: Secondary | ICD-10-CM

## 2022-09-15 DIAGNOSIS — G4733 Obstructive sleep apnea (adult) (pediatric): Secondary | ICD-10-CM | POA: Diagnosis not present

## 2022-09-15 DIAGNOSIS — E119 Type 2 diabetes mellitus without complications: Secondary | ICD-10-CM

## 2022-09-15 DIAGNOSIS — K760 Fatty (change of) liver, not elsewhere classified: Secondary | ICD-10-CM

## 2022-09-15 DIAGNOSIS — F4323 Adjustment disorder with mixed anxiety and depressed mood: Secondary | ICD-10-CM

## 2022-09-15 DIAGNOSIS — E785 Hyperlipidemia, unspecified: Secondary | ICD-10-CM

## 2022-09-15 DIAGNOSIS — Z1231 Encounter for screening mammogram for malignant neoplasm of breast: Secondary | ICD-10-CM

## 2022-09-15 LAB — COMPREHENSIVE METABOLIC PANEL
ALT: 17 U/L (ref 0–35)
AST: 15 U/L (ref 0–37)
Albumin: 4.5 g/dL (ref 3.5–5.2)
Alkaline Phosphatase: 123 U/L — ABNORMAL HIGH (ref 39–117)
BUN: 12 mg/dL (ref 6–23)
CO2: 28 mEq/L (ref 19–32)
Calcium: 9.5 mg/dL (ref 8.4–10.5)
Chloride: 104 mEq/L (ref 96–112)
Creatinine, Ser: 0.7 mg/dL (ref 0.40–1.20)
GFR: 95.54 mL/min (ref >60.00)
Glucose, Bld: 121 mg/dL — ABNORMAL HIGH (ref 70–99)
Potassium: 3.9 mEq/L (ref 3.5–5.1)
Sodium: 140 mEq/L (ref 135–145)
Total Bilirubin: 0.9 mg/dL (ref 0.2–1.2)
Total Protein: 7.2 g/dL (ref 6.0–8.3)

## 2022-09-15 LAB — LIPID PANEL
Cholesterol: 170 mg/dL (ref 0–200)
HDL: 52.9 mg/dL (ref >39.00)
LDL Cholesterol: 93 mg/dL (ref 0–99)
NonHDL: 116.62
Total CHOL/HDL Ratio: 3
Triglycerides: 116 mg/dL (ref 0.0–149.0)
VLDL: 23.2 mg/dL (ref 0.0–40.0)

## 2022-09-15 LAB — HEMOGLOBIN A1C: Hgb A1c MFr Bld: 7 % — ABNORMAL HIGH (ref 4.6–6.5)

## 2022-09-15 LAB — MICROALBUMIN / CREATININE URINE RATIO
Creatinine,U: 175.9 mg/dL
Microalb Creat Ratio: 0.7 mg/g (ref 0.0–30.0)
Microalb, Ur: 1.3 mg/dL (ref 0.0–1.9)

## 2022-09-15 MED ORDER — ROSUVASTATIN CALCIUM 5 MG PO TABS: 5.0000 mg | ORAL_TABLET | Freq: Every evening | ORAL | 3 refills | Status: DC

## 2022-09-15 MED ORDER — AMLODIPINE BESYLATE 10 MG PO TABS: 10.0000 mg | ORAL_TABLET | Freq: Every day | ORAL | 3 refills | Status: DC

## 2022-09-15 MED ORDER — LOSARTAN POTASSIUM 100 MG PO TABS: ORAL_TABLET | ORAL | 3 refills | Status: DC

## 2022-09-15 MED ORDER — METFORMIN HCL ER 500 MG PO TB24: 500.0000 mg | ORAL_TABLET | Freq: Every day | ORAL | 1 refills | Status: DC

## 2022-09-15 MED ORDER — OMEPRAZOLE 20 MG PO CPDR: 20.0000 mg | DELAYED_RELEASE_CAPSULE | Freq: Every day | ORAL | 3 refills | Status: DC

## 2022-09-15 NOTE — Assessment & Plan Note (Signed)
Continue rosuvastatin 5 mg daily. Repeat lipid panel pending 

## 2022-09-15 NOTE — Addendum Note (Signed)
Addended by: Josem Kaufmann C on: 09/15/2022 11:30 AM   Modules accepted: Orders

## 2022-09-15 NOTE — Assessment & Plan Note (Signed)
Repeat A1c pending.  Has been out of metformin for greater than 1 month. Resume metformin XR 500 mg daily.  Follow-up in 6 months.

## 2022-09-15 NOTE — Assessment & Plan Note (Signed)
Controlled.  Remain off of treatment. Continue to monitor.

## 2022-09-15 NOTE — Assessment & Plan Note (Signed)
Shingrix due, first vaccine provided today.  Other vaccines up-to-date. Mammogram scheduled. Colonoscopy up-to-date, due 2028 Overdue, she will follow-up with GYN.  Discussed the importance of a healthy diet and regular exercise in order for weight loss, and to reduce the risk of further co-morbidity.  Exam stable. Labs pending.  Follow up in 1 year for repeat physical.

## 2022-09-15 NOTE — Patient Instructions (Signed)
Stop by the lab prior to leaving today. I will notify you of your results once received.   Follow up with your GYN. Complete your mammogram.   Please schedule a follow up visit for 6 months for a diabetes check.  It was a pleasure to see you today!  Preventive Care 62-58 Years Old, Female Preventive care refers to lifestyle choices and visits with your health care provider that can promote health and wellness. Preventive care visits are also called wellness exams. What can I expect for my preventive care visit? Counseling Your health care provider may ask you questions about your: Medical history, including: Past medical problems. Family medical history. Pregnancy history. Current health, including: Menstrual cycle. Method of birth control. Emotional well-being. Home life and relationship well-being. Sexual activity and sexual health. Lifestyle, including: Alcohol, nicotine or tobacco, and drug use. Access to firearms. Diet, exercise, and sleep habits. Work and work Statistician. Sunscreen use. Safety issues such as seatbelt and bike helmet use. Physical exam Your health care provider will check your: Height and weight. These may be used to calculate your BMI (body mass index). BMI is a measurement that tells if you are at a healthy weight. Waist circumference. This measures the distance around your waistline. This measurement also tells if you are at a healthy weight and may help predict your risk of certain diseases, such as type 2 diabetes and high blood pressure. Heart rate and blood pressure. Body temperature. Skin for abnormal spots. What immunizations do I need?  Vaccines are usually given at various ages, according to a schedule. Your health care provider will recommend vaccines for you based on your age, medical history, and lifestyle or other factors, such as travel or where you work. What tests do I need? Screening Your health care provider may recommend screening  tests for certain conditions. This may include: Lipid and cholesterol levels. Diabetes screening. This is done by checking your blood sugar (glucose) after you have not eaten for a while (fasting). Pelvic exam and Pap test. Hepatitis B test. Hepatitis C test. HIV (human immunodeficiency virus) test. STI (sexually transmitted infection) testing, if you are at risk. Lung cancer screening. Colorectal cancer screening. Mammogram. Talk with your health care provider about when you should start having regular mammograms. This may depend on whether you have a family history of breast cancer. BRCA-related cancer screening. This may be done if you have a family history of breast, ovarian, tubal, or peritoneal cancers. Bone density scan. This is done to screen for osteoporosis. Talk with your health care provider about your test results, treatment options, and if necessary, the need for more tests. Follow these instructions at home: Eating and drinking  Eat a diet that includes fresh fruits and vegetables, whole grains, lean protein, and low-fat dairy products. Take vitamin and mineral supplements as recommended by your health care provider. Do not drink alcohol if: Your health care provider tells you not to drink. You are pregnant, may be pregnant, or are planning to become pregnant. If you drink alcohol: Limit how much you have to 0-1 drink a day. Know how much alcohol is in your drink. In the [place] one drink equals one 12 oz bottle of beer (355 mL), one 5 oz glass of wine (148 mL), or one 1 oz glass of hard liquor (44 mL). Lifestyle Brush your teeth every morning and night with fluoride toothpaste. Floss one time each day. Exercise for at least 30 minutes 5 or more days each week. Do  not use any products that contain nicotine or tobacco. These products include cigarettes, chewing tobacco, and vaping devices, such as e-cigarettes. If you need help quitting, ask your health care provider. Do not  use drugs. If you are sexually active, practice safe sex. Use a condom or other form of protection to prevent STIs. If you do not wish to become pregnant, use a form of birth control. If you plan to become pregnant, see your health care provider for a prepregnancy visit. Take aspirin only as told by your health care provider. Make sure that you understand how much to take and what form to take. Work with your health care provider to find out whether it is safe and beneficial for you to take aspirin daily. Find healthy ways to manage stress, such as: Meditation, yoga, or listening to music. Journaling. Talking to a trusted person. Spending time with friends and family. Minimize exposure to UV radiation to reduce your risk of skin cancer. Safety Always wear your seat belt while driving or riding in a vehicle. Do not drive: If you have been drinking alcohol. Do not ride with someone who has been drinking. When you are tired or distracted. While texting. If you have been using any mind-altering substances or drugs. Wear a helmet and other protective equipment during sports activities. If you have firearms in your house, make sure you follow all gun safety procedures. Seek help if you have been physically or sexually abused. What's next? Visit your health care provider once a year for an annual wellness visit. Ask your health care provider how often you should have your eyes and teeth checked. Stay up to date on all vaccines. This information is not intended to replace advice given to you by your health care provider. Make sure you discuss any questions you have with your health care provider. Document Revised: 03/23/2021 Document Reviewed: 03/23/2021 Elsevier Patient Education  2023 Elsevier Inc.  

## 2022-09-15 NOTE — Addendum Note (Signed)
Addended by: Pleas Koch on: 09/15/2022 11:29 AM   Modules accepted: Level of Service

## 2022-09-15 NOTE — Assessment & Plan Note (Signed)
Repeat LFTs pending.

## 2022-09-15 NOTE — Assessment & Plan Note (Signed)
Above goal today, has been out of losartan 100 mg for >1 month.  Continue amlodipine 10 mg daily. Resume losartan 100 mg daily.  I have asked her to monitor her blood pressure and notify if readings do not decrease below 130/90.  CMP pending.

## 2022-09-15 NOTE — Assessment & Plan Note (Signed)
Compliant to CPAP nightly, continue same.

## 2022-09-15 NOTE — Assessment & Plan Note (Signed)
Controlled.  Continue follow-up with nurse practitioner from employer.

## 2022-09-15 NOTE — Assessment & Plan Note (Addendum)
Controlled.   Continue omeprazole 20 mg daily. 

## 2022-09-15 NOTE — Progress Notes (Signed)
Subjective:    Patient ID: Diana Lutz, female    DOB: Apr 17, 1966, 56 y.o.   MRN: [id number]  HPI  Diana Lutz is a very pleasant 56 y.o. female who presents today for complete physical and follow up of chronic conditions.  Immunizations: -Tetanus: 2019 -Influenza: Completed this season  -Shingles: Never completed -Pneumonia: 2021  Diet: [place].  Exercise: No regular exercise.  Eye exam: Completes annually  Dental exam: Completes semi-annually   Pap Smear: Completed over 3 years ago, will schedule  Mammogram: Completed in January 2023  Colonoscopy: Completed in 2021, due 2028  BP Readings from Last 3 Encounters:  09/15/22 (!) 138/96  06/06/22 138/87  12/27/21 120/80        Review of Systems  Constitutional:  Negative for unexpected weight change.  HENT:  Negative for rhinorrhea.   Respiratory:  Negative for cough and shortness of breath.   Cardiovascular:  Negative for chest pain.  Gastrointestinal:  Negative for constipation and diarrhea.  Genitourinary:  Negative for difficulty urinating and menstrual problem.  Musculoskeletal:  Negative for arthralgias and myalgias.  Skin:  Negative for rash.  Allergic/Immunologic: Negative for environmental allergies.  Neurological:  Positive for headaches. Negative for dizziness.  Psychiatric/Behavioral:  The patient is not nervous/anxious.          Past Medical History:  Diagnosis Date   Abnormal Pap smear of cervix 1991   Acute right-sided thoracic back pain 05/17/2021   Anemia    history of anemia   Diabetes mellitus without complication (HCC)    Dysplasia of cervix    cervical ca   GERD (gastroesophageal reflux disease)    Hx of adenomatous polyp of colon 05/30/2020   Hypertension    Migraines    NAFLD (nonalcoholic fatty liver disease) 12/27/2021   Right upper quadrant abdominal pain 06/15/2021    Social History   Socioeconomic History   Marital status: Married    Spouse name: Not  on file   Number of children: Not on file   Years of education: Not on file   Highest education level: Not on file  Occupational History   Not on file  Tobacco Use   Smoking status: Never   Smokeless tobacco: Never  Vaping Use   Vaping Use: Never used  Substance and Sexual Activity   Alcohol use: Not Currently    Alcohol/week: 0.0 standard drinks of alcohol    Comment: social   Drug use: No   Sexual activity: Not Currently  Other Topics Concern   Not on file  Social History Narrative   Married.   3 children   Works for [employer] as Engineer, manufacturing systems.   Enjoys four wheeling, working on the house   Social Determinants of Health   Financial Resource Strain: Not on file  Food Insecurity: Not on file  Transportation Needs: Not on file  Physical Activity: Not on file  Stress: Not on file  Social Connections: Not on file  Intimate Partner Violence: Not on file    Past Surgical History:  Procedure Laterality Date   ANTERIOR AND POSTERIOR REPAIR N/A 08/18/2015   Procedure: ANTERIOR (CYSTOCELE) AND POSTERIOR REPAIR (RECTOCELE)/Perineorrhaphy;  Surgeon: Azucena Fallen, MD;  Location: [facility];  Service: Gynecology;  Laterality: N/A;  2 hrs.   BLADDER SUSPENSION N/A 08/18/2015   Procedure: TRANSVAGINAL TAPE (TVT) Mid-Urethral Sling PROCEDURE;  Surgeon: Azucena Fallen, MD;  Location: [facility];  Service: Gynecology;  Laterality: N/A;   COLONOSCOPY  11/15/2005   normal-Dr.Gessner  CYSTOSCOPY N/A 08/18/2015   Procedure: CYSTOSCOPY;  Surgeon: Azucena Fallen, MD;  Location: [facility];  Service: Gynecology;  Laterality: N/A;   HYSTEROSCOPY W/ ENDOMETRIAL ABLATION     LASER ABLATION OF THE CERVIX     WISDOM TOOTH EXTRACTION      Family History  Problem Relation Age of Onset   Hypertension Mother    Diverticulosis Mother    Colon polyps Mother    Hypertension Father    Stroke Father    Heart attack Father    Prostate cancer Father    Bladder Cancer Father    Hypertension Brother     Diabetes Brother    Colon cancer Maternal Grandmother 75   Esophageal cancer Neg Hx    Rectal cancer Neg Hx    Stomach cancer Neg Hx    Breast cancer Neg Hx     No Known Allergies  Current Outpatient Medications on File Prior to Visit  Medication Sig Dispense Refill   calcium carbonate (TUMS - DOSED IN MG ELEMENTAL CALCIUM) 500 MG chewable tablet Chew 2 tablets by mouth daily as needed for indigestion or heartburn.     Cholecalciferol 1.25 MG (50000 UT) TABS Take 1 tablet by mouth once a week for 12 doses. 12 tablet 1   [employer] LUBRICANT EYE DROPS 0.4-0.3 % SOLN APPLY 1 DROP TO EYE 3 (THREE) TIMES DAILY.  12   ibuprofen (ADVIL,MOTRIN) 600 MG tablet Take 600 mg by mouth daily as needed.     ondansetron (ZOFRAN) 4 MG tablet Take 1 tablet (4 mg total) by mouth every 6 (six) hours. 12 tablet 0   fluticasone (FLONASE) 50 MCG/ACT nasal spray PLACE 1 SPRAY INTO BOTH NOSTRILS 2 (TWO) TIMES DAILY 48 mL 2   sodium chloride (OCEAN) 0.65 % SOLN nasal spray Place 2 sprays into both nostrils every 2 (two) hours while awake.  0   No current facility-administered medications on file prior to visit.    BP (!) 138/96   Pulse 90   Temp 98.2 F (36.8 C) (Temporal)   Ht '5\' 7"'$  (1.702 m)   Wt 239 lb (108.4 kg)   SpO2 98%   BMI 37.43 kg/m  Objective:   Physical Exam HENT:     Right Ear: Tympanic membrane and ear canal normal.     Left Ear: Tympanic membrane and ear canal normal.     Nose: Nose normal.  Eyes:     Conjunctiva/sclera: Conjunctivae normal.     Pupils: Pupils are equal, round, and reactive to light.  Neck:     Thyroid: No thyromegaly.  Cardiovascular:     Rate and Rhythm: Normal rate and regular rhythm.     Heart sounds: No murmur heard. Pulmonary:     Effort: Pulmonary effort is normal.     Breath sounds: Normal breath sounds. No rales.  Abdominal:     General: Bowel sounds are normal.     Palpations: Abdomen is soft.     Tenderness: There is no abdominal tenderness.   Musculoskeletal:        General: Normal range of motion.     Cervical back: Neck supple.  Lymphadenopathy:     Cervical: No cervical adenopathy.  Skin:    General: Skin is warm and dry.     Findings: No rash.  Neurological:     Mental Status: She is alert and oriented to person, place, and time.     Cranial Nerves: No cranial nerve deficit.     Deep  Tendon Reflexes: Reflexes are normal and symmetric.  Psychiatric:        Mood and Affect: Mood normal.           Assessment & Plan:   Problem List Items Addressed This Visit       Cardiovascular and Mediastinum   Essential hypertension    Above goal today, has been out of losartan 100 mg for >1 month.  Continue amlodipine 10 mg daily. Resume losartan 100 mg daily.  I have asked her to monitor her blood pressure and notify if readings do not decrease below 130/90.  CMP pending.      Relevant Medications   amLODipine (NORVASC) 10 MG tablet   losartan (COZAAR) 100 MG tablet   rosuvastatin (CRESTOR) 5 MG tablet     Respiratory   OSA (obstructive sleep apnea)    Compliant to CPAP nightly, continue same.         Digestive   Gastroesophageal reflux disease    Controlled.  Continue omeprazole 20 mg daily.      Relevant Medications   omeprazole (PRILOSEC) 20 MG capsule   NAFLD (nonalcoholic fatty liver disease)    Repeat LFTs pending.        Endocrine   Type 2 diabetes mellitus with hyperglycemia (HCC)    Repeat A1c pending.  Has been out of metformin for greater than 1 month. Resume metformin XR 500 mg daily.  Follow-up in 6 months.      Relevant Medications   losartan (COZAAR) 100 MG tablet   metFORMIN (GLUCOPHAGE-XR) 500 MG 24 hr tablet   rosuvastatin (CRESTOR) 5 MG tablet   Other Relevant Orders   Microalbumin/Creatinine Ratio, Urine   Hemoglobin A1c     Other   Vitamin D deficiency    Controlled.  Continue follow-up with nurse practitioner from employer.      Preventative health care -  Primary    Shingrix due, first vaccine provided today.  Other vaccines up-to-date. Mammogram scheduled. Colonoscopy up-to-date, due 2028 Overdue, she will follow-up with GYN.  Discussed the importance of a healthy diet and regular exercise in order for weight loss, and to reduce the risk of further co-morbidity.  Exam stable. Labs pending.  Follow up in 1 year for repeat physical.       Adjustment reaction with anxiety and depression    Controlled.  Remain off of treatment. Continue to monitor.      Hyperlipidemia    Continue rosuvastatin 5 mg daily. Repeat lipid panel pending.      Relevant Medications   amLODipine (NORVASC) 10 MG tablet   losartan (COZAAR) 100 MG tablet   rosuvastatin (CRESTOR) 5 MG tablet   Other Relevant Orders   Lipid panel   Comprehensive metabolic panel   Other Visit Diagnoses     Encounter for screening mammogram for malignant neoplasm of breast       Relevant Orders   MM 3D SCREEN BREAST BILATERAL   Type 2 diabetes mellitus without complication, without long-term current use of insulin (HCC)       Relevant Medications   losartan (COZAAR) 100 MG tablet   metFORMIN (GLUCOPHAGE-XR) 500 MG 24 hr tablet   rosuvastatin (CRESTOR) 5 MG tablet          Pleas Koch, NP

## 2022-09-21 NOTE — Telephone Encounter (Signed)
Per epic patient read message on 09/09/22 and seen in warehouse today no further questions

## 2022-10-04 ENCOUNTER — Ambulatory Visit: Payer: No Typology Code available for payment source | Admitting: Occupational Medicine

## 2022-10-04 VITALS — BP 114/78 | HR 75 | Ht 67.0 in | Wt 239.0 lb

## 2022-10-04 DIAGNOSIS — Z0000 Encounter for general adult medical examination without abnormal findings: Secondary | ICD-10-CM

## 2022-10-04 NOTE — Progress Notes (Signed)
[employer] insurance premium discount evaluation: Went over labs from 09/15/22. [employer] ROI formed signed. Forms placed in the chart. Patient has MyChart  and Tele doc setup. PCP seen on 09/15/22. Patient recently back on BP meds and BP doing better. PCP put back on Metformin A1C is 7.0 schedule to recheck in March and given handout to schedule appt with dietitian.

## 2022-10-26 LAB — HM MAMMOGRAPHY

## 2022-10-31 ENCOUNTER — Encounter: Payer: Self-pay | Admitting: Primary Care

## 2022-10-31 ENCOUNTER — Encounter: Payer: Self-pay | Admitting: Registered Nurse

## 2022-10-31 ENCOUNTER — Ambulatory Visit: Payer: No Typology Code available for payment source | Admitting: Registered Nurse

## 2022-10-31 VITALS — BP 108/85 | HR 79 | Temp 98.3°F | Resp 18

## 2022-10-31 DIAGNOSIS — J209 Acute bronchitis, unspecified: Secondary | ICD-10-CM

## 2022-10-31 DIAGNOSIS — J019 Acute sinusitis, unspecified: Secondary | ICD-10-CM

## 2022-10-31 DIAGNOSIS — H6123 Impacted cerumen, bilateral: Secondary | ICD-10-CM

## 2022-10-31 MED ORDER — DEBROX 6.5 % OT SOLN: 5.0000 [drp] | Freq: Two times a day (BID) | OTIC | 0 refills | Status: AC

## 2022-10-31 MED ORDER — PREDNISONE 10 MG PO TABS: ORAL_TABLET | ORAL | 0 refills | Status: AC

## 2022-10-31 MED ORDER — AMOXICILLIN-POT CLAVULANATE 875-125 MG PO TABS: 1.0000 | ORAL_TABLET | Freq: Two times a day (BID) | ORAL | 0 refills | Status: AC

## 2022-10-31 NOTE — Progress Notes (Signed)
Subjective:    Patient ID: Diana Lutz, female    DOB: Jun 26, 1966, 57 y.o.   MRN: [id number]  57y/o established caucasian female here for evaluation cough/sinus pain pressure.  White productive cough not improving.  Covid test negative/denied fever/chills/n/v/d.  Just not improving and cough worsening.  Some wheezing in my throat at times with mucous.  Has required prednisone in the past with bronchitis.  Using saline nasal, nasal flonase and motrin.      Review of Systems  Constitutional:  Positive for fatigue. Negative for activity change, appetite change, chills, diaphoresis and fever.  HENT:  Positive for congestion, ear pain, postnasal drip, sinus pressure, sinus pain and sore throat. Negative for ear discharge, facial swelling, hearing loss, mouth sores, nosebleeds, rhinorrhea, sneezing, tinnitus, trouble swallowing and voice change.   Eyes:  Negative for photophobia, pain, discharge, redness, itching and visual disturbance.  Respiratory:  Positive for cough and wheezing. Negative for shortness of breath and stridor.   Cardiovascular:  Negative for chest pain.  Gastrointestinal:  Negative for diarrhea, nausea and vomiting.  Genitourinary:  Negative for difficulty urinating.  Musculoskeletal:  Negative for gait problem, myalgias, neck pain and neck stiffness.  Skin:  Negative for rash.  Neurological:  Negative for dizziness, tremors, seizures, syncope, facial asymmetry, speech difficulty, weakness, light-headedness, numbness and headaches.  Psychiatric/Behavioral:  Positive for sleep disturbance. Negative for agitation and confusion.        Objective:   Physical Exam Vitals and nursing note reviewed.  Constitutional:      General: She is awake. She is not in acute distress.    Appearance: Normal appearance. She is well-developed and well-groomed. She is obese. She is ill-appearing. She is not toxic-appearing or diaphoretic.  HENT:     Head: Normocephalic and atraumatic.      Jaw: There is normal jaw occlusion. No trismus.     Salivary Glands: Right salivary gland is not diffusely enlarged or tender. Left salivary gland is not diffusely enlarged or tender.     Right Ear: Hearing, ear canal and external ear normal. No decreased hearing noted. No laceration, drainage, swelling or tenderness. There is impacted cerumen.     Left Ear: Hearing, ear canal and external ear normal. No decreased hearing noted. No laceration, drainage, swelling or tenderness. There is impacted cerumen.     Ears:     Comments: Soft dark brown cerumen occluding bilateral auditory canals/TMs; curettage after verbal permission from patient removed 50% but cerumen remained overlying TMs 100%    Nose: Mucosal edema, congestion and rhinorrhea present. No nasal deformity, septal deviation or laceration. Rhinorrhea is clear.     Right Turbinates: Enlarged and swollen. Not pale.     Left Turbinates: Enlarged and swollen. Not pale.     Right Sinus: Maxillary sinus tenderness present. No frontal sinus tenderness.     Left Sinus: Maxillary sinus tenderness present. No frontal sinus tenderness.     Comments: Bilateral maxillary sinuses TTP; cobblestoning posterior pharynx; bilateral allergic shiners; nasal turbinates edema/erythema clear discharge    Mouth/Throat:     Lips: Pink. No lesions.     Mouth: Mucous membranes are moist. Mucous membranes are not pale, not dry and not cyanotic. No lacerations, oral lesions or angioedema.     Dentition: No dental abscesses or gum lesions.     Pharynx: Uvula midline. Pharyngeal swelling and posterior oropharyngeal erythema present. No oropharyngeal exudate or uvula swelling.     Tonsils: No tonsillar exudate or tonsillar abscesses.  Eyes:     General: Lids are normal. Vision grossly intact. Gaze aligned appropriately. Allergic shiner present. No scleral icterus.       Right eye: No foreign body, discharge or hordeolum.        Left eye: No foreign body, discharge  or hordeolum.     Extraocular Movements: Extraocular movements intact.     Right eye: Normal extraocular motion and no nystagmus.     Left eye: Normal extraocular motion and no nystagmus.     Conjunctiva/sclera: Conjunctivae normal.     Right eye: Right conjunctiva is not injected. No chemosis, exudate or hemorrhage.    Left eye: Left conjunctiva is not injected. No chemosis, exudate or hemorrhage.    Pupils: Pupils are equal, round, and reactive to light. Pupils are equal.     Right eye: Pupil is round and reactive.     Left eye: Pupil is round and reactive.  Neck:     Thyroid: No thyroid mass or thyromegaly.     Trachea: Trachea and phonation normal. No tracheal tenderness or tracheal deviation.  Cardiovascular:     Rate and Rhythm: Normal rate and regular rhythm.     Pulses: Normal pulses.          Radial pulses are 2+ on the right side and 2+ on the left side.     Heart sounds: Normal heart sounds, S1 normal and S2 normal.  Pulmonary:     Effort: Pulmonary effort is normal. No accessory muscle usage or respiratory distress.     Breath sounds: Normal breath sounds and air entry. No stridor, decreased air movement or transmitted upper airway sounds. No decreased breath sounds, wheezing, rhonchi or rales.     Comments: Frequent cough in exam room nonproductive spoke full sentences without difficulty wearing mask Chest:     Chest wall: No tenderness.  Abdominal:     General: There is no distension.     Palpations: Abdomen is soft.  Musculoskeletal:        General: No tenderness. Normal range of motion.     Right hand: Normal strength. Normal capillary refill.     Left hand: Normal strength. Normal capillary refill.     Cervical back: Normal range of motion and neck supple. No swelling, edema, deformity, erythema, signs of trauma, lacerations, rigidity, torticollis, tenderness or crepitus. No pain with movement. Normal range of motion.     Thoracic back: No swelling, edema, deformity,  signs of trauma, lacerations or tenderness. Normal range of motion.     Right hip: Normal.     Left hip: Normal.     Right knee: Normal.     Left knee: Normal.     Right lower leg: No edema.     Left lower leg: No edema.  Lymphadenopathy:     Head:     Right side of head: No submental, submandibular, tonsillar, preauricular, posterior auricular or occipital adenopathy.     Left side of head: No submental, submandibular, tonsillar, preauricular, posterior auricular or occipital adenopathy.     Cervical: No cervical adenopathy.     Right cervical: No superficial, deep or posterior cervical adenopathy.    Left cervical: No superficial, deep or posterior cervical adenopathy.  Skin:    General: Skin is warm and dry.     Capillary Refill: Capillary refill takes less than 2 seconds.     Coloration: Skin is not ashen, cyanotic, jaundiced, mottled, pale or sallow.     Findings: No abrasion,  abscess, acne, bruising, burn, ecchymosis, erythema, signs of injury, laceration, lesion, petechiae, rash or wound.     Nails: There is no clubbing.  Neurological:     General: No focal deficit present.     Mental Status: She is alert and oriented to person, place, and time. Mental status is at baseline. She is not disoriented.     GCS: GCS eye subscore is 4. GCS verbal subscore is 5. GCS motor subscore is 6.     Cranial Nerves: No cranial nerve deficit, dysarthria or facial asymmetry.     Sensory: Sensation is intact. No sensory deficit.     Motor: Motor function is intact. No weakness, tremor, atrophy, abnormal muscle tone or seizure activity.     Coordination: Coordination is intact. Coordination normal.     Gait: Gait is intact. Gait normal.     Comments: On/off exam table/in/out of chair without difficulty; gait sure and steady in clinic; bilateral hand grasp equal 5/5  Psychiatric:        Attention and Perception: Attention and perception normal.        Mood and Affect: Mood and affect normal.         Speech: Speech normal.        Behavior: Behavior normal. Behavior is cooperative.        Thought Content: Thought content normal.        Cognition and Memory: Cognition and memory normal.        Judgment: Judgment normal.           Assessment & Plan:   A-acute rhinosinusitis; acute bronchitis; bilateral cerumen impaction  P-continue flonase 1 spray each nostril BID, saline 2 sprays each nostril q2h wa prn congestion. start augmentin '875mg'$  po BID x 10 days #20 RF0 and prednisone '10mg'$  taper with breakfast po '30mg'$  x 3 days '20mg'$  x 3 days then '10mg'$  x 3 days #21 RF0 dispensed from [facility] to patient  Denied personal or family history of ENT cancer.  Shower BID especially prior to bed. No evidence of systemic bacterial infection, non toxic and well hydrated.  I do not see where any further testing or imaging is necessary at this time.   I will suggest supportive care, rest, good hygiene and encourage the patient to take adequate fluids.  The patient is to return to clinic or EMERGENCY ROOM if symptoms worsen or change significantly.  Exitcare handout on sinusitis and sinus rinse.  Patient verbalized agreement and understanding of treatment plan and had no further questions at this time.   P2:  Hand washing and cover cough   Rx prednisone '10mg'$  po taper with breakfast '30mg'$  x 3 days  '20mg'$  x 3 days '10mg'$  x 3 days #21 RF0 dispensed from [facility]  Cough lozenges po q2h prn cough given 8 UD from clinic stock.  Discussed possible side effects increased/decreased appetite, difficulty sleeping, increased blood sugar, increased blood pressure and heart rate.  Bronchitis simple, community acquired, may have started as viral (probably respiratory syncytial, parainfluenza, influenza, or adenovirus), but now evidence of acute purulent bronchitis with resultant bronchial edema and mucus formation.  Viruses are the most common cause of bronchial inflammation in otherwise healthy adults with acute bronchitis.  The appearance of  sputum is not predictive of whether a bacterial infection is present.  Purulent sputum is most often caused by viral infections.  There are a small portion of those caused by non-viral agents being Mycoplama pneumonia.  Microscopic examination or C&S of sputum in  the healthy adult with acute bronchitis is generally not helpful (usually negative or normal respiratory flora) other considerations being cough from upper respiratory tract infections, sinusitis or allergic syndromes (mild asthma or viral pneumonia).  Differential Diagnoses:  reactive airway disease (asthma, allergic aspergillosis (eosinophilia), chronic bronchitis, respiratory infection (sinusitis, common cold, pneumonia), congestive heart failure, reflux esophagitis, bronchogenic tumor, aspiration syndromes and/or exposure to pulmonary irritants/smoke. Without high fever, severe dyspnea, lack of physical findings or other risk factors, I will hold on a chest radiograph and CBC at this time.  I discussed that approximately 50% of patients with acute bronchitis have a cough that lasts up to three weeks, and 25% for over a month.  Tylenol '500mg'$  one to two tablets every four to six hours as needed for fever or myalgias.  No aspirin. ER if hemoptysis, SOB, worst chest pain of life.   Patient instructed to follow up if no improvement in one week or sooner if symptoms worsen.  Patient verbalized agreement and understanding of treatment plan and had no further questions at this time.  P2:  hand washing and cover cough   Curettage performed but unable to clear obstruction.  Start debrox 5gtts bilateral auditory canals x 1 week and follow up in clinic for re-evaluation to see if cerumen cleared.  Discussed purpose of earwax with patient.  Avoid cotton applicator (Q-tip) use in ears.  exitcare handout on cerumen impaction  Patient verbalized understanding, agreed with plan of care and had no further questions at this time.

## 2022-10-31 NOTE — Patient Instructions (Signed)
Sinus Infection, Adult A sinus infection, also called sinusitis, is inflammation of your sinuses. Sinuses are hollow spaces in the bones around your face. Your sinuses are located: Around your eyes. In the middle of your forehead. Behind your nose. In your cheekbones. Mucus normally drains out of your sinuses. When your nasal tissues become inflamed or swollen, mucus can become trapped or blocked. This allows bacteria, viruses, and fungi to grow, which leads to infection. Most infections of the sinuses are caused by a virus. A sinus infection can develop quickly. It can last for up to 4 weeks (acute) or for more than 12 weeks (chronic). A sinus infection often develops after a cold. What are the causes? This condition is caused by anything that creates swelling in the sinuses or stops mucus from draining. This includes: Allergies. Asthma. Infection from bacteria or viruses. Deformities or blockages in your nose or sinuses. Abnormal growths in the nose (nasal polyps). Pollutants, such as chemicals or irritants in the air. Infection from fungi. This is rare. What increases the risk? You are more likely to develop this condition if you: Have a weak body defense system (immune system). Do a lot of swimming or diving. Overuse nasal sprays. Smoke. What are the signs or symptoms? The main symptoms of this condition are pain and a feeling of pressure around the affected sinuses. Other symptoms include: Stuffy nose or congestion that makes it difficult to breathe through your nose. Thick yellow or greenish drainage from your nose. Tenderness, swelling, and warmth over the affected sinuses. A cough that may get worse at night. Decreased sense of smell and taste. Extra mucus that collects in the throat or the back of the nose (postnasal drip) causing a sore throat or bad breath. Tiredness (fatigue). Fever. How is this diagnosed? This condition is diagnosed based on: Your symptoms. Your  medical history. A physical exam. Tests to find out if your condition is acute or chronic. This may include: Checking your nose for nasal polyps. Viewing your sinuses using a device that has a light (endoscope). Testing for allergies or bacteria. Imaging tests, such as an MRI or CT scan. In rare cases, a bone biopsy may be done to rule out more serious types of fungal sinus disease. How is this treated? Treatment for a sinus infection depends on the cause and whether your condition is chronic or acute. If caused by a virus, your symptoms should go away on their own within 10 days. You may be given medicines to relieve symptoms. They include: Medicines that shrink swollen nasal passages (decongestants). A spray that eases inflammation of the nostrils (topical intranasal corticosteroids). Rinses that help get rid of thick mucus in your nose (nasal saline washes). Medicines that treat allergies (antihistamines). Over-the-counter pain relievers. If caused by bacteria, your health care provider may recommend waiting to see if your symptoms improve. Most bacterial infections will get better without antibiotic medicine. You may be given antibiotics if you have: A severe infection. A weak immune system. If caused by narrow nasal passages or nasal polyps, surgery may be needed. Follow these instructions at home: Medicines Take, use, or apply over-the-counter and prescription medicines only as told by your health care provider. These may include nasal sprays. If you were prescribed an antibiotic medicine, take it as told by your health care provider. Do not stop taking the antibiotic even if you start to feel better. Hydrate and humidify  Drink enough fluid to keep your urine pale yellow. Staying hydrated will help   to thin your mucus. Use a cool mist humidifier to keep the humidity level in your home above 50%. Inhale steam for 10-15 minutes, 3-4 times a day, or as told by your health care  provider. You can do this in the bathroom while a hot shower is running. Limit your exposure to cool or dry air. Rest Rest as much as possible. Sleep with your head raised (elevated). Make sure you get enough sleep each night. General instructions  Apply a warm, moist washcloth to your face 3-4 times a day or as told by your health care provider. This will help with discomfort. Use nasal saline washes as often as told by your health care provider. Wash your hands often with soap and water to reduce your exposure to germs. If soap and water are not available, use hand sanitizer. Do not smoke. Avoid being around people who are smoking (secondhand smoke). Keep all follow-up visits. This is important. Contact a health care provider if: You have a fever. Your symptoms get worse. Your symptoms do not improve within 10 days. Get help right away if: You have a severe headache. You have persistent vomiting. You have severe pain or swelling around your face or eyes. You have vision problems. You develop confusion. Your neck is stiff. You have trouble breathing. These symptoms may be an emergency. Get help right away. Call 911. Do not wait to see if the symptoms will go away. Do not drive yourself to the hospital. Summary A sinus infection is soreness and inflammation of your sinuses. Sinuses are hollow spaces in the bones around your face. This condition is caused by nasal tissues that become inflamed or swollen. The swelling traps or blocks the flow of mucus. This allows bacteria, viruses, and fungi to grow, which leads to infection. If you were prescribed an antibiotic medicine, take it as told by your health care provider. Do not stop taking the antibiotic even if you start to feel better. Keep all follow-up visits. This is important. This information is not intended to replace advice given to you by your health care provider. Make sure you discuss any questions you have with your health  care provider. Document Revised: 08/30/2021 Document Reviewed: 08/30/2021 Elsevier Patient Education  [place]. Acute Bronchitis, Adult  Acute bronchitis is sudden inflammation of the main airways (bronchi) that come off the windpipe (trachea) in the lungs. The swelling causes the airways to get smaller and make more mucus than normal. This can make it hard to breathe and can cause coughing or noisy breathing (wheezing). Acute bronchitis may last several weeks. The cough may last longer. Allergies, asthma, and exposure to smoke may make the condition worse. What are the causes? This condition can be caused by germs and by substances that irritate the lungs, including: Cold and flu viruses. The most common cause of this condition is the virus that causes the common cold. Bacteria. This is less common. Breathing in substances that irritate the lungs, including: Smoke from cigarettes and other forms of tobacco. Dust and pollen. Fumes from household cleaning products, gases, or burned fuel. Indoor or outdoor air pollution. What increases the risk? The following factors may make you more likely to develop this condition: A weak body's defense system, also called the immune system. A condition that affects your lungs and breathing, such as asthma. What are the signs or symptoms? Common symptoms of this condition include: Coughing. This may bring up clear, yellow, or green mucus from your lungs (sputum).  Wheezing. Runny or stuffy nose. Having too much mucus in your lungs (chest congestion). Shortness of breath. Aches and pains, including sore throat or chest. How is this diagnosed? This condition is usually diagnosed based on: Your symptoms and medical history. A physical exam. You may also have other tests, including tests to rule out other conditions, such as pneumonia. These tests include: A test of lung function. Test of a mucus sample to look for the presence of  bacteria. Tests to check the oxygen level in your blood. Blood tests. Chest X-ray. How is this treated? Most cases of acute bronchitis clear up over time without treatment. Your health care provider may recommend: Drinking more fluids to help thin your mucus so it is easier to cough up. Taking inhaled medicine (inhaler) to improve air flow in and out of your lungs. Using a vaporizer or a humidifier. These are machines that add water to the air to help you breathe better. Taking a medicine that thins mucus and clears congestion (expectorant). Taking a medicine that prevents or stops coughing (cough suppressant). It is not common to take an antibiotic medicine for this condition. Follow these instructions at home:  Take over-the-counter and prescription medicines only as told by your health care provider. Use an inhaler, vaporizer, or humidifier as told by your health care provider. Take two teaspoons (10 mL) of honey at bedtime to lessen coughing at night. Drink enough fluid to keep your urine pale yellow. Do not use any products that contain nicotine or tobacco. These products include cigarettes, chewing tobacco, and vaping devices, such as e-cigarettes. If you need help quitting, ask your health care provider. Get plenty of rest. Return to your normal activities as told by your health care provider. Ask your health care provider what activities are safe for you. Keep all follow-up visits. This is important. How is this prevented? To lower your risk of getting this condition again: Wash your hands often with soap and water for at least 20 seconds. If soap and water are not available, use hand sanitizer. Avoid contact with people who have cold symptoms. Try not to touch your mouth, nose, or eyes with your hands. Avoid breathing in smoke or chemical fumes. Breathing smoke or chemical fumes will make your condition worse. Get the flu shot every year. Contact a health care provider if: Your  symptoms do not improve after 2 weeks. You have trouble coughing up the mucus. Your cough keeps you awake at night. You have a fever. Get help right away if you: Cough up blood. Feel pain in your chest. Have severe shortness of breath. Faint or keep feeling like you are going to faint. Have a severe headache. Have a fever or chills that get worse. These symptoms may represent a serious problem that is an emergency. Do not wait to see if the symptoms will go away. Get medical help right away. Call your local emergency services (911 in the [place] Do not drive yourself to the hospital. Summary Acute bronchitis is inflammation of the main airways (bronchi) that come off the windpipe (trachea) in the lungs. The swelling causes the airways to get smaller and make more mucus than normal. Drinking more fluids can help thin your mucus so it is easier to cough up. Take over-the-counter and prescription medicines only as told by your health care provider. Do not use any products that contain nicotine or tobacco. These products include cigarettes, chewing tobacco, and vaping devices, such as e-cigarettes. If you need help  quitting, ask your health care provider. Contact a health care provider if your symptoms do not improve after 2 weeks. This information is not intended to replace advice given to you by your health care provider. Make sure you discuss any questions you have with your health care provider. Document Revised: 01/05/2022 Document Reviewed: 01/26/2021 Elsevier Patient Education  [place], Adult The ears produce a substance called earwax that helps keep bacteria out of the ear and protects the skin in the ear canal. Occasionally, earwax can build up in the ear and cause discomfort or hearing loss. What are the causes? This condition is caused by a buildup of earwax. Ear canals are self-cleaning. Ear wax is made in the outer part of the ear canal and generally falls  out in small amounts over time. When the self-cleaning mechanism is not working, earwax builds up and can cause decreased hearing and discomfort. Attempting to clean ears with cotton swabs can push the earwax deep into the ear canal and cause decreased hearing and pain. What increases the risk? This condition is more likely to develop in people who: Clean their ears often with cotton swabs. Pick at their ears. Use earplugs or in-ear headphones often, or wear hearing aids. The following factors may also make you more likely to develop this condition: Being female. Being of older age. Naturally producing more earwax. Having narrow ear canals. Having earwax that is overly thick or sticky. Having excess hair in the ear canal. Having eczema. Being dehydrated. What are the signs or symptoms? Symptoms of this condition include: Reduced or muffled hearing. A feeling of fullness in the ear or feeling that the ear is plugged. Fluid coming from the ear. Ear pain or an itchy ear. Ringing in the ear. Coughing. Balance problems. An obvious piece of earwax that can be seen inside the ear canal. How is this diagnosed? This condition may be diagnosed based on: Your symptoms. Your medical history. An ear exam. During the exam, your health care provider will look into your ear with an instrument called an otoscope. You may have tests, including a hearing test. How is this treated? This condition may be treated by: Using ear drops to soften the earwax. Having the earwax removed by a health care provider. The health care provider may: Flush the ear with water. Use an instrument that has a loop on the end (curette). Use a suction device. Having surgery to remove the wax buildup. This may be done in severe cases. Follow these instructions at home:  Take over-the-counter and prescription medicines only as told by your health care provider. Do not put any objects, including cotton swabs, into your  ear. You can clean the opening of your ear canal with a washcloth or facial tissue. Follow instructions from your health care provider about cleaning your ears. Do not overclean your ears. Drink enough fluid to keep your urine pale yellow. This will help to thin the earwax. Keep all follow-up visits as told. If earwax builds up in your ears often or if you use hearing aids, consider seeing your health care provider for routine, preventive ear cleanings. Ask your health care provider how often you should schedule your cleanings. If you have hearing aids, clean them according to instructions from the manufacturer and your health care provider. Contact a health care provider if: You have ear pain. You develop a fever. You have pus or other fluid coming from your ear. You have hearing loss. You have  ringing in your ears that does not go away. You feel like the room is spinning (vertigo). Your symptoms do not improve with treatment. Get help right away if: You have bleeding from the affected ear. You have severe ear pain. Summary Earwax can build up in the ear and cause discomfort or hearing loss. The most common symptoms of this condition include reduced or muffled hearing, a feeling of fullness in the ear, or feeling that the ear is plugged. This condition may be diagnosed based on your symptoms, your medical history, and an ear exam. This condition may be treated by using ear drops to soften the earwax or by having the earwax removed by a health care provider. Do not put any objects, including cotton swabs, into your ear. You can clean the opening of your ear canal with a washcloth or facial tissue. This information is not intended to replace advice given to you by your health care provider. Make sure you discuss any questions you have with your health care provider. Document Revised: 01/13/2020 Document Reviewed: 01/13/2020 Elsevier Patient Education  [place].

## 2022-11-07 ENCOUNTER — Encounter: Payer: Self-pay | Admitting: Registered Nurse

## 2022-11-07 ENCOUNTER — Telehealth: Payer: Self-pay | Admitting: Registered Nurse

## 2022-11-07 DIAGNOSIS — J019 Acute sinusitis, unspecified: Secondary | ICD-10-CM

## 2022-11-07 MED ORDER — ALBUTEROL SULFATE HFA 108 (90 BASE) MCG/ACT IN AERS: 1.0000 | INHALATION_SPRAY | RESPIRATORY_TRACT | 0 refills | Status: AC | PRN

## 2022-11-07 NOTE — Telephone Encounter (Signed)
Patient contacted RN Kimrey albuterol inhaler from 2022 needs refill.  Using for dry cough and helping.  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  Denied wheezing/chest pain/fever/chills/n/v/d.  Albuterol inhaler 2 puffs po q4-6h prn protracted coughing.  Nonproductive "but feels like something should come up"  spoke full sentences without difficulty  skin warm dry and pink  Electronic Rx sent to her pharmacy of choice albuterol 14mg/act 1-2 puffs po q4h prn protracted cough/chest tightness/wheezing #1 RF0  Patient to follow up with clinic staff for re-evaluation if new or worsening symptoms.  Patient stated cough worse as cold weather moved back into area after warm weather last week.  Patient agreed with plan of care and had no further questions at this time.

## 2022-11-07 NOTE — Telephone Encounter (Signed)
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  Denied concerns or questions.  Some nasal congestion audible.  Spoke full sentences without difficulty.  Skin warm dry and pink respirations even and unlabored RA.  No cough/throat clearing observed.

## 2022-11-09 ENCOUNTER — Ambulatory Visit: Payer: No Typology Code available for payment source | Admitting: Occupational Medicine

## 2022-11-09 DIAGNOSIS — J019 Acute sinusitis, unspecified: Secondary | ICD-10-CM

## 2022-11-09 DIAGNOSIS — J209 Acute bronchitis, unspecified: Secondary | ICD-10-CM

## 2022-11-09 NOTE — Progress Notes (Signed)
Patient recovering slowly due to cold weather. Still reports nasal congestion and coughing. Did notice coughing up white changed to yellow. Reports the Inhaler is helping some. Still on Abt and prednisone. Will follow up next week. Educated to contact clinic if needed. Pt is breathing even and unlabored. Occasionally SHOB with exertion.

## 2022-11-10 NOTE — Telephone Encounter (Signed)
RN Evlyn Kanner notified NP patient still having rhinitis and cough but no worsening of symptoms at this time and will follow up with patient again next week.  See note in Epic.

## 2022-11-15 ENCOUNTER — Ambulatory Visit: Payer: No Typology Code available for payment source | Admitting: Occupational Medicine

## 2022-11-15 ENCOUNTER — Encounter: Payer: Self-pay | Admitting: Occupational Medicine

## 2022-11-15 DIAGNOSIS — Z0000 Encounter for general adult medical examination without abnormal findings: Secondary | ICD-10-CM

## 2022-11-15 NOTE — Telephone Encounter (Signed)
Patient seen in workcenter.  Stated cough productive phelgm back to clear with some white this week.  Worst at night lying down to bed.  Using inhaler prn.  A&Ox3 spoke full sentences without difficulty.  No cough/throat clearing/nasal congestion observed.  Gait sure and steady.  Respirations even and unlabored RA.  Skin warm dry and pink.  Discussed cold am/pm temperatures causing cold weather vasomotor rhinitis.  Clear mucous typically allergic/vasomotor/viral not bacterial.  Continue plan of care as previously discussed.  Follow up for re-evaluation if tan/opaque mucous/dyspnea/shortness of breath/wheezing not resolved with albuterol, fever or chills.  Patient verbalized understanding information/instructions, agreed with plan of care and had no further questions at that time.

## 2022-11-15 NOTE — Progress Notes (Signed)
[facility] insurance premium discount evaluation:    Patient completed PCM office visit epic reviewed by RN Kimrey and transcribed. Labs  Tobacco attestation signed. [facility] ROI formed signed. Forms placed in the chart.   Patient given handouts for [facility] pharmacies and discount drugs list,MyChart, Tele doc setup, Tele doc Behavioral, [place] counseling and [facility].  What to do for infectious illness protocol. Given handout for list of medications that can be filled at [facility]. Given Clinic hours and Clinic Email.  

## 2022-11-21 ENCOUNTER — Telehealth: Payer: Self-pay | Admitting: Registered Nurse

## 2022-11-21 ENCOUNTER — Encounter: Payer: Self-pay | Admitting: Registered Nurse

## 2022-11-21 DIAGNOSIS — Z8639 Personal history of other endocrine, nutritional and metabolic disease: Secondary | ICD-10-CM

## 2022-11-21 DIAGNOSIS — I10 Essential (primary) hypertension: Secondary | ICD-10-CM

## 2022-11-21 DIAGNOSIS — K219 Gastro-esophageal reflux disease without esophagitis: Secondary | ICD-10-CM

## 2022-11-21 DIAGNOSIS — E1165 Type 2 diabetes mellitus with hyperglycemia: Secondary | ICD-10-CM

## 2022-11-21 NOTE — Telephone Encounter (Signed)
Per paper chart review last fill 09/19/2022 90 day supply each.  108/85 BP 10/31/22 appt [facility] paperwork completed by RN Evlyn Kanner 11/15/22.     Latest Reference Range & Units 09/15/22 11:40  COMPREHENSIVE METABOLIC PANEL  Rpt !  Sodium 135 - 145 mEq/L 140  Potassium 3.5 - 5.1 mEq/L 3.9  Chloride 96 - 112 mEq/L 104  CO2 19 - 32 mEq/L 28  Glucose 70 - 99 mg/dL 121 (H)  BUN 6 - 23 mg/dL 12  Creatinine 0.40 - 1.20 mg/dL 0.70  Calcium 8.4 - 10.5 mg/dL 9.5  Alkaline Phosphatase 39 - 117 U/L 123 (H)  Albumin 3.5 - 5.2 g/dL 4.5  AST 0 - 37 U/L 15  ALT 0 - 35 U/L 17  Total Protein 6.0 - 8.3 g/dL 7.2  Total Bilirubin 0.2 - 1.2 mg/dL 0.9  GFR >60.00 mL/min 95.54  Total CHOL/HDL Ratio  3  Cholesterol 0 - 200 mg/dL 170  HDL Cholesterol >39.00 mg/dL 52.90  LDL (calc) 0 - 99 mg/dL 93  MICROALB/CREAT RATIO 0.0 - 30.0 mg/g 0.7  NonHDL  116.62  Triglycerides 0.0 - 149.0 mg/dL 116.0  VLDL 0.0 - 40.0 mg/dL 23.2  Hemoglobin A1C 4.6 - 6.5 % 7.0 (H)  !: Data is abnormal (H): Data is abnormally high Rpt: View report in Results Review for more information  Magnesium level to be drawn with next labs.  Ordered nonfasting due to omeprazole use.  Hgba1c due Mar 2024 with weight and BP.  On metformin last 7.0 09/15/22

## 2022-11-22 MED ORDER — OMEPRAZOLE 20 MG PO CPDR: 20.0000 mg | DELAYED_RELEASE_CAPSULE | Freq: Every day | ORAL | 0 refills | Status: DC

## 2022-11-22 MED ORDER — AMLODIPINE BESYLATE 10 MG PO TABS: 10.0000 mg | ORAL_TABLET | Freq: Every day | ORAL | 2 refills | Status: DC

## 2022-11-22 NOTE — Telephone Encounter (Signed)
Patient given 90 day supply each amlodipine 19m po daily and omeprazole DR 226mpo daily from [facility] formulary.

## 2022-11-29 ENCOUNTER — Encounter: Payer: Self-pay | Admitting: Registered Nurse

## 2022-11-29 ENCOUNTER — Ambulatory Visit: Payer: No Typology Code available for payment source | Admitting: Occupational Medicine

## 2022-11-29 ENCOUNTER — Telehealth: Payer: Self-pay | Admitting: Registered Nurse

## 2022-11-29 DIAGNOSIS — J019 Acute sinusitis, unspecified: Secondary | ICD-10-CM

## 2022-11-29 MED ORDER — PREDNISONE 10 MG PO TABS: ORAL_TABLET | ORAL | 0 refills | Status: AC

## 2022-11-29 NOTE — Telephone Encounter (Signed)
Patient with worsening sinus pain and pressure yellow clear discharge.  Discussed most likely viral as clear mucous can send in rx for prednisone taper 80m po daily with breakfast 353m3days 2063m days 63m54mdays #18 RF0 electronic rx sent to her pharmacy of choice continue nasal saline 2 sprays each nostril q2h prn congestion.  Consider mask wear at work if heavy dust exposure.  Shower after work.  Hydrate with water.  Patient out of office tomorrow unable to schedule office visit with NP.  Afebrile respirations even and unlabored per RN Kimrey evaluation in clinic today.  Patient agreed with plan of care and had no further questions at this time will follow up with clinic staff if no improvement or worsening of symptoms.  Consider home covid test as still circulating in community.

## 2022-11-29 NOTE — Progress Notes (Signed)
Patient reports sinus are worsen feels like its sinus infection. Patient reports facial pain blowing yellow and clear nasal drainage. Started sudafated Monday and saline spray not improving. Otila Kluver NP made aware ordering prednisone to reduce swelling in the sinus to allow to drain. No abt's needed at this time. Educated to contact clinic if worsening.

## 2022-12-04 ENCOUNTER — Ambulatory Visit: Payer: No Typology Code available for payment source | Admitting: Occupational Medicine

## 2022-12-04 DIAGNOSIS — Z20822 Contact with and (suspected) exposure to covid-19: Secondary | ICD-10-CM

## 2022-12-04 NOTE — Progress Notes (Signed)
Covid test negative today. Patients daughter test positive Friday. She hasn't seen her since Thursday. Patient reports took COVID test Friday was negative. Patient declines any symptoms at this time. Educated will need to re test on Wednesday. After three negative can take off mask.

## 2022-12-06 ENCOUNTER — Other Ambulatory Visit: Payer: No Typology Code available for payment source | Admitting: Occupational Medicine

## 2022-12-06 NOTE — Progress Notes (Signed)
Covid test today negative. Patient denies any symptoms.Patient has had three separate tests negative able to take mask off today.

## 2022-12-12 ENCOUNTER — Other Ambulatory Visit: Payer: No Typology Code available for payment source | Admitting: Occupational Medicine

## 2022-12-13 ENCOUNTER — Other Ambulatory Visit: Payer: No Typology Code available for payment source | Admitting: Occupational Medicine

## 2022-12-13 DIAGNOSIS — Z8639 Personal history of other endocrine, nutritional and metabolic disease: Secondary | ICD-10-CM

## 2022-12-13 DIAGNOSIS — E1165 Type 2 diabetes mellitus with hyperglycemia: Secondary | ICD-10-CM

## 2022-12-13 DIAGNOSIS — K219 Gastro-esophageal reflux disease without esophagitis: Secondary | ICD-10-CM

## 2022-12-13 DIAGNOSIS — I10 Essential (primary) hypertension: Secondary | ICD-10-CM

## 2022-12-13 NOTE — Progress Notes (Signed)
Lab drawn from [facility] tolerated well no issues noted.

## 2022-12-14 LAB — MAGNESIUM: Magnesium: 1.8 mg/dL (ref 1.6–2.3)

## 2022-12-14 LAB — HEMOGLOBIN A1C
Est. average glucose Bld gHb Est-mCnc: 200 mg/dL
Hgb A1c MFr Bld: 8.6 % — ABNORMAL HIGH (ref 4.8–5.6)

## 2022-12-14 LAB — VITAMIN D 25 HYDROXY (VIT D DEFICIENCY, FRACTURES): Vit D, 25-Hydroxy: 41.5 ng/mL (ref 30.0–100.0)

## 2022-12-14 NOTE — Progress Notes (Signed)
My chart message sent to patient Diana Lutz, Your 3 month blood sugar average (Hgba1c) elevated worsening steroids this winter didn't help your blood sugars.  Are you taking your metformin every day?  Avoid dehydration/drink water to keep urine pale yellow and clear.  If you have carbohydrate or sugar heavy meal/snack get some activity e.g. walk, wash dishes, housework, gardening for 10-15 minutes.  Keep added sugars less than 25 grams/100 calories/5 teaspoons per day.  I recommend exercise 150 minutes per week.  Weight loss over the next year.  Magnesium and vitamin D level normal.  Repeat Hgba1c in 3 months.  Scheduled appt with [facility] dietitian it is free.  [facility] ([email or web address])  May need to increase your metformin dose schedule appt with PCM or me to discuss.  Please let us know if you  have further questions. Sincerely, Gerarda Fraction NP-C

## 2022-12-19 NOTE — Telephone Encounter (Signed)
Patient seen in workcenter feeling better.  Denied concerns.  Read my chart message for [facility] test results "My diet and exercise have not been good.  I am going to make some changes and would like to check labs again in 3 months and if not improved then will start recommended medication."  Discussed prednisone had some impact on her Hgba1c levels also.  Agreed with plan of care RN Kimrey notified to schedule labs with patient.  Patient verbalized understanding information and had no further questions at this time.

## 2022-12-19 NOTE — Telephone Encounter (Signed)
Hgba1c appt scheduled 03/21/23 with RN Evlyn Kanner patient refused meds trying diet/exercise for 3 months.

## 2023-01-06 ENCOUNTER — Other Ambulatory Visit: Payer: Self-pay | Admitting: Primary Care

## 2023-01-06 DIAGNOSIS — E119 Type 2 diabetes mellitus without complications: Secondary | ICD-10-CM

## 2023-01-22 ENCOUNTER — Ambulatory Visit: Payer: No Typology Code available for payment source | Admitting: Occupational Medicine

## 2023-01-22 DIAGNOSIS — M25512 Pain in left shoulder: Secondary | ICD-10-CM

## 2023-01-22 NOTE — Progress Notes (Signed)
Patient reports has been having left shoulder pain in the muscle since Saturday. No injury. She thinks might have slept on it wrong. Noted muscle tight. Applied a Thermacare to the area.  Patient given tylenol, ibuprofen, and Biofreeze to use as needed for pain. Scheduled appt with Np tomorrow Am.

## 2023-01-23 ENCOUNTER — Encounter: Payer: Self-pay | Admitting: Registered Nurse

## 2023-01-23 ENCOUNTER — Ambulatory Visit: Payer: No Typology Code available for payment source | Admitting: Registered Nurse

## 2023-01-23 VITALS — BP 142/91 | HR 81

## 2023-01-23 DIAGNOSIS — E1165 Type 2 diabetes mellitus with hyperglycemia: Secondary | ICD-10-CM

## 2023-01-23 DIAGNOSIS — M62830 Muscle spasm of back: Secondary | ICD-10-CM

## 2023-01-23 MED ORDER — CYCLOBENZAPRINE HCL 10 MG PO TABS: 5.0000 mg | ORAL_TABLET | Freq: Three times a day (TID) | ORAL | 0 refills | Status: DC | PRN

## 2023-01-23 MED ORDER — ACETAMINOPHEN 500 MG PO TABS: 1000.0000 mg | ORAL_TABLET | Freq: Four times a day (QID) | ORAL | 0 refills | Status: AC | PRN

## 2023-01-23 MED ORDER — BIOFREEZE 4 % EX GEL: 1.0000 | Freq: Four times a day (QID) | CUTANEOUS | 0 refills | Status: AC | PRN

## 2023-01-23 NOTE — Progress Notes (Signed)
Subjective:    Patient ID: Diana Lutz, female    DOB: 11/22/1965, 57 y.o.   MRN: [id number]  57y/o established caucasian female with left neck/shoulder pain started this weekend after sleeping on it wrong..  Ran out of muscle relaxers at home last filled a couple years ago uses rarely.  Has tried massage, stretching, biofreeze, tylenol and motrin.  Saw RN Kimrey yesterday given thermacare, ibuprofen, tylenol and biofreeze.  Worked whole shift yesterday. Denied arm/leg weakness, loss of bowel/bladder control or trauma.  Lifting product in warehouse worsens pain.  Has tried heating pad helps some.  Denied rash/bruising/fever/chills/headache.        Review of Systems  Constitutional:  Negative for activity change, appetite change, chills, diaphoresis, fatigue and fever.  HENT:  Negative for trouble swallowing and voice change.   Eyes:  Negative for photophobia and visual disturbance.  Respiratory:  Negative for cough, shortness of breath, wheezing and stridor.   Gastrointestinal:  Negative for diarrhea, nausea and vomiting.  Genitourinary:  Negative for enuresis.  Musculoskeletal:  Positive for arthralgias, back pain, myalgias and neck pain. Negative for neck stiffness.  Skin:  Negative for color change, pallor, rash and wound.  Allergic/Immunologic: Negative for food allergies.  Neurological:  Negative for dizziness, tremors, seizures, syncope, facial asymmetry, speech difficulty, weakness, light-headedness, numbness and headaches.  Hematological:  Negative for adenopathy. Does not bruise/bleed easily.  Psychiatric/Behavioral:  Negative for agitation, confusion and sleep disturbance.        Objective:   Physical Exam Vitals and nursing note reviewed.  Constitutional:      General: She is awake. She is not in acute distress.    Appearance: Normal appearance. She is well-developed and well-groomed. She is obese. She is not ill-appearing, toxic-appearing or diaphoretic.  HENT:      Head: Normocephalic and atraumatic.     Jaw: There is normal jaw occlusion.     Salivary Glands: Right salivary gland is not diffusely enlarged. Left salivary gland is not diffusely enlarged.     Right Ear: Hearing and external ear normal.     Left Ear: Hearing and external ear normal.     Nose: Nose normal. No congestion or rhinorrhea.     Mouth/Throat:     Lips: Pink. No lesions.     Mouth: Mucous membranes are moist.     Pharynx: Oropharynx is clear.  Eyes:     General: Lids are normal. Vision grossly intact. Gaze aligned appropriately. No scleral icterus.       Right eye: No discharge.        Left eye: No discharge.     Extraocular Movements: Extraocular movements intact.     Conjunctiva/sclera: Conjunctivae normal.     Pupils: Pupils are equal, round, and reactive to light.  Neck:     Trachea: Trachea and phonation normal. No tracheal deviation.     Comments: Left trapezius tighter than right/TTP; bilateral shoulder arom equal negative neers/atchley scratch/liftoff/empty beer can/internal/external rotation pain/decreased AROM   Cardiovascular:     Rate and Rhythm: Normal rate and regular rhythm.     Pulses: Normal pulses.          Radial pulses are 2+ on the right side and 2+ on the left side.  Pulmonary:     Effort: Pulmonary effort is normal.     Breath sounds: Normal breath sounds and air entry. No stridor or transmitted upper airway sounds. No wheezing, rhonchi or rales.     Comments: Spoke full  sentences without difficulty; no cough observed in exam room Abdominal:     Palpations: Abdomen is soft.  Musculoskeletal:        General: Tenderness present. No swelling, deformity or signs of injury. Normal range of motion.     Right shoulder: No swelling, deformity, effusion, laceration, bony tenderness or crepitus. Normal range of motion. Normal strength.     Left shoulder: Tenderness present. No swelling, deformity, effusion, laceration, bony tenderness or crepitus. Normal  range of motion. Normal strength.     Right elbow: No swelling, deformity or effusion. Normal range of motion.     Left elbow: No swelling, deformity or effusion. Normal range of motion.     Right hand: Normal strength. Normal capillary refill.     Left hand: Normal strength. Normal capillary refill.     Cervical back: Normal range of motion and neck supple. Spasms and tenderness present. No swelling, edema, deformity, erythema, signs of trauma, lacerations, rigidity, torticollis, bony tenderness or crepitus. Pain with movement and muscular tenderness present. No spinous process tenderness. Normal range of motion.     Thoracic back: Spasms and tenderness present. No swelling, edema, deformity, signs of trauma, lacerations or bony tenderness. Normal range of motion. No scoliosis.     Lumbar back: No swelling, edema, deformity, signs of trauma or lacerations. Normal range of motion.       Back:  Lymphadenopathy:     Head:     Right side of head: No submandibular or preauricular adenopathy.     Left side of head: No submandibular or preauricular adenopathy.     Cervical: No cervical adenopathy.     Right cervical: No superficial or posterior cervical adenopathy.    Left cervical: No superficial or posterior cervical adenopathy.  Skin:    General: Skin is warm and dry.     Capillary Refill: Capillary refill takes less than 2 seconds.     Coloration: Skin is not ashen, cyanotic, jaundiced, mottled, pale or sallow.     Findings: No abrasion, abscess, acne, bruising, burn, ecchymosis, erythema, signs of injury, laceration, lesion, petechiae, rash or wound.     Nails: There is no clubbing.  Neurological:     General: No focal deficit present.     Mental Status: She is alert and oriented to person, place, and time. Mental status is at baseline.     GCS: GCS eye subscore is 4. GCS verbal subscore is 5. GCS motor subscore is 6.     Cranial Nerves: Cranial nerves 2-12 are intact. No cranial nerve  deficit, dysarthria or facial asymmetry.     Sensory: Sensation is intact.     Motor: Motor function is intact. No weakness, tremor, atrophy, abnormal muscle tone or seizure activity.     Coordination: Coordination is intact. Coordination normal.     Gait: Gait is intact. Gait normal.     Comments: In/out of chair without difficulty; gait sure and steady in clinic; bilateral hand grasp equal 5/5; normal heel toe gait  Psychiatric:        Attention and Perception: Attention and perception normal.        Mood and Affect: Mood and affect normal.        Speech: Speech normal.        Behavior: Behavior normal. Behavior is cooperative.        Thought Content: Thought content normal.        Cognition and Memory: Cognition and memory normal.  Judgment: Judgment normal.      Latest Reference Range & Units 09/15/22 11:40 12/13/22 12:33  COMPREHENSIVE METABOLIC PANEL  Rpt !   Sodium 135 - 145 mEq/L 140   Potassium 3.5 - 5.1 mEq/L 3.9   Chloride 96 - 112 mEq/L 104   CO2 19 - 32 mEq/L 28   Glucose 70 - 99 mg/dL 161 (H)   BUN 6 - 23 mg/dL 12   Creatinine 0.96 - 1.20 mg/dL 0.45   Calcium 8.4 - 40.9 mg/dL 9.5   Magnesium 1.6 - 2.3 mg/dL  1.8  Alkaline Phosphatase 39 - 117 U/L 123 (H)   Albumin 3.5 - 5.2 g/dL 4.5   AST 0 - 37 U/L 15   ALT 0 - 35 U/L 17   Total Protein 6.0 - 8.3 g/dL 7.2   Total Bilirubin 0.2 - 1.2 mg/dL 0.9   GFR >81.19 mL/min 95.54   Total CHOL/HDL Ratio  3   Cholesterol 0 - 200 mg/dL 147   HDL Cholesterol >82.95 mg/dL 62.13   LDL (calc) 0 - 99 mg/dL 93   MICROALB/CREAT RATIO 0.0 - 30.0 mg/g 0.7   NonHDL  116.62   Triglycerides 0.0 - 149.0 mg/dL 086.5   VLDL 0.0 - 78.4 mg/dL 69.6   Vitamin D, 29-BMWUXLK 30.0 - 100.0 ng/mL  41.5  Hemoglobin A1C 4.8 - 5.6 % 7.0 (H) 8.6 (H)  Est. average glucose Bld gHb Est-mCnc mg/dL  440  !: Data is abnormal (H): Data is abnormally high Rpt: View report in Results Review for more information      Assessment & Plan:    A-neck/back muscle spasm, type 2 diabetes hyperglycemia without long term insulin use  P-cyclobenazeprine/flexeril  sig t1/2-1 po TID prn muscle spasms #30 RF0 dispensed from [facility].  Ibuprofen  po TID prn pain has at home along with tylenol  po q6h prn pain.  Biofreeze topical gel QID prn pain  given UD from clinic stock.   Avoid alcohol intake and driving while taking cyclobenazeprine/flexeril as drowsiness common side effect.  Slow position changes as medication also lower blood pressure.  Home stretches demonstrated to patient-e.g. Arm circles, walking up wall, chest stretches, neck AROM, chin tucks, knee to chest and rock side to side on back. Self massage or professional prn, foam roller use or tennis/racquetball.  Heat/cryotherapy 15 minutes QID prn.  Trial thermacare 1 applied and another given to patient for use tomorrow from clinic stock.  Consider physical therapy referral if no improvement with prescribed therapy from [facility] and/or chiropractic care.  Ensure ergonomics correct desk at work avoid repetitive motions if possible/holding phone/laptop in hand use desk/stand and/or break up lifting items into smaller loads/weights.  Will need to see PCM if work restrictions needed this was not a work injury.  I am unable to write work restrictions in this clinic due to contract limitations.  Patient was instructed to rest, ice, and ROM exercises.  Activity as tolerated.   Follow up if symptoms persist or worsen especially if loss of bowel/bladder control, arm/leg weakness and/or saddle paresthesias.  [facility]care handouts on muscle spasms and neck/back/shoulder rehab exercises given to patient.  Patient verbalized agreement and understanding of treatment plan and had no further questions at this time.  P2:  Injury Prevention and Fitness.   [employer] dietitian visits available for free.  Patient working on diet/exercise.  Following up with PCM.  Hgba1c worsening on last check has appt scheduled for  recheck 6/12 RN Kimrey and [facility] 6/11

## 2023-01-23 NOTE — Patient Instructions (Signed)
Cervical Strain and Sprain Rehab Ask your health care provider which exercises are safe for you. Do exercises exactly as told by your health care provider and adjust them as directed. It is normal to feel mild stretching, pulling, tightness, or discomfort as you do these exercises. Stop right away if you feel sudden pain or your pain gets worse. Do not begin these exercises until told by your health care provider. Stretching and range-of-motion exercises Cervical side bending  Using good posture, sit on a stable chair or stand up. Without moving your shoulders, slowly tilt your left / right ear to your shoulder until you feel a stretch in the neck muscles on the opposite side. You should be looking straight ahead. Hold for _____30_____ seconds. Repeat with the other side of your neck. Repeat ____3______ times. Complete this exercise _____2_____ times a day. Cervical rotation  Using good posture, sit on a stable chair or stand up. Slowly turn your head to the side as if you are looking over your left / right shoulder. Keep your eyes level with the ground. Stop when you feel a stretch along the side and the back of your neck. Hold for ___30_______ seconds. Repeat this by turning to your other side. Repeat _____3_____ times. Complete this exercise ____2______ times a day. Thoracic extension and pectoral stretch  Roll a towel or a small blanket so it is about 4 inches (10 cm) in diameter. Lie down on your back on a firm surface. Put the towel in the middle of your back across your spine. It should not be under your shoulder blades. Put your hands behind your head and let your elbows fall out to your sides. Hold for ____30______ seconds. Repeat ___3_______ times. Complete this exercise _____2_____ times a day. Strengthening exercises Upper cervical flexion  Lie on your back with a thin pillow behind your head or a small, rolled-up towel under your neck. Gently tuck your chin toward your  chest and nod your head down to look toward your feet. Do not lift your head off the pillow. Hold for ____30______ seconds. Release the tension slowly. Relax your neck muscles completely before you repeat this exercise. Repeat _____3_____ times. Complete this exercise ___2_______ times a day. Cervical extension  Stand about 6 inches (15 cm) away from a wall, with your back facing the wall. Place a soft object, about 6-8 inches (15-20 cm) in diameter, between the back of your head and the wall. A soft object could be a small pillow, a ball, or a folded towel. Gently tilt your head back and press into the soft object. Keep your jaw and forehead relaxed. Hold for ____30______ seconds. Release the tension slowly. Relax your neck muscles completely before you repeat this exercise. Repeat ____3______ times. Complete this exercise ____2______ times a day. Posture and body mechanics Body mechanics refer to the movements and positions of your body while you do your daily activities. Posture is part of body mechanics. Good posture and healthy body mechanics can help to relieve stress in your body's tissues and joints. Good posture means that your spine is in its natural S-curve position (your spine is neutral), your shoulders are pulled back slightly, and your head is not tipped forward. The following are general guidelines for using improved posture and body mechanics in your everyday activities. Sitting  When sitting, keep your spine neutral and keep your feet flat on the floor. Use a footrest, if needed, and keep your thighs parallel to the floor. Avoid rounding  your shoulders. Avoid tilting your head forward. When working at a desk or a computer, keep your desk at a height where your hands are slightly lower than your elbows. Slide your chair under your desk so you are close enough to maintain good posture. When working at a computer, place your monitor at a height where you are looking straight ahead  and you do not have to tilt your head forward or downward to look at the screen. Standing  When standing, keep your spine neutral and keep your feet about hip-width apart. Keep a slight bend in your knees. Your ears, shoulders, and hips should line up. When you do a task in which you stand in one place for a long time, place one foot up on a stable object that is 2-4 inches (5-10 cm) high, such as a footstool. This helps keep your spine neutral. Resting When lying down and resting, avoid positions that are most painful for you. Try to support your neck in a neutral position. You can use a contour pillow or a small rolled-up towel. Your pillow should support your neck but not push on it. This information is not intended to replace advice given to you by your health care provider. Make sure you discuss any questions you have with your health care provider. Document Revised: 04/17/2022 Document Reviewed: 04/17/2022 [employer] Patient Education  2023 [employer] Thoracic Strain Rehab Ask your health care provider which exercises are safe for you. Do exercises exactly as told by your provider and adjust them as directed. It is normal to feel mild stretching, pulling, tightness, or discomfort as you do these exercises. Stop right away if you feel sudden pain or your pain gets worse. Do not begin these exercises until told by your provider. Stretching and range-of-motion exercise This exercise warms up your muscles and joints and improves the movement and flexibility of your back and shoulders. This exercise also helps to relieve pain. Chest and spine stretch  Lie down on your back on a firm surface. Roll a towel or a small blanket so it is about 4 inches (10 cm) in diameter. Put the towel under the middle of your back so it is under your spine, but not under your shoulder blades. Put your hands behind your head and let your elbows fall to your sides. This will increase your stretch. Take a deep breath  (inhale). Hold for __30________ seconds. Relax after you breathe out (exhale). Repeat ____3______ times. Complete this exercise _____2_____ times a day. Strengthening exercises These exercises build strength and endurance in your back and your shoulder blade muscles. Endurance is the ability to use your muscles for a long time, even after they get tired. Alternating arm and leg raises  Get on your hands and knees on a firm surface. If you are on a hard floor, you may want to use padding, such as an exercise mat, to cushion your knees. Line up your arms and legs. Your hands should be directly below your shoulders, and your knees should be directly below your hips. Lift your left leg behind you. At the same time, raise your right arm and straighten it in front of you. Do not lift your leg higher than your hip. Do not lift your arm higher than your shoulder. Keep your abdominal and back muscles tight. Keep your hips facing the ground. Do not arch your back. Carefully stay balanced. Do not hold your breath. Hold for _____30_____ seconds. Slowly return to the starting position  and repeat with your right leg and your left arm. Repeat ______3____ times. Complete this exercise ______2____ times a day. Straight arm rows This exercise is also called the shoulder extension exercise. Stand with your feet shoulder width apart. Secure an exercise band to a stable object in front of you so the band is at or above shoulder height. Hold one end of the exercise band in each hand. Straighten your elbows and lift your hands up to shoulder height. Step back, away from the secured end of the exercise band, until the band stretches. Squeeze your shoulder blades together and pull your hands down to the sides of your thighs. Stop when your hands are straight down by your sides. This is shoulder extension. Do not let your hands go behind your body. Hold for ____30______ seconds. Slowly return to the starting  position. Repeat _____3_____ times. Complete this exercise ____2______ times a day. Rowing scapular retraction This is an exercise in which the shoulder blades (scapulae) are pulled toward each other (retraction). Sit in a stable chair without armrests, or stand up. Secure an exercise band to a stable object in front of you so the band is at shoulder height. Hold one end of the exercise band in each hand. Your palms should face toward each other. Bring your arms out straight in front of you. Step back, away from the secured end of the exercise band, until the band stretches. Pull the band backward. As you do this, bend your elbows and squeeze your shoulder blades together, but avoid letting the rest of your body move. Do not shrug your shoulders upward while you do this. Stop when your elbows are at your sides or slightly behind your body. Hold for _____30_____ seconds. Slowly straighten your arms to return to the starting position. Repeat ____3______ times. Complete this exercise _____twice a day Shoulder Exercises Ask your health care provider which exercises are safe for you. Do exercises exactly as told by your health care provider and adjust them as directed. It is normal to feel mild stretching, pulling, tightness, or discomfort as you do these exercises. Stop right away if you feel sudden pain or your pain gets worse. Do not begin these exercises until told by your health care provider. Stretching exercises External rotation and abduction This exercise is sometimes called corner stretch. The exercise rotates your arm outward (external rotation) and moves your arm out from your body (abduction). Stand in a doorway with one of your feet slightly in front of the other. This is called a staggered stance. If you cannot reach your forearms to the door frame, stand facing a corner of a room. Choose one of the following positions as told by your health care provider: Place your hands and forearms  on the door frame above your head. Place your hands and forearms on the door frame at the height of your head. Place your hands on the door frame at the height of your elbows. Slowly move your weight onto your front foot until you feel a stretch across your chest and in the front of your shoulders. Keep your head and chest upright and keep your abdominal muscles tight. Hold for ___30_______ seconds. To release the stretch, shift your weight to your back foot. Repeat ____3______ times. Complete this exercise ____2______ times a day. Extension, standing  Stand and hold a broomstick, a cane, or a similar object behind your back. Your hands should be a little wider than shoulder-width apart. Your palms should face away  from your back. Keeping your elbows straight and your shoulder muscles relaxed, move the stick away from your body until you feel a stretch in your shoulders (extension). Avoid shrugging your shoulders while you move the stick. Keep your shoulder blades tucked down toward the middle of your back. Hold for _____30_____ seconds. Slowly return to the starting position. Repeat ____3______ times. Complete this exercise _____2_____ times a day. Range-of-motion exercises Pendulum  Stand near a wall or a surface that you can hold onto for balance. Bend at the waist and let your left / right arm hang straight down. Use your other arm to support you. Keep your back straight and do not lock your knees. Relax your left / right arm and shoulder muscles, and move your hips and your trunk so your left / right arm swings freely. Your arm should swing because of the motion of your body, not because you are using your arm or shoulder muscles. Keep moving your hips and trunk so your arm swings in the following directions, as told by your health care provider: Side to side. Forward and backward. In clockwise and counterclockwise circles. Continue each motion for _____30_____ seconds, or for as long  as told by your health care provider. Slowly return to the starting position. Repeat ____3______ times. Complete this exercise ___2_______ times a day. Shoulder flexion, standing  Stand and hold a broomstick, a cane, or a similar object. Place your hands a little more than shoulder-width apart on the object. Your left / right hand should be palm-up, and your other hand should be palm-down. Keep your elbow straight and your shoulder muscles relaxed. Push the stick up with your healthy arm to raise your left / right arm in front of your body, and then over your head until you feel a stretch in your shoulder (flexion). Avoid shrugging your shoulder while you raise your arm. Keep your shoulder blade tucked down toward the middle of your back. Hold for ___30_______ seconds. Slowly return to the starting position. Repeat _____3_____ times. Complete this exercise ______2____ times a day. Shoulder abduction, standing  Stand and hold a broomstick, a cane, or a similar object. Place your hands a little more than shoulder-width apart on the object. Your left / right hand should be palm-up, and your other hand should be palm-down. Keep your elbow straight and your shoulder muscles relaxed. Push the object across your body toward your left / right side. Raise your left / right arm to the side of your body (abduction) until you feel a stretch in your shoulder. Do not raise your arm above shoulder height unless your health care provider tells you to do that. If directed, raise your arm over your head. Avoid shrugging your shoulder while you raise your arm. Keep your shoulder blade tucked down toward the middle of your back. Hold for ___30______ seconds. Slowly return to the starting position. Repeat _____3_____ times. Complete this exercise ______2____ times a day. Internal rotation  Place your left / right hand behind your back, palm-up. Use your other hand to dangle an exercise band, a broomstick, or a  similar object over your shoulder. Grasp the band with your left / right hand so you are holding on to both ends. Gently pull up on the band until you feel a stretch in the front of your left / right shoulder. The movement of your arm toward the center of your body is called internal rotation. Avoid shrugging your shoulder while you raise your arm. Keep  your shoulder blade tucked down toward the middle of your back. Hold for ____30______ seconds. Release the stretch by letting go of the band and lowering your hands. Repeat _______3___ times. Complete this exercise ___2_______ times a day. Strengthening exercises External rotation  Sit in a stable chair without armrests. Secure an exercise band to a stable object at elbow height on your left / right side. Place a soft object, such as a folded towel or a small pillow, between your left / right upper arm and your body to move your elbow about 4 inches (10 cm) away from your side. Hold the end of the exercise band so it is tight and there is no slack. Keeping your elbow pressed against the soft object, slowly move your forearm out, away from your abdomen (external rotation). Keep your body steady so only your forearm moves. Hold for ___30_______ seconds. Slowly return to the starting position. Repeat _____3_____ times. Complete this exercise _____2_____ times a day. Shoulder abduction  Sit in a stable chair without armrests, or stand up. Hold a ____none______ lb / kg weight in your left / right hand, or hold an exercise band with both hands. Start with your arms straight down and your left / right palm facing in, toward your body. Slowly lift your left / right hand out to your side (abduction). Do not lift your hand above shoulder height unless your health care provider tells you that this is safe. Keep your arms straight. Avoid shrugging your shoulder while you do this movement. Keep your shoulder blade tucked down toward the middle of your  back. Hold for _____30_____ seconds. Slowly lower your arm, and return to the starting position. Repeat ___3_______ times. Complete this exercise ____2______ times a day. Shoulder extension  Sit in a stable chair without armrests, or stand up. Secure an exercise band to a stable object in front of you so it is at shoulder height. Hold one end of the exercise band in each hand. Straighten your elbows and lift your hands up to shoulder height. Squeeze your shoulder blades together as you pull your hands down to the sides of your thighs (extension). Stop when your hands are straight down by your sides. Do not let your hands go behind your body. Hold for ____30______ seconds. Slowly return to the starting position. Repeat ____3______ times. Complete this exercise ___2_______ times a day. Shoulder row  Sit in a stable chair without armrests, or stand up. Secure an exercise band to a stable object in front of you so it is at chest height. Hold one end of the exercise band in each hand. Position your palms so that your thumbs are facing the ceiling (neutral position). Bend each of your elbows to a 90-degree angle (right angle) and keep your upper arms at your sides. Step back or move the chair back until the band is tight and there is no slack. Slowly pull your elbows back behind you. Hold for ____30______ seconds. Slowly return to the starting position. Repeat ______3____ times. Complete this exercise ____2______ times a day. Shoulder press-ups  Sit in a stable chair that has armrests. Sit upright, with your feet flat on the floor. Put your hands on the armrests so your elbows are bent and your fingers are pointing forward. Your hands should be about even with the sides of your body. Push down on the armrests and use your arms to lift yourself off the chair. Straighten your elbows and lift yourself up as much as you  comfortably can. Move your shoulder blades down, and avoid letting your  shoulders move up toward your ears. Keep your feet on the ground. As you get stronger, your feet should support less of your body weight as you lift yourself up. Hold for ___30_______ seconds. Slowly lower yourself back into the chair. Repeat ____3______ times. Complete this exercise ______2____ times a day. Wall push-ups  Stand so you are facing a stable wall. Your feet should be about one arm-length away from the wall. Lean forward and place your palms on the wall at shoulder height. Keep your feet flat on the floor as you bend your elbows and lean forward toward the wall. Hold for ____30______ seconds. Straighten your elbows to push yourself back to the starting position. Repeat ______3____ times. Complete this exercise _____2_____ times a day. This information is not intended to replace advice given to you by your health care provider. Make sure you discuss any questions you have with your health care provider. Document Revised: 11/15/2021 Document Reviewed: 11/15/2021 [employer] Patient Education  2023 [employer] _____ times a day. Posture and body mechanics Good posture and healthy body mechanics can help to relieve stress in your body's tissues and joints. Body mechanics refers to the movements and positions of your body while you do your daily activities. Posture is part of body mechanics. Good posture means: Your spine is in its natural S-curve position (neutral). Your shoulders are pulled back slightly. Your head is not tipped forward. Follow these guidelines to improve your posture and body mechanics in your everyday activities. Standing  When standing, keep your spine neutral and your feet about hip width apart. Keep a slight bend in your knees. Your ears, shoulders, and hips should line up with each other. When you do a task in which you lean forward while standing in one place for a long time, place one foot up on a stable object that is 2-4 inches (5-10 cm) high, such as a  footstool. This helps keep your spine neutral. Sitting  When sitting, keep your spine neutral and keep your feet flat on the floor. Use a footrest if needed. Keep your thighs parallel to the floor. Avoid rounding your shoulders, and avoid tilting your head forward. When working at a desk or a computer, keep your desk at a height where your hands are slightly lower than your elbows. Slide your chair under your desk so you are close enough to maintain good posture. When working at a computer, place your monitor at a height where you are looking straight ahead and you do not have to tilt your head forward or downward to look at the screen. Resting When lying down and resting, avoid positions that are most painful for you. If you have pain with activities such as sitting, bending, stooping, or squatting (flexion-basedactivities), lie in a position in which your body does not bend very much. For example, avoid curling up on your side with your arms and knees near your chest (fetal position). If you have pain with activities such as standing for a long time or reaching with your arms (extension-basedactivities), lie with your spine in a neutral position and bend your knees slightly. Try the following positions: Lie on your side with a pillow between your knees. Lie on your back with a pillow under your knees.  Lifting  When lifting objects, keep your feet at least shoulder width apart and tighten your abdominal muscles. Bend your knees and hips and keep your spine neutral.  It is important to lift using the strength of your legs, not your back. Do not lock your knees straight out. Always ask for help to lift heavy or awkward objects. This information is not intended to replace advice given to you by your health care provider. Make sure you discuss any questions you have with your health care provider. Document Revised: 05/15/2022 Document Reviewed: 05/15/2022 [employer] Patient Education  2023 [employer] Muscle Cramps and Spasms Muscle cramps and spasms occur when a muscle or muscles tighten and you have no control over this tightening (involuntary muscle contraction). They are a common problem that can happen in any muscle. The most common place is in the calf muscles of the leg. There are a few ways that muscle cramps and spasms differ: Muscle cramps are painful. They come and go and may last for a few seconds or up to 15 minutes. Muscle cramps are often more forceful and last longer than muscle spasms. Muscle spasms may or may not be painful. They may last just a few seconds or last much longer. Certain conditions, such as diabetes or Parkinson's disease, can make you more likely to have cramps or spasms. But in most cases, cramps and spasms are not caused by other conditions. Common causes include: Overexertion. This is when you do more physical work or exercise than your body is ready for. Overuse from doing the same movements too many times. Staying in one position for too long. Improper preparation, form, or technique when playing a sport or doing an activity. Not enough water or other fluids in your body (dehydration). Other causes may include: Injury. Side effects of some medicines. Too few salts and minerals in your body (electrolytes), such as potassium and calcium. This could happen if you are taking water pills (diuretics) or if you are pregnant. In many cases, the cause of muscle cramps or spasms is not known. Follow these instructions at home: Eating and drinking Drink enough fluid to keep your pee (urine) pale yellow. This can help prevent cramps or spasms. Eat a healthy diet that includes a lot of nutrients to help your muscles work. A healthy diet includes fruits and vegetables, lean protein, whole grains, and low-fat or nonfat dairy products. Managing pain and stiffness     Try to massage, stretch, and relax the affected muscle. Do this for a few minutes at a time. If  told, put ice on the muscles. This may help if you are sore or have pain after a cramp or spasm. Put ice in a plastic bag. Place a towel between your skin and the bag. Leave the ice on for 20 minutes, 2-3 times a day. If told, apply heat to tight or tense muscles as often as told by your health care provider. Use the heat source that your provider recommends, such as a moist heat pack or a heating pad. Place a towel between your skin and the heat source. Leave the heat on for 20-30 minutes. If your skin turns bright red, remove the ice or heat right away to prevent skin damage. The risk of damage is higher if you cannot feel pain, heat, or cold. Take hot showers or baths to help relax tight muscles. General instructions If you are having cramps often, avoid intense exercise for a few days. Take over-the-counter and prescription medicines only as told by your provider. Watch for any changes in your symptoms. Contact a health care provider if: Your cramps or spasms get more severe or  happen more often. Your cramps or spasms do not get better over time. This information is not intended to replace advice given to you by your health care provider. Make sure you discuss any questions you have with your health care provider. Document Revised: 05/16/2022 Document Reviewed: 05/16/2022 Elsevier Patient Education  2023 [employer].

## 2023-01-24 ENCOUNTER — Encounter: Payer: Self-pay | Admitting: Occupational Medicine

## 2023-01-25 NOTE — Telephone Encounter (Signed)
Patient reports its not gone but improving.

## 2023-03-19 ENCOUNTER — Telehealth: Payer: Self-pay | Admitting: Occupational Medicine

## 2023-03-19 NOTE — Telephone Encounter (Signed)
Patient reports ate a sandwich Saturday Sunday morning started having Diarrhea Low Fever Cramps Chills. Today is improving but still having diarrhea and mild cramps. Covid test negative.  I have recommended clear fluids and bland diet.  Avoid dairy/spicy, fried and large portions of meat while having diarrhea  If vomiting hold po intake x 1 hour.  Then sips clear fluids like broths, ginger ale, power ade, gatorade, pedialyte may advance to soft/bland if no vomiting x 24 hours and appetite returned otherwise hydration main focus.     Avoid dairy, spicy and fried foods until diarrhea resolves. Avoid dehydration drink noncaffeinated beverages (water, ginger ale, soup broth, popsicles, no sugar added gatorade/powerade) to urinate every 2-4 hours pale yellow urine.  It is easy to become dehydrated when having diarrhea along with electrolyte imbalances.  Return to the clinic if symptoms persist or worsen; I have alerted the patient to call if high fever, dehydration, marked weakness, fainting, increased abdominal pain, blood in stool or vomit. Patient to stay at home due to infectious disease policy. HR NP and Supervisor made aware not able to return till 24 hrs free of symptoms. Patient verbalized agreement and understanding of treatment plan and had no further questions at this time.

## 2023-03-20 ENCOUNTER — Encounter: Payer: Self-pay | Admitting: Primary Care

## 2023-03-20 ENCOUNTER — Ambulatory Visit: Payer: No Typology Code available for payment source | Admitting: Primary Care

## 2023-03-20 VITALS — BP 126/82 | HR 96 | Temp 98.1°F | Ht 67.0 in | Wt 232.0 lb

## 2023-03-20 DIAGNOSIS — Z7984 Long term (current) use of oral hypoglycemic drugs: Secondary | ICD-10-CM | POA: Diagnosis not present

## 2023-03-20 DIAGNOSIS — Z23 Encounter for immunization: Secondary | ICD-10-CM | POA: Diagnosis not present

## 2023-03-20 DIAGNOSIS — E1165 Type 2 diabetes mellitus with hyperglycemia: Secondary | ICD-10-CM

## 2023-03-20 LAB — POCT GLYCOSYLATED HEMOGLOBIN (HGB A1C): Hemoglobin A1C: 8.8 % — AB (ref 4.0–5.6)

## 2023-03-20 MED ORDER — METFORMIN HCL ER 500 MG PO TB24
500.0000 mg | ORAL_TABLET | Freq: Every day | ORAL | 0 refills | Status: DC
Start: 2023-03-20 — End: 2023-05-22

## 2023-03-20 NOTE — Progress Notes (Signed)
Noted PCM increased metformin dosing to 1000mg  and encouraged patient to schedule appt with [facility] dieititian

## 2023-03-20 NOTE — Patient Instructions (Signed)
We increased the dose of your metformin to 2 tablets every morning with breakfast.  Schedule some sessions with your nutritionist.   Increase physical activity.   Please schedule a follow up visit for 3 months.  It was a pleasure to see you today!

## 2023-03-20 NOTE — Telephone Encounter (Signed)
Patient reports feeling better. No Diarrhea, vomiting, nausea since yesterday. Able to RTW in am. Supervisor NP and Hr made aware.

## 2023-03-20 NOTE — Progress Notes (Signed)
Subjective:    Patient ID: Diana Lutz, female    DOB: [birth year], 57 y.o.   MRN: [id number]  HPI  Diana Lutz is a very pleasant 57 y.o. female with a history of type 2 diabetes, hypertension, OSA, hyperlipidemia who presents today for follow-up of diabetes.   She is also due for her second Shingrix vaccine.  Current medications include: Metformin XR 500 mg daily  Last A1C: 8.6 in March 2024 Last Eye Exam: Due Last Foot Exam: Due Pneumonia Vaccination: 2021 Urine Microalbumin: UTD Statin: Rosuvastatin  Dietary changes since last visit: She has been cutting back on starches. She's increased intake of water. She does have access to a nutritionist through her employer for free. She has not looked into this.    Exercise: She is walking more.   BP Readings from Last 3 Encounters:  03/20/23 126/82  01/23/23 (!) 142/91  10/31/22 108/85       Review of Systems  Eyes:  Negative for visual disturbance.  Respiratory:  Negative for shortness of breath.   Cardiovascular:  Negative for chest pain.  Neurological:  Negative for dizziness and numbness.         Past Medical History:  Diagnosis Date   Abnormal Pap smear of cervix 1991   Acute right-sided thoracic back pain 05/17/2021   Anemia    history of anemia   Diabetes mellitus without complication (HCC)    Dysplasia of cervix    cervical ca   GERD (gastroesophageal reflux disease)    Hx of adenomatous polyp of colon 05/30/2020   Hypertension    Migraines    NAFLD (nonalcoholic fatty liver disease) 08/05/2535   Right upper quadrant abdominal pain 06/15/2021    Social History   Socioeconomic History   Marital status: Married    Spouse name: Not on file   Number of children: Not on file   Years of education: Not on file   Highest education level: Associate degree: occupational, Scientist, product/process development, or vocational program  Occupational History   Not on file  Tobacco Use   Smoking status: Never   Smokeless  tobacco: Never  Vaping Use   Vaping Use: Never used  Substance and Sexual Activity   Alcohol use: Not Currently    Alcohol/week: 0.0 standard drinks of alcohol    Comment: social   Drug use: No   Sexual activity: Not Currently  Other Topics Concern   Not on file  Social History Narrative   Married.   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   Enjoys four wheeling, working on the house   Social Determinants of Health   Financial Resource Strain: Low Risk  (03/19/2023)   Overall Financial Resource Strain (CARDIA)    Difficulty of Paying Living Expenses: Not hard at all  Food Insecurity: No Food Insecurity (03/19/2023)   Hunger Vital Sign    Worried About Running Out of Food in the Last Year: Never true    Ran Out of Food in the Last Year: Never true  Transportation Needs: No Transportation Needs (03/19/2023)   PRAPARE - Administrator, Civil Service (Medical): No    Lack of Transportation (Non-Medical): No  Physical Activity: Insufficiently Active (03/19/2023)   Exercise Vital Sign    Days of Exercise per Week: 3 days    Minutes of Exercise per Session: 30 min  Stress: No Stress Concern Present (03/19/2023)   [employer] of Occupational Health - Occupational Stress Questionnaire  Feeling of Stress : Not at all  Social Connections: Socially Integrated (03/19/2023)   Social Connection and Isolation Panel [NHANES]    Frequency of Communication with Friends and Family: More than three times a week    Frequency of Social Gatherings with Friends and Family: More than three times a week    Attends Religious Services: More than 4 times per year    Active Member of [employer] or Organizations: Yes    Attends Engineer, structural: More than 4 times per year    Marital Status: Married  Catering manager Violence: Not on file    Past Surgical History:  Procedure Laterality Date   ANTERIOR AND POSTERIOR REPAIR N/A 08/18/2015   Procedure: ANTERIOR  (CYSTOCELE) AND POSTERIOR REPAIR (RECTOCELE)/Perineorrhaphy;  Surgeon: Shea Evans, MD;  Location: [facility];  Service: Gynecology;  Laterality: N/A;  2 hrs.   BLADDER SUSPENSION N/A 08/18/2015   Procedure: TRANSVAGINAL TAPE (TVT) Mid-Urethral Sling PROCEDURE;  Surgeon: Shea Evans, MD;  Location: [facility];  Service: Gynecology;  Laterality: N/A;   COLONOSCOPY  11/15/2005   normal-Dr.Gessner   CYSTOSCOPY N/A 08/18/2015   Procedure: CYSTOSCOPY;  Surgeon: Shea Evans, MD;  Location: [facility];  Service: Gynecology;  Laterality: N/A;   HYSTEROSCOPY W/ ENDOMETRIAL ABLATION     LASER ABLATION OF THE CERVIX     WISDOM TOOTH EXTRACTION      Family History  Problem Relation Age of Onset   Hypertension Mother    Diverticulosis Mother    Colon polyps Mother    Hypertension Father    Stroke Father    Heart attack Father    Prostate cancer Father    Bladder Cancer Father    Hypertension Brother    Diabetes Brother    Colon cancer Maternal Grandmother 89   Esophageal cancer Neg Hx    Rectal cancer Neg Hx    Stomach cancer Neg Hx    Breast cancer Neg Hx     No Known Allergies  Current Outpatient Medications on File Prior to Visit  Medication Sig Dispense Refill   amLODipine (NORVASC) 10 MG tablet Take 1 tablet (10 mg total) by mouth daily. For blood pressure 90 tablet 3   calcium carbonate (TUMS - DOSED IN MG ELEMENTAL CALCIUM) 500 MG chewable tablet Chew 2 tablets by mouth daily as needed for indigestion or heartburn.     [employer] LUBRICANT EYE DROPS 0.4-0.3 % SOLN APPLY 1 DROP TO EYE 3 (THREE) TIMES DAILY.  12   cyclobenzaprine (FLEXERIL) 10 MG tablet Take 0.5-1 tablets (5-10 mg total) by mouth 3 (three) times daily as needed for muscle spasms. 30 tablet 0   D3-50 1.25 MG (50000 UT) capsule Take 50,000 Units by mouth once a week.     ibuprofen (ADVIL,MOTRIN) 600 MG tablet Take 600 mg by mouth daily as needed.     lisinopril (ZESTRIL) 10 MG tablet Take 10 mg by mouth daily.     losartan (COZAAR)  100 MG tablet TAKE 1 TABLET BY MOUTH EVERY DAY FOR BLOOD PRESSURE 90 tablet 3   rosuvastatin (CRESTOR) 5 MG tablet Take 1 tablet (5 mg total) by mouth every evening. For cholesterol. 90 tablet 3   albuterol (VENTOLIN HFA) 108 (90 Base) MCG/ACT inhaler Inhale 1-2 puffs into the lungs every 4 (four) hours as needed for wheezing or shortness of breath. 1 each 0   fluticasone (FLONASE) 50 MCG/ACT nasal spray PLACE 1 SPRAY INTO BOTH NOSTRILS 2 (TWO) TIMES DAILY 48 mL 2  omeprazole (PRILOSEC) 20 MG capsule Take 1 capsule (20 mg total) by mouth daily. 90 capsule 0   sodium chloride (OCEAN) 0.65 % SOLN nasal spray Place 2 sprays into both nostrils every 2 (two) hours while awake.  0   No current facility-administered medications on file prior to visit.    BP 126/82   Pulse 96   Temp 98.1 F (36.7 C) (Temporal)   Ht 5\' 7"  (1.702 m)   Wt 232 lb (105.2 kg)   SpO2 98%   BMI 36.34 kg/m  Objective:   Physical Exam Cardiovascular:     Rate and Rhythm: Normal rate and regular rhythm.  Pulmonary:     Effort: Pulmonary effort is normal.     Breath sounds: Normal breath sounds.  Musculoskeletal:     Cervical back: Neck supple.  Skin:    General: Skin is warm and dry.           Assessment & Plan:  Type 2 diabetes mellitus with hyperglycemia, without long-term current use of insulin (HCC) Assessment & Plan: Uncontrolled and worse with A1C today of 8.8!  Discussed her overall lifestyle. She has access to a nutritionist through her employer for free. She will inquire. Increase physical activity. Work on diet.  Increase metformin XR to 1000 mg in AM, new Rx sent to pharmacy.  Follow up in 3 months. Foot exam today.  Orders: -     POCT glycosylated hemoglobin (Hb A1C) -     metFORMIN HCl ER; Take 1 tablet (500 mg total) by mouth daily with breakfast. for diabetes.  Dispense: 180 tablet; Refill: 0        Doreene Nest, NP

## 2023-03-20 NOTE — Assessment & Plan Note (Signed)
Uncontrolled and worse with A1C today of 8.8!  Discussed her overall lifestyle. She has access to a nutritionist through her employer for free. She will inquire. Increase physical activity. Work on diet.  Increase metformin XR to 1000 mg in AM, new Rx sent to pharmacy.  Follow up in 3 months. Foot exam today.

## 2023-03-21 ENCOUNTER — Other Ambulatory Visit: Payer: No Typology Code available for payment source | Admitting: Occupational Medicine

## 2023-03-21 IMAGING — MG MM DIGITAL SCREENING BILAT W/ TOMO AND CAD
8 series · 8 of 24 positions shown · non-contrast
Comparison: Previous exam(s).

CLINICAL DATA: Screening.

EXAM:
DIGITAL SCREENING BILATERAL MAMMOGRAM WITH TOMOSYNTHESIS AND CAD
TECHNIQUE: Bilateral screening digital craniocaudal and mediolateral oblique
mammograms were obtained. Bilateral screening digital breast
tomosynthesis was performed. The images were evaluated with
computer-aided detection.

[R MLO synth-2D]
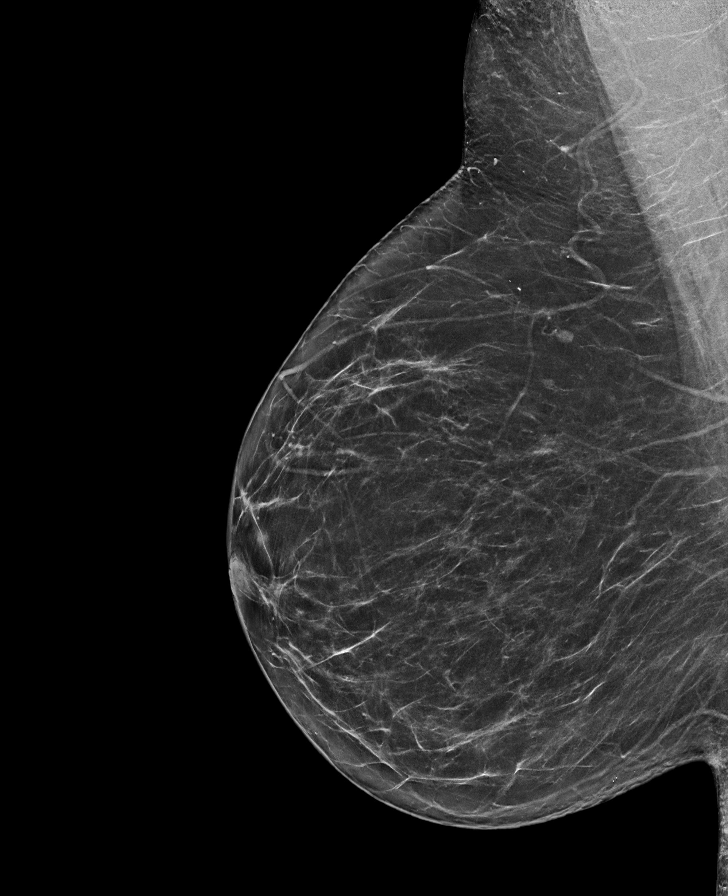

[L CC synth-2D]
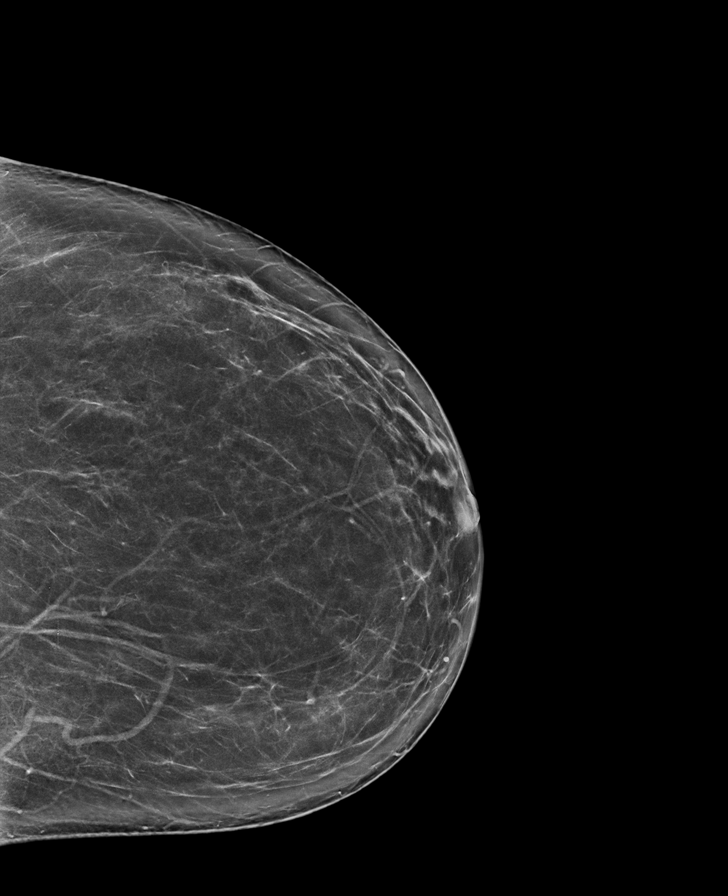

[L MLO synth-2D]
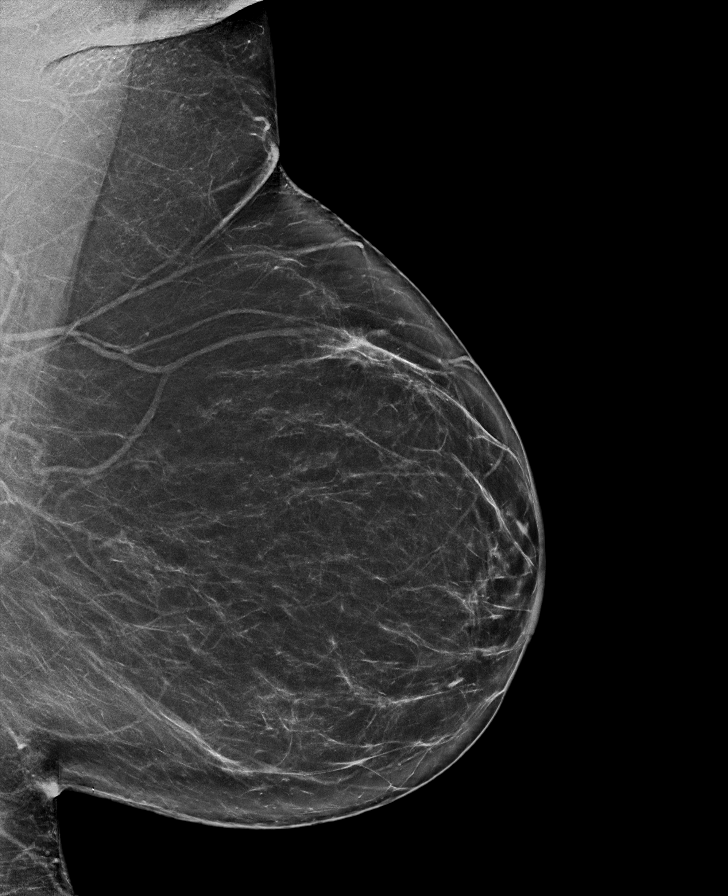

[R CC synth-2D]
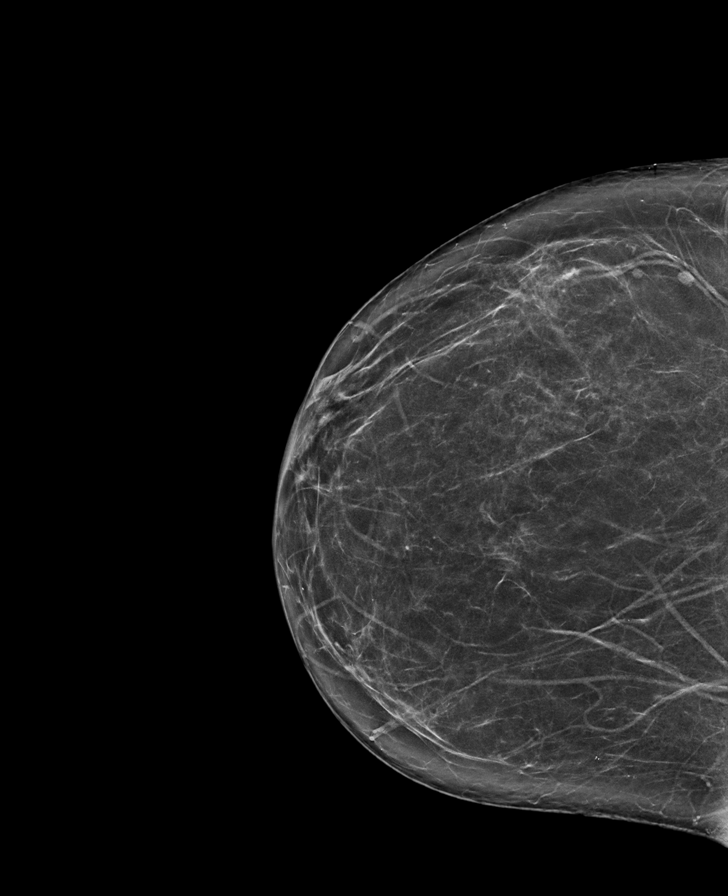

[L MLO tomo · tomo slice 41/82.0]
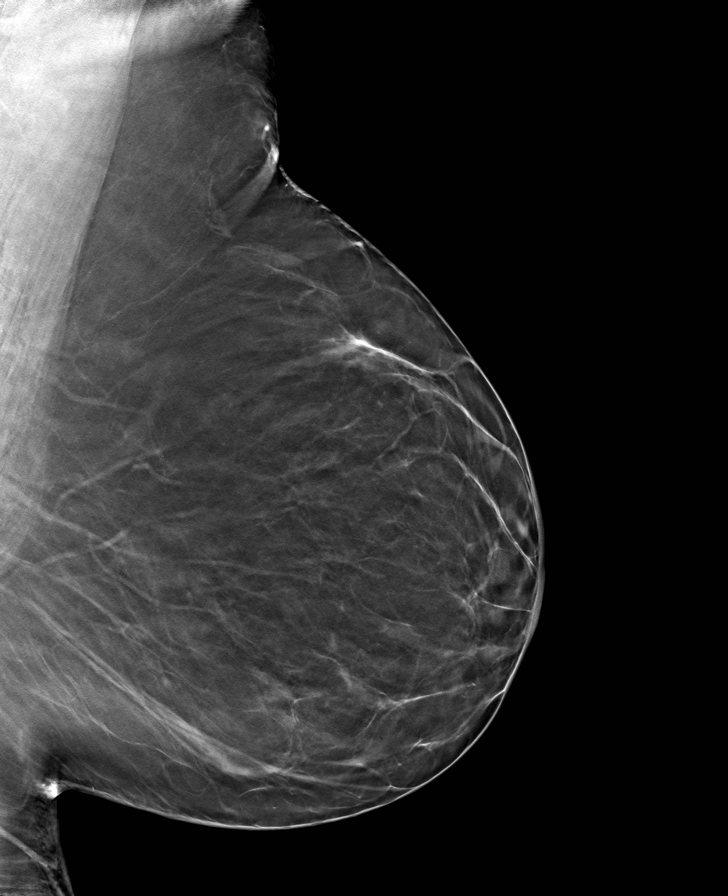

[R CC tomo · tomo slice 39/78.0]
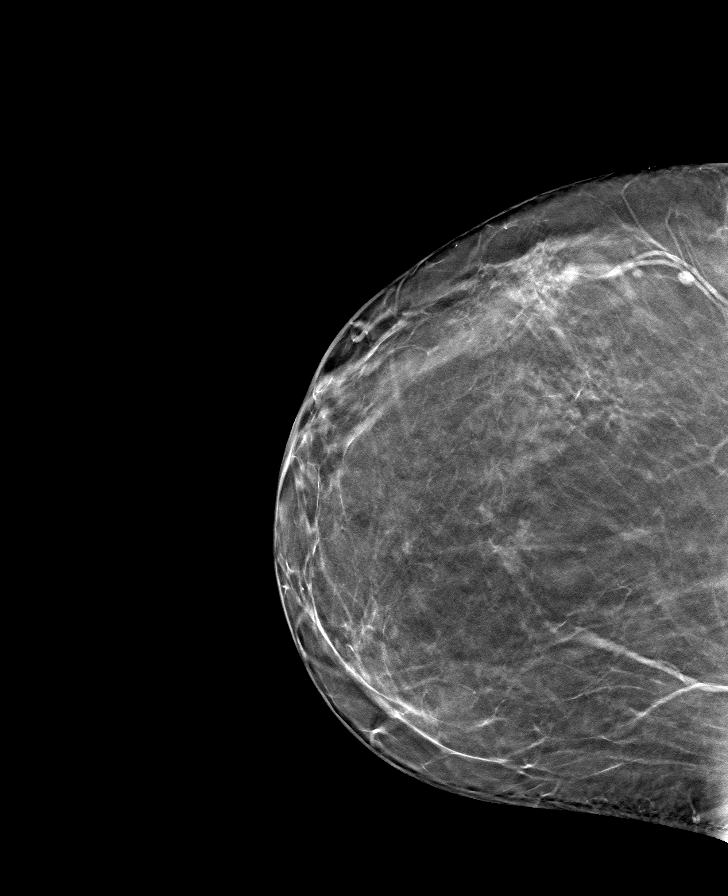

[L CC tomo · tomo slice 41/82.0]
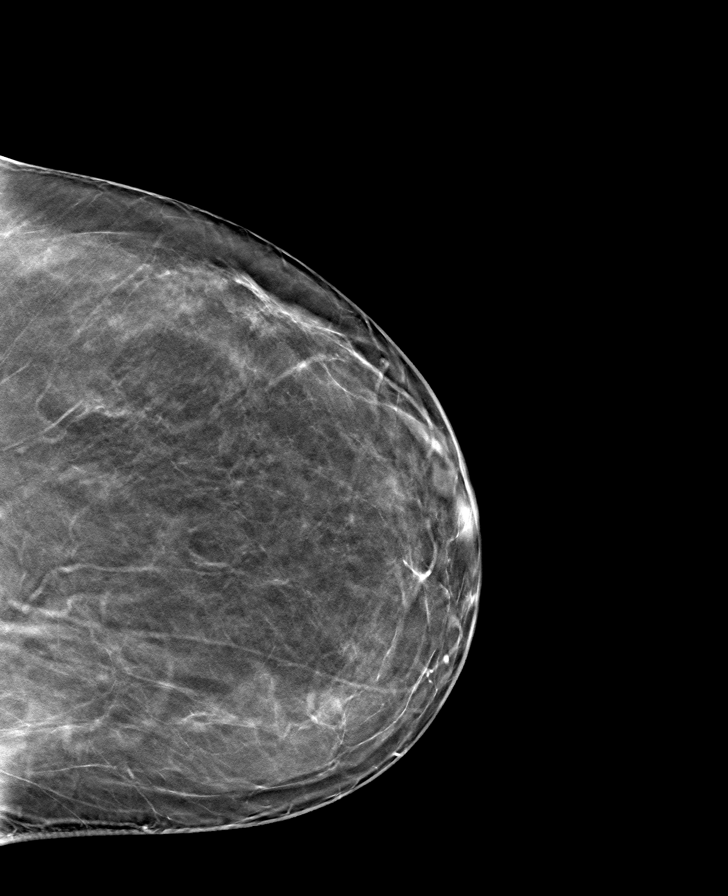

[R MLO tomo · tomo slice 41/80.0]
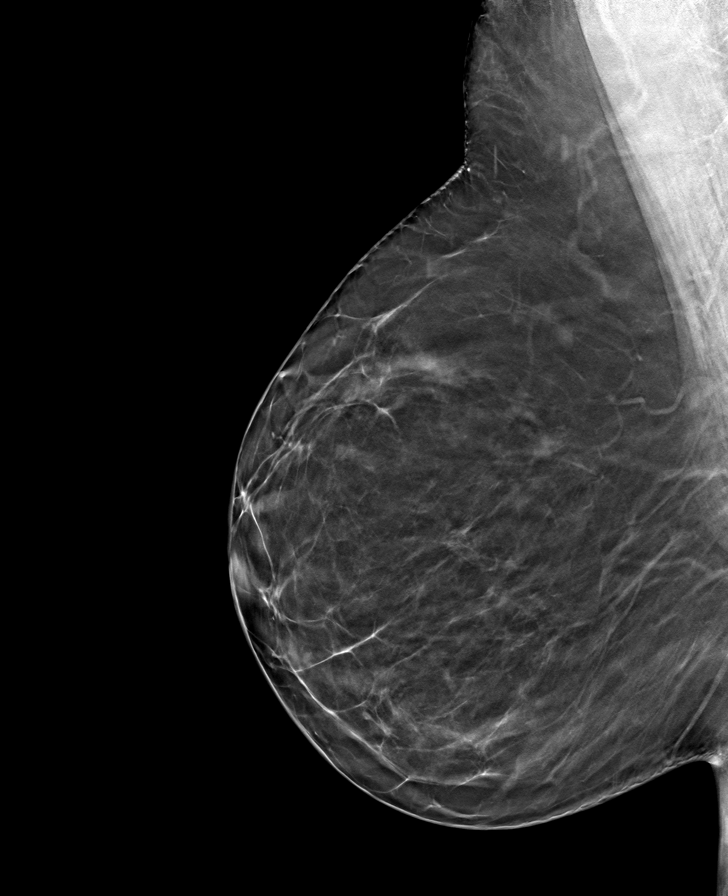

[8 of 24 positions shown; findings below may reference images not displayed]

ACR Breast Density Category b: There are scattered areas of
fibroglandular density.
FINDINGS: There are no findings suspicious for malignancy.
IMPRESSION: No mammographic evidence of malignancy. A result letter of this
screening mammogram will be mailed directly to the patient.

RECOMMENDATION:
Screening mammogram in one year. (Code:51-O-LD2)

BI-RADS CATEGORY  1: Negative.

## 2023-03-25 NOTE — Progress Notes (Signed)
noted 

## 2023-03-29 ENCOUNTER — Encounter: Payer: Self-pay | Admitting: Registered Nurse

## 2023-03-29 ENCOUNTER — Telehealth: Payer: Self-pay | Admitting: Registered Nurse

## 2023-03-29 DIAGNOSIS — K219 Gastro-esophageal reflux disease without esophagitis: Secondary | ICD-10-CM

## 2023-03-29 DIAGNOSIS — I10 Essential (primary) hypertension: Secondary | ICD-10-CM

## 2023-03-29 MED ORDER — AMLODIPINE BESYLATE 10 MG PO TABS: 10.0000 mg | ORAL_TABLET | Freq: Every day | ORAL | 2 refills | Status: DC

## 2023-03-29 MED ORDER — OMEPRAZOLE 20 MG PO CPDR: 20.0000 mg | DELAYED_RELEASE_CAPSULE | Freq: Every day | ORAL | 2 refills | Status: DC

## 2023-03-29 NOTE — Telephone Encounter (Signed)
Patient requested refill amlodipine 10mg  po daily and omeprazole 20mg  po daily DR last dispensed 11/21/2022 90 day supply  Last labs see below next due December 2024.  PCM visit 03/20/23 BP 126/82 HR 96  Given 90 day supply from [facility] today.     Latest Reference Range & Units 09/15/22 11:40 12/13/22 12:33  COMPREHENSIVE METABOLIC PANEL  Rpt !   Sodium 135 - 145 mEq/L 140   Potassium 3.5 - 5.1 mEq/L 3.9   Chloride 96 - 112 mEq/L 104   CO2 19 - 32 mEq/L 28   Glucose 70 - 99 mg/dL 161 (H)   BUN 6 - 23 mg/dL 12   Creatinine 0.96 - 1.20 mg/dL 0.45   Calcium 8.4 - 40.9 mg/dL 9.5   Magnesium 1.6 - 2.3 mg/dL  1.8  Alkaline Phosphatase 39 - 117 U/L 123 (H)   Albumin 3.5 - 5.2 g/dL 4.5   AST 0 - 37 U/L 15   ALT 0 - 35 U/L 17   Total Protein 6.0 - 8.3 g/dL 7.2   Total Bilirubin 0.2 - 1.2 mg/dL 0.9   GFR >81.19 mL/min 95.54   Total CHOL/HDL Ratio  3   Cholesterol 0 - 200 mg/dL 147   HDL Cholesterol >82.95 mg/dL 62.13   LDL (calc) 0 - 99 mg/dL 93   MICROALB/CREAT RATIO 0.0 - 30.0 mg/g 0.7   NonHDL  116.62   Triglycerides 0.0 - 149.0 mg/dL 086.5   VLDL 0.0 - 78.4 mg/dL 69.6   Vitamin D, 29-BMWUXLK 30.0 - 100.0 ng/mL  41.5  Hemoglobin A1C 4.8 - 5.6 % 7.0 (H) 8.6 (H)  Est. average glucose Bld gHb Est-mCnc mg/dL  440  !: Data is abnormal (H): Data is abnormally high Rpt: View report in Results Review for more information

## 2023-03-30 ENCOUNTER — Ambulatory Visit: Payer: No Typology Code available for payment source

## 2023-05-04 NOTE — Progress Notes (Signed)
Noted metformin increased by PCM to 1000mg  will reinforce [facility] dietitian visit with patient

## 2023-05-22 DIAGNOSIS — E1165 Type 2 diabetes mellitus with hyperglycemia: Secondary | ICD-10-CM

## 2023-05-22 MED ORDER — METFORMIN HCL ER 500 MG PO TB24
1000.0000 mg | ORAL_TABLET | Freq: Every day | ORAL | 0 refills | Status: DC
Start: 2023-05-22 — End: 2023-08-13

## 2023-06-02 ENCOUNTER — Encounter: Payer: Self-pay | Admitting: Registered Nurse

## 2023-06-02 ENCOUNTER — Other Ambulatory Visit: Payer: Self-pay | Admitting: Registered Nurse

## 2023-06-02 DIAGNOSIS — Z8639 Personal history of other endocrine, nutritional and metabolic disease: Secondary | ICD-10-CM

## 2023-06-02 MED ORDER — D3 5000 125 MCG (5000 UT) PO CAPS: 5000.0000 [IU] | ORAL_CAPSULE | Freq: Every day | ORAL | 3 refills | Status: AC

## 2023-06-02 NOTE — Telephone Encounter (Signed)
Latest Reference Range & Units 07/17/16 10:15 03/10/22 10:42 06/06/22 09:50 09/07/22 14:05 12/13/22 12:33  Vitamin D, 25-Hydroxy 30.0 - 100.0 ng/mL 20.7 (L) 23.8 (L) 29.5 (L) 58.4 41.5  (L): Data is abnormally low  Patient in normal range with 50,000 units weekly now.  Decrease dose to 5000 units po daily #90 RF3 electronic rx sent to her pharmacy of choice.  No further vitamin D levels at [facility] due to new national guidelines.  Follow up with PCM annually for vitamin D.  My chart message sent to patient.

## 2023-06-19 ENCOUNTER — Other Ambulatory Visit: Payer: No Typology Code available for payment source

## 2023-06-20 ENCOUNTER — Other Ambulatory Visit: Payer: No Typology Code available for payment source

## 2023-06-23 ENCOUNTER — Other Ambulatory Visit: Payer: Self-pay | Admitting: Primary Care

## 2023-06-23 DIAGNOSIS — E1165 Type 2 diabetes mellitus with hyperglycemia: Secondary | ICD-10-CM

## 2023-06-24 NOTE — Telephone Encounter (Signed)
Patient is due for CPE/follow up in December after 09/16/23, this will be required prior to any further refills.  Please schedule, thank you!

## 2023-06-25 NOTE — Telephone Encounter (Signed)
Patient has been scheduled

## 2023-07-17 ENCOUNTER — Telehealth: Payer: Self-pay | Admitting: Registered Nurse

## 2023-07-17 ENCOUNTER — Encounter: Payer: Self-pay | Admitting: Registered Nurse

## 2023-07-17 DIAGNOSIS — I10 Essential (primary) hypertension: Secondary | ICD-10-CM

## 2023-07-17 DIAGNOSIS — K219 Gastro-esophageal reflux disease without esophagitis: Secondary | ICD-10-CM

## 2023-07-17 DIAGNOSIS — E785 Hyperlipidemia, unspecified: Secondary | ICD-10-CM

## 2023-07-17 MED ORDER — SALINE SPRAY 0.65 % NA SOLN: 2.0000 | NASAL | Status: DC

## 2023-07-17 MED ORDER — FLUTICASONE PROPIONATE 50 MCG/ACT NA SUSP: 1.0000 | Freq: Two times a day (BID) | NASAL | 2 refills | Status: AC

## 2023-07-17 MED ORDER — ROSUVASTATIN CALCIUM 5 MG PO TABS
5.0000 mg | ORAL_TABLET | Freq: Every day | ORAL | Status: DC
Start: 2023-07-17 — End: 2024-08-26

## 2023-07-17 NOTE — Telephone Encounter (Signed)
Per chart review patient last filled 90 day supply amlodipine and omeprazole 20mg  03/29/23   amlodipine 10mg  po daily and omeprazole 20mg  po daily DR last dispensed 11/21/2022 90 day supply  Last labs see below next due December 2024.  PCM visit 03/20/23 BP 126/82 HR 96  Given 90 day supply from [facility] today.       Latest Reference Range & Units 09/15/22 11:40 12/13/22 12:33  COMPREHENSIVE METABOLIC PANEL   Rpt !    Sodium 135 - 145 mEq/L 140    Potassium 3.5 - 5.1 mEq/L 3.9    Chloride 96 - 112 mEq/L 104    CO2 19 - 32 mEq/L 28    Glucose 70 - 99 mg/dL 161 (H)    BUN 6 - 23 mg/dL 12    Creatinine 0.96 - 1.20 mg/dL 0.45    Calcium 8.4 - 40.9 mg/dL 9.5    Magnesium 1.6 - 2.3 mg/dL   1.8  Alkaline Phosphatase 39 - 117 U/L 123 (H)    Albumin 3.5 - 5.2 g/dL 4.5    AST 0 - 37 U/L 15    ALT 0 - 35 U/L 17    Total Protein 6.0 - 8.3 g/dL 7.2    Total Bilirubin 0.2 - 1.2 mg/dL 0.9    GFR >81.19 mL/min 95.54    Total CHOL/HDL Ratio   3    Cholesterol 0 - 200 mg/dL 147    HDL Cholesterol >39.00 mg/dL 82.95    LDL (calc) 0 - 99 mg/dL 93    MICROALB/CREAT RATIO 0.0 - 30.0 mg/g 0.7    NonHDL   116.62    Triglycerides 0.0 - 149.0 mg/dL 621.3    VLDL 0.0 - 08.6 mg/dL 57.8    Vitamin D, 46-NGEXBMW 30.0 - 100.0 ng/mL   41.5  Hemoglobin A1C 4.8 - 5.6 % 7.0 (H) 8.6 (H)  Est. average glucose Bld gHb Est-mCnc mg/dL   413  !: Data is abnormal (H): Data is abnormally high Rpt: View report in Results Review for more information

## 2023-07-17 NOTE — Telephone Encounter (Signed)
Patient requested to start filling rosuvastatin 5mg  po daily #90 at [facility].  90 day supply dispensed to patient today next labs due Dec 2024   Latest Reference Range & Units 09/15/22 11:40  COMPREHENSIVE METABOLIC PANEL  Rpt !  Sodium 135 - 145 mEq/L 140  Potassium 3.5 - 5.1 mEq/L 3.9  Chloride 96 - 112 mEq/L 104  CO2 19 - 32 mEq/L 28  Glucose 70 - 99 mg/dL 914 (H)  BUN 6 - 23 mg/dL 12  Creatinine 7.82 - 9.56 mg/dL 2.13  Calcium 8.4 - 08.6 mg/dL 9.5  Alkaline Phosphatase 39 - 117 U/L 123 (H)  Albumin 3.5 - 5.2 g/dL 4.5  AST 0 - 37 U/L 15  ALT 0 - 35 U/L 17  Total Protein 6.0 - 8.3 g/dL 7.2  Total Bilirubin 0.2 - 1.2 mg/dL 0.9  GFR >57.84 mL/min 95.54  Total CHOL/HDL Ratio  3  Cholesterol 0 - 200 mg/dL 696  HDL Cholesterol >29.52 mg/dL 84.13  LDL (calc) 0 - 99 mg/dL 93  MICROALB/CREAT RATIO 0.0 - 30.0 mg/g 0.7  NonHDL  116.62  Triglycerides 0.0 - 149.0 mg/dL 244.0  VLDL 0.0 - 10.2 mg/dL 72.5  !: Data is abnormal (H): Data is abnormally high Rpt: View report in Results Review for more information

## 2023-07-18 ENCOUNTER — Telehealth: Payer: Self-pay | Admitting: Registered Nurse

## 2023-07-18 DIAGNOSIS — S20219A Contusion of unspecified front wall of thorax, initial encounter: Secondary | ICD-10-CM

## 2023-07-18 DIAGNOSIS — W010XXA Fall on same level from slipping, tripping and stumbling without subsequent striking against object, initial encounter: Secondary | ICD-10-CM

## 2023-07-18 NOTE — Telephone Encounter (Signed)
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  Denied trouble breathing/arm/leg weakness.  Stated feeling better today than yesterday pain improved denied concerns or need for pain medications/appt in clinic today.  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  Discussed with patient follow up with clinic staff if new symptoms or concerns this week for re-evaluation.  Clinic closed Wednesday and Fri-Sun this week.  Discussed NP onsite Tuesday today 9-12 and Thur 11-2.  RN onsite Thursday 8a-5p.  Patient verbalized understanding information/instructions, agreed with plan of care and had no further questions at this time.  HR Tonya notified patient required no further intervention/appts at this time.  Incident report received from supervisor Marvis Repress 07/16/23 and reviewed.

## 2023-07-19 ENCOUNTER — Telehealth: Payer: Self-pay | Admitting: Registered Nurse

## 2023-07-19 DIAGNOSIS — E1165 Type 2 diabetes mellitus with hyperglycemia: Secondary | ICD-10-CM

## 2023-07-19 NOTE — Telephone Encounter (Signed)
Last A1c 8.8 PCM office metformin increased by PCM and instructed to follow up in 3 months office visit and repeat Hgba1c overdue at this time.  My chart message reminder sent to patient may have lab drawn [facility] or PCM office and encouraged to schedule follow up appt with PCM.

## 2023-08-13 ENCOUNTER — Other Ambulatory Visit: Payer: Self-pay | Admitting: Primary Care

## 2023-08-13 DIAGNOSIS — E1165 Type 2 diabetes mellitus with hyperglycemia: Secondary | ICD-10-CM

## 2023-08-13 NOTE — Telephone Encounter (Signed)
No follow up scheduled until December

## 2023-10-05 ENCOUNTER — Other Ambulatory Visit: Payer: Self-pay | Admitting: Primary Care

## 2023-10-05 ENCOUNTER — Encounter: Payer: No Typology Code available for payment source | Admitting: Primary Care

## 2023-10-05 DIAGNOSIS — I10 Essential (primary) hypertension: Secondary | ICD-10-CM

## 2023-10-11 ENCOUNTER — Ambulatory Visit: Payer: No Typology Code available for payment source | Admitting: Registered Nurse

## 2023-10-11 ENCOUNTER — Encounter: Payer: Self-pay | Admitting: Registered Nurse

## 2023-10-11 VITALS — BP 130/80 | HR 106 | Temp 98.0°F

## 2023-10-11 DIAGNOSIS — L247 Irritant contact dermatitis due to plants, except food: Secondary | ICD-10-CM

## 2023-10-11 MED ORDER — PREDNISONE 10 MG PO TABS: ORAL_TABLET | ORAL | Status: AC

## 2023-10-11 MED ORDER — AQUAPHOR EX OINT: TOPICAL_OINTMENT | CUTANEOUS | Status: DC | PRN

## 2023-10-11 MED ORDER — DIPHENHYDRAMINE HCL 2 % EX GEL: 1.0000 | Freq: Two times a day (BID) | CUTANEOUS | Status: AC | PRN

## 2023-10-11 NOTE — Patient Instructions (Addendum)
 Contact Dermatitis Dermatitis is redness, soreness, and swelling (inflammation) of the skin. Contact dermatitis is a reaction to certain substances that touch the skin. There are two types of this condition: Irritant contact dermatitis. This is the most common type. It happens when something irritates your skin, such as when your hands get dry from washing them too often with soap. You can get this type of reaction even if you have not been exposed to the irritant before. Allergic contact dermatitis. This type is caused by a substance that you are allergic to, such as poison ivy. It occurs when you have been exposed to the substance (allergen) and form a sensitivity to it. In some cases, the reaction may start soon after your first exposure to the allergen. In other cases, it may not start until you are exposed to the allergen again. It may then occur every time you are exposed to the allergen in the future. What are the causes? Irritant contact dermatitis is often caused by exposure to: Makeup. Soaps, detergents, and bleaches. Acids. Metal salts, such as nickel. Allergic contact dermatitis is often caused by exposure to: Poisonous plants. Chemicals. Jewelry. Latex. Medicines. Preservatives in products, such as clothes. What increases the risk? You are more likely to get this condition if you have: A job that exposes you to irritants or allergens. Certain medical conditions. These include asthma and eczema. What are the signs or symptoms? Symptoms of this condition may occur in any place on your body that has been touched by the irritant. Symptoms include: Dryness, flaking, or cracking. Redness. Itching. Pain or a burning feeling. Blisters. Drainage of small amounts of blood or clear fluid from skin cracks. With allergic contact dermatitis, there may also be swelling in areas such as the eyelids, mouth, or genitals. How is this diagnosed? This condition is diagnosed with a medical  history and physical exam. A patch skin test may be done to help figure out the cause. If the condition is related to your job, you may need to see an expert in health problems in the workplace (occupational medicine specialist). How is this treated? This condition is treated by staying away from the cause of the reaction and protecting your skin from further contact. Treatment may also include: Steroid creams or ointments. Steroid medicines may need be taken by mouth (orally) in more severe cases. Antibiotics or medicines applied to the skin to kill bacteria (antibacterial ointments). These may be needed if a skin infection is present. Antihistamines. These may be taken orally or put on as a lotion to ease itching. A bandage (dressing). Follow these instructions at home: Skin care Moisturize your skin as needed. Put cool, wet cloths (cool compresses) on the affected areas. Try applying baking soda paste to your skin. Stir water into baking soda until it has the consistency of a paste. Do not scratch your skin. Avoid friction to the affected area. Avoid the use of soaps, perfumes, and dyes. Check the affected areas every day for signs of infection. Check for: More redness, swelling, or pain. More fluid or blood. Warmth. Pus or a bad smell. Medicines Take or apply over-the-counter and prescription medicines only as told by your health care provider. If you were prescribed antibiotics, take or apply them as told by your health care provider. Do not stop using the antibiotic even if you start to feel better. Bathing Try taking a bath with: Epsom salts. Follow the instructions on the packaging. You can get these at your local pharmacy  or grocery store. Baking soda. Pour a small amount into the bath as told by your health care provider. Colloidal oatmeal. Follow the instructions on the packaging. You can get this at your local pharmacy or grocery store. Bathe less often. This may mean bathing  every other day. Bathe in lukewarm water. Avoid using hot water. Bandage care If you were given a dressing, change it as told by your health care provider. Wash your hands with soap and water for at least 20 seconds before and after you change your dressing. If soap and water are not available, use hand sanitizer. General instructions Avoid the substance that caused your reaction. If you do not know what caused it, keep a journal to try to track what caused it. Write down: What you eat and drink. What cosmetics you use. What you wear in the affected area. This includes jewelry. Contact a health care provider if: Your condition does not get better with treatment. Your condition gets worse. You have any signs of infection. You have a fever. You have new symptoms. Your bone or joint under the affected area becomes painful after the skin has healed. Get help right away if: You notice red streaks coming from the affected area. The affected area turns darker. You have trouble breathing. This information is not intended to replace advice given to you by your health care provider. Make sure you discuss any questions you have with your health care provider. Document Revised: 03/31/2022 Document Reviewed: 03/31/2022 Elsevier Patient Education  2024 [employer]Pruritus Pruritus is an itchy feeling on the skin. One of the most common causes is dry skin, but many different things can cause itching. Most cases of itching do not require medical attention. Sometimes itchy skin can turn into a rash or a secondary infection. Follow these instructions at home: Skin care  Do not use scented soaps, detergents, perfumes, and cosmetic products. Instead, use gentle, unscented versions of these items. Apply moisturizing creams to your skin frequently, at least twice daily. Apply immediately after bathing while skin is still wet. Take medicines or apply medicated creams only as told by your health care  provider. This may include: Corticosteroid cream or topical calcineurin inhibitor. Anti-itch lotions containing urea, camphor, or menthol. Oral antihistamines. Do not take hot showers or baths, which can make itching worse. A short, cool shower may help with itching as long as you apply moisturizing lotion after the shower. Apply a cool, wet cloth (cool compress) to the affected areas. You may take lukewarm baths with one of the following: Epsom salts. You can get these at your local pharmacy or grocery store. Follow the instructions on the packaging. Baking soda. Pour a small amount into the bath as told by your health care provider. Colloidal oatmeal. You can get this at your local pharmacy or grocery store. Follow the instructions on the packaging. Do not scratch your skin. General instructions Avoid wearing tight clothes. Keep a journal to help find out what is causing your itching. Write down: What you eat and drink. What cosmetic products you use. What soaps or detergents you use. What you wear, including jewelry. Use a humidifier. This keeps the air moist, which helps to prevent dry skin. Be aware of any changes in your itchiness. Tell your health care provider about any changes. Contact a health care provider if: The itching does not go away after several days. You notice redness, warmth, or drainage on the skin where you have scratched. You are unusually thirsty  or urinating more than normal. Your skin tingles or feels numb. Your skin or the white parts of your eyes turn yellow (jaundice). You feel weak. You have any of the following: Night sweats. Tiredness (fatigue). Weight loss. Abdominal pain. Summary Pruritus is an itchy feeling on the skin. One of the most common causes is dry skin, but many different conditions and factors can cause itching. Apply moisturizing creams to your skin frequently, at least twice daily. Apply immediately after bathing while skin is still  wet. Take medicines or apply medicated creams only as told by your health care provider. Do not take hot showers or baths. Do not use scented soaps, detergents, perfumes, or cosmetic products. Keep a journal to help find out what is causing your itching. This information is not intended to replace advice given to you by your health care provider. Make sure you discuss any questions you have with your health care provider. Document Revised: 11/02/2021 Document Reviewed: 11/02/2021 Elsevier Patient Education  2024 [employer].

## 2023-10-11 NOTE — Progress Notes (Signed)
 Subjective:    Patient ID: Diana Lutz, female    DOB: 02-26-1966, 58 y.o.   MRN: [id number]  57y/o caucasian female established patient has had worsening rash over the past week has been applying calamine lotion but still spreading.  Denied discharge/fever/chills but very itchy legs especially and chest.  Denied tongue swelling/diarrhea/vomiting/wheezing/shortness of breath/fever or chills.  Stated dogs run into woods frequently and unsure what they got into then sleep with her in bed.      Review of Systems  Constitutional:  Negative for chills and fever.  HENT:  Negative for congestion, facial swelling, trouble swallowing and voice change.   Eyes:  Negative for photophobia and visual disturbance.  Respiratory:  Negative for cough, choking, chest tightness, shortness of breath, wheezing and stridor.   Cardiovascular:  Negative for chest pain, palpitations and leg swelling.  Gastrointestinal:  Negative for diarrhea, nausea and vomiting.  Genitourinary:  Negative for difficulty urinating.  Musculoskeletal:  Negative for arthralgias, back pain, gait problem, myalgias, neck pain and neck stiffness.  Skin:  Positive for color change and rash. Negative for wound.  Neurological:  Negative for dizziness, tremors, seizures, syncope, facial asymmetry, speech difficulty, weakness, light-headedness, numbness and headaches.  Hematological:  Negative for adenopathy. Does not bruise/bleed easily.  Psychiatric/Behavioral:  Negative for agitation, confusion and sleep disturbance.        Objective:   Physical Exam Vitals and nursing note reviewed.  Constitutional:      General: She is awake. She is not in acute distress.    Appearance: Normal appearance. She is well-developed and well-groomed. She is obese. She is not ill-appearing, toxic-appearing or diaphoretic.  HENT:     Head: Normocephalic and atraumatic.     Jaw: There is normal jaw occlusion.     Salivary Glands: Right salivary  gland is not diffusely enlarged. Left salivary gland is not diffusely enlarged.     Right Ear: Hearing and external ear normal.     Left Ear: Hearing and external ear normal.     Nose: Nose normal. No congestion or rhinorrhea.     Mouth/Throat:     Lips: Pink. No lesions.     Mouth: Mucous membranes are moist.     Pharynx: Oropharynx is clear.  Eyes:     General: Lids are normal. Vision grossly intact. Gaze aligned appropriately. No scleral icterus.       Right eye: No discharge.        Left eye: No discharge.     Extraocular Movements: Extraocular movements intact.     Conjunctiva/sclera: Conjunctivae normal.     Pupils: Pupils are equal, round, and reactive to light.  Neck:     Trachea: Trachea and phonation normal.  Cardiovascular:     Rate and Rhythm: Normal rate and regular rhythm.     Pulses: Normal pulses.          Radial pulses are 2+ on the right side and 2+ on the left side.     Heart sounds: Normal heart sounds, S1 normal and S2 normal.  Pulmonary:     Effort: Pulmonary effort is normal. No respiratory distress.     Breath sounds: Normal breath sounds and air entry. No stridor or transmitted upper airway sounds. No decreased breath sounds or wheezing.     Comments: Spoke full sentences without difficulty; no cough observed in exam room Abdominal:     General: Abdomen is flat.     Palpations: Abdomen is soft.  Musculoskeletal:  General: Normal range of motion.     Right hand: No swelling. Normal strength. Normal capillary refill.     Left hand: No swelling. Normal strength. Normal capillary refill.     Cervical back: Normal range of motion and neck supple. No swelling, edema, deformity, erythema, signs of trauma, lacerations, rigidity, torticollis, tenderness, bony tenderness or crepitus. No pain with movement or muscular tenderness. Normal range of motion.     Thoracic back: No swelling, edema, deformity, signs of trauma, lacerations or tenderness. Normal range of  motion.     Right upper leg: No swelling, edema or lacerations.     Left upper leg: No swelling, edema or lacerations.     Right lower leg: No swelling or lacerations. No edema.     Left lower leg: No swelling or lacerations. No edema.     Right ankle: No swelling, deformity, ecchymosis or lacerations.     Left ankle: No swelling, deformity, ecchymosis or lacerations.  Lymphadenopathy:     Head:     Right side of head: No submandibular or preauricular adenopathy.     Left side of head: No submandibular or preauricular adenopathy.     Cervical: No cervical adenopathy.     Right cervical: No superficial, deep or posterior cervical adenopathy.    Left cervical: No superficial, deep or posterior cervical adenopathy.  Skin:    General: Skin is warm and dry.     Capillary Refill: Capillary refill takes less than 2 seconds.     Coloration: Skin is not ashen, cyanotic, jaundiced, mottled, pale or sallow.     Findings: Erythema and rash present. No abrasion, abscess, acne, bruising, burn, ecchymosis, signs of injury, laceration, lesion, petechiae or wound. Rash is macular and papular. Rash is not crusting, nodular, purpuric, pustular, scaling, urticarial or vesicular.     Nails: There is no clubbing.          Comments: Macular papular rash anterior right lateral chest lateral; posterior bilateral lower extremities covered in calamine lotion dry no discharge grouped papules on mildly erythematous base; largest area affected posterior thighs 4x7cm; wearing jeans and tshirt, socks, closed toed shoes  Neurological:     General: No focal deficit present.     Mental Status: She is alert and oriented to person, place, and time. Mental status is at baseline.     GCS: GCS eye subscore is 4. GCS verbal subscore is 5. GCS motor subscore is 6.     Cranial Nerves: Cranial nerves 2-12 are intact. No cranial nerve deficit, dysarthria or facial asymmetry.     Sensory: Sensation is intact.     Motor: Motor  function is intact. No weakness, tremor, atrophy, abnormal muscle tone or seizure activity.     Coordination: Coordination is intact. Coordination normal.     Gait: Gait is intact. Gait normal.     Comments: In/out of chair without difficulty; gait sure and steady in clinic; bilateral hand grasp equal 5/5  Psychiatric:        Attention and Perception: Attention and perception normal.        Mood and Affect: Mood and affect normal.        Speech: Speech normal.        Behavior: Behavior normal. Behavior is cooperative.        Thought Content: Thought content normal.        Cognition and Memory: Cognition and memory normal.        Judgment: Judgment normal.  Macular papular medial and posterior thighs/lower legs and anterior chest dry no discharge calamine lotion on lesions dried     Assessment & Plan:  A-plant irritant contact dermatitis  P-consider washing and changing bed linens.  Not letting dogs sleep with her tonight.  Start prednisone  10mg  taper discussed 2-3 week course using lower dose 40mg  x 2 days 30mg x2 days 20mg  x 3 days #21 RF0 dispensed from [facility] to patient and re-evaluation on Tuesday 16 Oct 2023. May continue calamine lotion topical prn itching.  Discussed if clothes rubbing rash apply telfa gauze given from clinic stock with aquaphor topical first under telfa. OTC  zyrtec 10mg  po BID prn itching.  calagel thin smear BID prn itching  do not get in eyes; if worsening with calagel use stop and trial hydrocortisone 1% topical BID small smear affected area.  Wash hands before and after application.  Avoid hot steam showers.  Apply emollient twice a day e.g. Fragrance free vaseline/aquaphor.  May apply ice/cold compress 5 minutes QID prn itching/swelling.  Avoid scratching as can lead to secondary infection no discharge or red streaks noted today.  Avoid steam showers.  Shower when she finishes work/prior to bedtime.  Avoid harsh/abrasive soaps use fragrance free/sensitive like  dove/cetaphil.    Medication as directed. Call or return to clinic as needed if these symptoms worsen or fail to improve as anticipated. [employer]care handouts on contact dermatitis and pruritis  Follow up for re-evaluation sooner if in 48 hours no improvement and/or worsening of rash with plan of care. Patient verbalized agreement and understanding of treatment plan and had no further questions at this time

## 2023-10-16 ENCOUNTER — Encounter: Payer: Self-pay | Admitting: Registered Nurse

## 2023-10-16 ENCOUNTER — Telehealth: Payer: Self-pay | Admitting: Registered Nurse

## 2023-10-16 DIAGNOSIS — L247 Irritant contact dermatitis due to plants, except food: Secondary | ICD-10-CM

## 2023-10-16 MED ORDER — PREDNISONE 10 MG PO TABS: ORAL_TABLET | ORAL | Status: DC

## 2023-10-16 NOTE — Telephone Encounter (Signed)
 Patient seen in [facility]center rash much improved stated diameter of patches on legs now less than 2cm no discharge/pain and itching resolved.  Still applying emollient and taking prednisone  taper.  Extended to 10mg  x 4 day and 5 mg x 4 days discussed do not want to stop prednisone  after 1 week as can have rebound worsening.  Patient A&Ox3 spoke full sentences without difficulty respiration even and unlabored face/arms/neck skin warm and pink without rash.  Gait sure and steady

## 2023-10-18 ENCOUNTER — Other Ambulatory Visit: Payer: Self-pay | Admitting: Registered Nurse

## 2023-10-18 DIAGNOSIS — Z0000 Encounter for general adult medical examination without abnormal findings: Secondary | ICD-10-CM

## 2023-10-18 NOTE — Progress Notes (Signed)
 Patient requested labs be drawn at [facility] from [facility] office.  Verified with NP Comer Gaskins patient to have A1C, CMP, Lipids, vitamin D  and urine microalbumin.  Patient needs fasting executive panel and Hgba1c for Be Well 2026 labs.  Order entered for executive panel Be Well and all others will be sent by RN Apolinar from [facility] to her [facility] office.

## 2023-10-19 ENCOUNTER — Other Ambulatory Visit: Payer: Self-pay | Admitting: Registered Nurse

## 2023-10-22 ENCOUNTER — Other Ambulatory Visit: Payer: No Typology Code available for payment source

## 2023-10-22 DIAGNOSIS — Z0000 Encounter for general adult medical examination without abnormal findings: Secondary | ICD-10-CM

## 2023-10-22 NOTE — Progress Notes (Signed)
 Pt in clinic for lab work. Venipuncture x1, right AC. Pt tolerated well.

## 2023-10-23 ENCOUNTER — Other Ambulatory Visit: Payer: Self-pay | Admitting: Primary Care

## 2023-10-23 DIAGNOSIS — Z1231 Encounter for screening mammogram for malignant neoplasm of breast: Secondary | ICD-10-CM

## 2023-10-23 LAB — CMP12+LP+TP+TSH+6AC+CBC/D/PLT

## 2023-10-24 ENCOUNTER — Encounter: Payer: Self-pay | Admitting: Registered Nurse

## 2023-10-24 ENCOUNTER — Ambulatory Visit: Payer: No Typology Code available for payment source | Admitting: Primary Care

## 2023-10-24 VITALS — BP 126/72 | HR 75 | Temp 97.7°F | Ht 67.0 in | Wt 235.0 lb

## 2023-10-24 DIAGNOSIS — E785 Hyperlipidemia, unspecified: Secondary | ICD-10-CM

## 2023-10-24 DIAGNOSIS — Z0000 Encounter for general adult medical examination without abnormal findings: Secondary | ICD-10-CM

## 2023-10-24 DIAGNOSIS — Z23 Encounter for immunization: Secondary | ICD-10-CM

## 2023-10-24 DIAGNOSIS — Z7984 Long term (current) use of oral hypoglycemic drugs: Secondary | ICD-10-CM

## 2023-10-24 DIAGNOSIS — K219 Gastro-esophageal reflux disease without esophagitis: Secondary | ICD-10-CM

## 2023-10-24 DIAGNOSIS — F4323 Adjustment disorder with mixed anxiety and depressed mood: Secondary | ICD-10-CM

## 2023-10-24 DIAGNOSIS — Z7985 Long-term (current) use of injectable non-insulin antidiabetic drugs: Secondary | ICD-10-CM

## 2023-10-24 DIAGNOSIS — K760 Fatty (change of) liver, not elsewhere classified: Secondary | ICD-10-CM

## 2023-10-24 DIAGNOSIS — E1165 Type 2 diabetes mellitus with hyperglycemia: Secondary | ICD-10-CM

## 2023-10-24 DIAGNOSIS — I10 Essential (primary) hypertension: Secondary | ICD-10-CM | POA: Diagnosis not present

## 2023-10-24 DIAGNOSIS — E559 Vitamin D deficiency, unspecified: Secondary | ICD-10-CM

## 2023-10-24 DIAGNOSIS — G4733 Obstructive sleep apnea (adult) (pediatric): Secondary | ICD-10-CM | POA: Diagnosis not present

## 2023-10-24 LAB — CMP12+LP+TP+TSH+6AC+CBC/D/PLT
ALT: 37 [IU]/L — ABNORMAL HIGH (ref 0–32)
AST: 20 [IU]/L (ref 0–40)
Albumin: 4.5 g/dL (ref 3.8–4.9)
Alkaline Phosphatase: 145 [IU]/L — ABNORMAL HIGH (ref 44–121)
BUN/Creatinine Ratio: 16 (ref 9–23)
BUN: 13 mg/dL (ref 6–24)
Basos: 1 %
Bilirubin Total: 0.7 mg/dL (ref 0.0–1.2)
Calcium: 9.3 mg/dL (ref 8.7–10.2)
Chloride: 101 mmol/L (ref 96–106)
Chol/HDL Ratio: 3.2 {ratio} (ref 0.0–4.4)
Cholesterol, Total: 181 mg/dL (ref 100–199)
Creatinine, Ser: 0.82 mg/dL (ref 0.57–1.00)
EOS (ABSOLUTE): 0.1 10*3/uL (ref 0.0–0.2)
Eos: 2 %
Estimated CHD Risk: <0.5 times avg. (ref 0.0–1.0)
Free Thyroxine Index: 2.9 (ref 1.2–4.9)
GGT: 46 [IU]/L (ref 0–60)
Globulin, Total: 2.3 g/dL (ref 1.5–4.5)
Glucose: 241 mg/dL — ABNORMAL HIGH (ref 70–99)
HDL: 56 mg/dL (ref >39)
Hematocrit: 46.1 % (ref 34.0–46.6)
Hemoglobin: 15.3 g/dL (ref 11.1–15.9)
Immature Granulocytes: 0.1 10*3/uL (ref 0.0–0.1)
Immature Granulocytes: 1 %
Iron: 104 ug/dL (ref 27–159)
LDH: 171 [IU]/L (ref 119–226)
LDL Chol Calc (NIH): 104 mg/dL — ABNORMAL HIGH (ref 0–99)
Lymphs: 30 %
MCH: 30.4 pg (ref 26.6–33.0)
MCHC: 33.2 g/dL (ref 31.5–35.7)
MCV: 92 fL (ref 79–97)
Monocytes Absolute: 0.1 10*3/uL (ref 0.0–0.4)
Monocytes Absolute: 0.7 10*3/uL (ref 0.1–0.9)
Monocytes: 8 %
Neutrophils Absolute: 2.6 10*3/uL (ref 0.7–3.1)
Neutrophils Absolute: 5.2 10*3/uL (ref 1.4–7.0)
Neutrophils: 58 %
Phosphorus: 3.2 mg/dL (ref 3.0–4.3)
Platelets: 206 10*3/uL (ref 150–450)
Potassium: 4.5 mmol/L (ref 3.5–5.2)
RBC: 5.03 x10E6/uL (ref 3.77–5.28)
RDW: 13.2 % (ref 11.7–15.4)
Sodium: 139 mmol/L (ref 134–144)
T3 Uptake Ratio: 29 % (ref 24–39)
T4, Total: 9.9 ug/dL (ref 4.5–12.0)
TSH: 1.35 u[IU]/mL (ref 0.450–4.500)
Total Protein: 6.8 g/dL (ref 6.0–8.5)
Triglycerides: 117 mg/dL (ref 0–149)
Uric Acid: 2.9 mg/dL — ABNORMAL LOW (ref 3.0–7.2)
VLDL Cholesterol Cal: 21 mg/dL (ref 5–40)
WBC: 8.6 10*3/uL (ref 3.4–10.8)
eGFR: 82 mL/min/{1.73_m2} (ref >59)

## 2023-10-24 LAB — VITAMIN D 25 HYDROXY (VIT D DEFICIENCY, FRACTURES): Vit D, 25-Hydroxy: 45.3 ng/mL (ref 30.0–100.0)

## 2023-10-24 LAB — HGB A1C W/O EAG: Hgb A1c MFr Bld: 8.6 % — ABNORMAL HIGH (ref 4.8–5.6)

## 2023-10-24 LAB — MICROALBUMIN, URINE: Microalbumin, Urine: 11.2 ug/mL

## 2023-10-24 MED ORDER — TIRZEPATIDE 2.5 MG/0.5ML ~~LOC~~ SOAJ: 2.5000 mg | SUBCUTANEOUS | 0 refills | Status: DC

## 2023-10-24 NOTE — Assessment & Plan Note (Signed)
Controlled.   Continue omeprazole 20 mg daily. 

## 2023-10-24 NOTE — Assessment & Plan Note (Signed)
 Controlled. Reviewed labs from employer from 2025

## 2023-10-24 NOTE — Patient Instructions (Signed)
 Start tirzepitide (Mounjaro ) for diabetes/weight loss. Start by injecting 2.5 mg into the skin once weekly for 4 weeks, then increase to 5 mg once weekly thereafter. Please notify me once you've used your last 2.5 mg pen so that I can prescribe the next dose.   Schedule your Pap smear as discussed.  Please schedule a follow up visit for 3 months for diabetes.  It was a pleasure to see you today!

## 2023-10-24 NOTE — Assessment & Plan Note (Signed)
 Controlled.  Continue losartan  100 mg daily, amlodipine  10 mg daily, lisinopril  10 mg daily. CMP reviewed from January 2025.

## 2023-10-24 NOTE — Progress Notes (Signed)
 Subjective:    Patient ID: Diana Lutz, female    DOB: [birth year], 58 y.o.   MRN: [id number]  HPI  Diana Lutz is a very pleasant 58 y.o. female who presents today for complete physical and follow up of chronic conditions.  Immunizations: -Tetanus: Completed in 2019 -Influenza: Influenza vaccine provided today.  -Shingles: Completed Shingrix  series -Pneumonia: Completed 2021  Diet: Fair diet.  Exercise: Walking  Eye exam: Completes annually  Dental exam: Completes semi-annually    Pap Smear: Overdue, declines today. Will schedule with GYN Mammogram: Completed last in January 2024, scheduled for 11/07/2023.   Colonoscopy: Completed in 2021, due 2028  Wt Readings from Last 3 Encounters:  10/24/23 235 lb (106.6 kg)  03/20/23 232 lb (105.2 kg)  10/04/22 239 lb (108.4 kg)     BP Readings from Last 3 Encounters:  10/24/23 126/72  10/11/23 130/80  03/20/23 126/82      Review of Systems  Constitutional:  Negative for unexpected weight change.  HENT:  Negative for rhinorrhea.   Respiratory:  Negative for cough and shortness of breath.   Cardiovascular:  Negative for chest pain.  Gastrointestinal:  Negative for constipation and diarrhea.  Genitourinary:  Negative for difficulty urinating.  Musculoskeletal:  Negative for arthralgias and myalgias.  Skin:  Negative for rash.  Allergic/Immunologic: Negative for environmental allergies.  Neurological:  Negative for dizziness, numbness and headaches.  Psychiatric/Behavioral:  The patient is not nervous/anxious.          Past Medical History:  Diagnosis Date   Abnormal Pap smear of cervix 1991   Acute right-sided thoracic back pain 05/17/2021   Anemia    history of anemia   Diabetes mellitus without complication (HCC)    Dysplasia of cervix    cervical ca   GERD (gastroesophageal reflux disease)    Hx of adenomatous polyp of colon 05/30/2020   Hypertension    Migraines    NAFLD (nonalcoholic  fatty liver disease) 12/27/2021   Right upper quadrant abdominal pain 06/15/2021    Social History   Socioeconomic History   Marital status: Married    Spouse name: Not on file   Number of children: Not on file   Years of education: Not on file   Highest education level: Associate degree: occupational, Scientist, product/process development, or vocational program  Occupational History   Not on file  Tobacco Use   Smoking status: Never   Smokeless tobacco: Never  Vaping Use   Vaping status: Never Used  Substance and Sexual Activity   Alcohol use: Not Currently    Alcohol/week: 0.0 standard drinks of alcohol    Comment: social   Drug use: No   Sexual activity: Not Currently  Other Topics Concern   Not on file  Social History Narrative   Married.   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   Enjoys four wheeling, working on the house   Social Drivers of Health   Financial Resource Strain: Low Risk  (10/24/2023)   Overall Financial Resource Strain (CARDIA)    Difficulty of Paying Living Expenses: Not very hard  Food Insecurity: No Food Insecurity (10/24/2023)   Hunger Vital Sign    Worried About Running Out of Food in the Last Year: Never true    Ran Out of Food in the Last Year: Never true  Transportation Needs: No Transportation Needs (10/24/2023)   PRAPARE - Administrator, Civil Service (Medical): No    Lack of Transportation (  Non-Medical): No  Physical Activity: Insufficiently Active (10/24/2023)   Exercise Vital Sign    Days of Exercise per Week: 3 days    Minutes of Exercise per Session: 20 min  Stress: No Stress Concern Present (10/24/2023)   [employer] of Occupational Health - Occupational Stress Questionnaire    Feeling of Stress : Not at all  Social Connections: Socially Integrated (10/24/2023)   Social Connection and Isolation Panel [NHANES]    Frequency of Communication with Friends and Family: More than three times a week    Frequency of Social  Gatherings with Friends and Family: Twice a week    Attends Religious Services: More than 4 times per year    Active Member of [employer] or Organizations: No    Attends Engineer, structural: More than 4 times per year    Marital Status: Married  Catering manager Violence: Unknown (01/11/2022)   Received from [facility], [facility]   HITS    Physically Hurt: Not on file    Insult or Talk Down To: Not on file    Threaten Physical Harm: Not on file    Scream or Curse: Not on file    Past Surgical History:  Procedure Laterality Date   ANTERIOR AND POSTERIOR REPAIR N/A 08/18/2015   Procedure: ANTERIOR (CYSTOCELE) AND POSTERIOR REPAIR (RECTOCELE)/Perineorrhaphy;  Surgeon: Terri Fester, MD;  Location: [facility];  Service: Gynecology;  Laterality: N/A;  2 hrs.   BLADDER SUSPENSION N/A 08/18/2015   Procedure: TRANSVAGINAL TAPE (TVT) Mid-Urethral Sling PROCEDURE;  Surgeon: Terri Fester, MD;  Location: [facility];  Service: Gynecology;  Laterality: N/A;   COLONOSCOPY  11/15/2005   normal-Dr.Gessner   CYSTOSCOPY N/A 08/18/2015   Procedure: CYSTOSCOPY;  Surgeon: Terri Fester, MD;  Location: [facility];  Service: Gynecology;  Laterality: N/A;   HYSTEROSCOPY W/ ENDOMETRIAL ABLATION     LASER ABLATION OF THE CERVIX     WISDOM TOOTH EXTRACTION      Family History  Problem Relation Age of Onset   Hypertension Mother    Diverticulosis Mother    Colon polyps Mother    Hypertension Father    Stroke Father    Heart attack Father    Prostate cancer Father    Bladder Cancer Father    Hypertension Brother    Diabetes Brother    Colon cancer Maternal Grandmother 32   Esophageal cancer Neg Hx    Rectal cancer Neg Hx    Stomach cancer Neg Hx    Breast cancer Neg Hx     No Known Allergies  Current Outpatient Medications on File Prior to Visit  Medication Sig Dispense Refill   amLODipine  (NORVASC ) 10 MG tablet Take 1 tablet (10 mg total) by mouth daily. For blood pressure 90 tablet 3    amLODipine  (NORVASC ) 10 MG tablet Take 1 tablet (10 mg total) by mouth daily. 90 tablet 2   calcium  carbonate (TUMS - DOSED IN MG ELEMENTAL CALCIUM ) 500 MG chewable tablet Chew 2 tablets by mouth daily as needed for indigestion or heartburn.     Cholecalciferol  125 MCG (5000 UT) TABS Take 1 tablet (5,000 Units total) by mouth daily. Take with meal 90 tablet 3   [facility] LUBRICANT EYE DROPS 0.4-0.3 % SOLN APPLY 1 DROP TO EYE 3 (THREE) TIMES DAILY.  12   cyclobenzaprine  (FLEXERIL ) 10 MG tablet Take 0.5-1 tablets (5-10 mg total) by mouth 3 (three) times daily as needed for muscle spasms. 30 tablet 0   fluticasone  (FLONASE ) 50 MCG/ACT  nasal spray Place 1 spray into both nostrils 2 (two) times daily. 48 mL 2   ibuprofen (ADVIL,MOTRIN) 600 MG tablet Take 600 mg by mouth daily as needed.     lisinopril  (ZESTRIL ) 10 MG tablet Take 10 mg by mouth daily.     losartan  (COZAAR ) 100 MG tablet TAKE 1 TABLET BY MOUTH EVERY DAY FOR BLOOD PRESSURE 90 tablet 3   metFORMIN  (GLUCOPHAGE -XR) 500 MG 24 hr tablet TAKE 2 TABLETS (1,000 MG TOTAL) BY MOUTH DAILY WITH BREAKFAST. FOR DIABETES. 180 tablet 0   mineral oil-hydrophilic petrolatum (AQUAPHOR) ointment Apply topically as needed for up to 14 days for dry skin.     omeprazole  (PRILOSEC) 20 MG capsule Take 1 capsule (20 mg total) by mouth daily. 90 capsule 2   rosuvastatin  (CRESTOR ) 5 MG tablet Take 1 tablet (5 mg total) by mouth every evening. For cholesterol. 90 tablet 3   rosuvastatin  (CRESTOR ) 5 MG tablet Take 1 tablet (5 mg total) by mouth daily.     albuterol  (VENTOLIN  HFA) 108 (90 Base) MCG/ACT inhaler Inhale 1-2 puffs into the lungs every 4 (four) hours as needed for wheezing or shortness of breath. 1 each 0   sodium chloride  (OCEAN) 0.65 % SOLN nasal spray Place 2 sprays into both nostrils every 2 (two) hours while awake.     No current facility-administered medications on file prior to visit.    BP 126/72   Pulse 75   Temp 97.7 F (36.5 C) (Temporal)   Ht 5'  7" (1.702 m)   Wt 235 lb (106.6 kg)   SpO2 97%   BMI 36.81 kg/m  Objective:   Physical Exam HENT:     Right Ear: Tympanic membrane and ear canal normal.     Left Ear: Tympanic membrane and ear canal normal.  Eyes:     Pupils: Pupils are equal, round, and reactive to light.  Cardiovascular:     Rate and Rhythm: Normal rate and regular rhythm.  Pulmonary:     Effort: Pulmonary effort is normal.     Breath sounds: Normal breath sounds.  Abdominal:     General: Bowel sounds are normal.     Palpations: Abdomen is soft.     Tenderness: There is no abdominal tenderness.  Musculoskeletal:        General: Normal range of motion.     Cervical back: Neck supple.  Skin:    General: Skin is warm and dry.  Neurological:     Mental Status: She is alert and oriented to person, place, and time.     Cranial Nerves: No cranial nerve deficit.     Deep Tendon Reflexes:     Reflex Scores:      Patellar reflexes are 2+ on the right side and 2+ on the left side. Psychiatric:        Mood and Affect: Mood normal.           Assessment & Plan:  Preventative health care Assessment & Plan: Immunizations UTD. Pap smear overdue, she declines but will schedule with GYN Mammogram scheduled.  Colonoscopy UTD, due 2028  Discussed the importance of a healthy diet and regular exercise in order for weight loss, and to reduce the risk of further co-morbidity.  Exam stable. Reviewed labs from employer from January 2025.  Follow up in 1 year for repeat physical.    Essential hypertension Assessment & Plan: Controlled.  Continue losartan  100 mg daily, amlodipine  10 mg daily, lisinopril  10 mg daily. CMP  reviewed from January 2025.    OSA (obstructive sleep apnea) Assessment & Plan: Continue CPAP at night.   Gastroesophageal reflux disease, unspecified whether esophagitis present Assessment & Plan: Controlled.  Continue omeprazole  20 mg daily.   NAFLD (nonalcoholic fatty liver  disease) Assessment & Plan: Slight increase in liver enzymes, reviewed labs from January 2025.  Will start Mounjaro  for diabetes treatment and weight loss. Continue to monitor.   Type 2 diabetes mellitus with hyperglycemia, without long-term current use of insulin (HCC) Assessment & Plan: Slight improvement with A1c of 8.6 but still above goal. Also no follow-up recently.  Long discussion about the need to improve diet, continue to improve exercise.  Continue metformin  ER 1000 mg twice daily. Add Mounjaro . Start tirzepitide (Mounjaro ) for diabetes/weight loss. Start by injecting 2.5 mg into the skin once weekly for 4 weeks, then increase to 5 mg once weekly thereafter.   Follow-up in 3 months.  Orders: -     Tirzepatide ; Inject 2.5 mg into the skin once a week. for diabetes.  Dispense: 2 mL; Refill: 0  Adjustment reaction with anxiety and depression Assessment & Plan: Controlled. No concerns today.   Hyperlipidemia, unspecified hyperlipidemia type Assessment & Plan: Reviewed recent lipid panel from January 2025 through employer. Slightly deteriorated.  Continue rosuvastatin  5 mg daily.  She will be working on weight loss with Mounjaro .   Vitamin D  deficiency Assessment & Plan: Controlled. Reviewed labs from employer from 2025         Gabriel John, NP

## 2023-10-24 NOTE — Assessment & Plan Note (Signed)
 Slight increase in liver enzymes, reviewed labs from January 2025.  Will start Mounjaro  for diabetes treatment and weight loss. Continue to monitor.

## 2023-10-24 NOTE — Assessment & Plan Note (Signed)
 Slight improvement with A1c of 8.6 but still above goal. Also no follow-up recently.  Long discussion about the need to improve diet, continue to improve exercise.  Continue metformin  ER 1000 mg twice daily. Add Mounjaro . Start tirzepitide (Mounjaro ) for diabetes/weight loss. Start by injecting 2.5 mg into the skin once weekly for 4 weeks, then increase to 5 mg once weekly thereafter.   Follow-up in 3 months.

## 2023-10-24 NOTE — Assessment & Plan Note (Signed)
 Continue CPAP at night

## 2023-10-24 NOTE — Assessment & Plan Note (Signed)
 Controlled.  No concerns today.

## 2023-10-24 NOTE — Assessment & Plan Note (Signed)
 Immunizations UTD. Pap smear overdue, she declines but will schedule with GYN Mammogram scheduled.  Colonoscopy UTD, due 2028  Discussed the importance of a healthy diet and regular exercise in order for weight loss, and to reduce the risk of further co-morbidity.  Exam stable. Reviewed labs from employer from January 2025.  Follow up in 1 year for repeat physical.

## 2023-10-24 NOTE — Assessment & Plan Note (Signed)
 Reviewed recent lipid panel from January 2025 through employer. Slightly deteriorated.  Continue rosuvastatin  5 mg daily.  She will be working on weight loss with Mounjaro .

## 2023-10-25 ENCOUNTER — Encounter: Payer: Self-pay | Admitting: Registered Nurse

## 2023-10-25 ENCOUNTER — Telehealth: Payer: Self-pay | Admitting: Registered Nurse

## 2023-10-25 DIAGNOSIS — I10 Essential (primary) hypertension: Secondary | ICD-10-CM

## 2023-10-25 DIAGNOSIS — E1165 Type 2 diabetes mellitus with hyperglycemia: Secondary | ICD-10-CM

## 2023-10-25 MED ORDER — METFORMIN HCL ER 500 MG PO TB24: 1000.0000 mg | ORAL_TABLET | Freq: Every day | ORAL | 0 refills | Status: DC

## 2023-10-25 MED ORDER — LISINOPRIL 10 MG PO TABS: 10.0000 mg | ORAL_TABLET | Freq: Every day | ORAL | Status: DC

## 2023-10-25 NOTE — Telephone Encounter (Signed)
Patient last filled 90 day supply rosuvastatin, amlodipine and omeprazole 07/17/23.  Lisinopril 10mg  po daily also requested today on formulary.  BP PCM 126/72 HR 75 Labs stable   Dispensed 90 day supply omeprazole 20mg  DR po daily, lisinopril 10mg  po daily, amlodipine 10mg  po daily and rosuvastatin 5mg  po daily to patient today from [facility]  Latest Reference Range & Units 10/19/23 08:40  Sodium 134 - 144 mmol/L 139  Potassium 3.5 - 5.2 mmol/L 4.5  Chloride 96 - 106 mmol/L 101  Glucose 70 - 99 mg/dL 016 (H)  BUN 6 - 24 mg/dL 13  Creatinine 0.10 - 9.32 mg/dL 3.55  Calcium 8.7 - 73.2 mg/dL 9.3  BUN/Creatinine Ratio 9 - 23  16  eGFR >59 mL/min/1.73 82  Phosphorus 3.0 - 4.3 mg/dL 3.2  Alkaline Phosphatase 44 - 121 IU/L 145 (H)  Albumin 3.8 - 4.9 g/dL 4.5  Uric Acid 3.0 - 7.2 mg/dL 2.9 (L)  AST 0 - 40 IU/L 20  ALT 0 - 32 IU/L 37 (H)  Total Protein 6.0 - 8.5 g/dL 6.8  Total Bilirubin 0.0 - 1.2 mg/dL 0.7  GGT 0 - 60 IU/L 46  Estimated CHD Risk 0.0 - 1.0 times avg.  < 0.5  LDH 119 - 226 IU/L 171  Total CHOL/HDL Ratio 0.0 - 4.4 ratio 3.2  Cholesterol, Total 100 - 199 mg/dL 202  HDL Cholesterol >54 mg/dL 56  Triglycerides 0 - 270 mg/dL 623  VLDL Cholesterol Cal 5 - 40 mg/dL 21  LDL Chol Calc (NIH) 0 - 99 mg/dL 762 (H)  Iron 27 - 831 ug/dL 517  Vitamin D, 61-YWVPXTG 30.0 - 100.0 ng/mL 45.3  Globulin, Total 1.5 - 4.5 g/dL 2.3  WBC 3.4 - 62.6 R48N4/OE 8.6  RBC 3.77 - 5.28 x10E6/uL 5.03  Hemoglobin 11.1 - 15.9 g/dL 70.3  HCT 50.0 - 93.8 % 46.1  MCV 79 - 97 fL 92  MCH 26.6 - 33.0 pg 30.4  MCHC 31.5 - 35.7 g/dL 18.2  RDW 99.3 - 71.6 % 13.2  Platelets 150 - 450 x10E3/uL 206  Hematology Comments:  Note:  Neutrophils Not Estab. % 58  Immature Granulocytes Not Estab. % 1  NEUT# 1.4 - 7.0 x10E3/uL 5.2  Lymphs Abs 0.7 - 3.1 x10E3/uL 2.6  Monocytes Absolute 0.1 - 0.9 x10E3/uL 0.7  Basophils Absolute 0.0 - 0.2 x10E3/uL 0.1  Immature Grans (Abs) 0.0 - 0.1 x10E3/uL 0.1  Lymphs Not Estab. % 30   Monocytes Not Estab. % 8  Basos Not Estab. % 1  Eos Not Estab. % 2  EOS (ABSOLUTE) 0.0 - 0.4 x10E3/uL 0.1  Hemoglobin A1C 4.8 - 5.6 % 8.6 (H)  TSH 0.450 - 4.500 uIU/mL 1.350  Thyroxine (T4) 4.5 - 12.0 ug/dL 9.9  Free Thyroxine Index 1.2 - 4.9  2.9  T3 Uptake Ratio 24 - 39 % 29  (H): Data is abnormally high (L): Data is abnormally low

## 2023-10-25 NOTE — Telephone Encounter (Signed)
Dermatitis resolved denied further questions or concerns.

## 2023-10-25 NOTE — Telephone Encounter (Signed)
Patient had labs and appt with PCM started on monjourno   Latest Reference Range & Units 10/19/23 08:40  Sodium 134 - 144 mmol/L 139  Potassium 3.5 - 5.2 mmol/L 4.5  Chloride 96 - 106 mmol/L 101  Glucose 70 - 99 mg/dL 161 (H)  BUN 6 - 24 mg/dL 13  Creatinine 0.96 - 0.45 mg/dL 4.09  Calcium 8.7 - 81.1 mg/dL 9.3  BUN/Creatinine Ratio 9 - 23  16  eGFR >59 mL/min/1.73 82  Phosphorus 3.0 - 4.3 mg/dL 3.2  Alkaline Phosphatase 44 - 121 IU/L 145 (H)  Albumin 3.8 - 4.9 g/dL 4.5  Uric Acid 3.0 - 7.2 mg/dL 2.9 (L)  AST 0 - 40 IU/L 20  ALT 0 - 32 IU/L 37 (H)  Total Protein 6.0 - 8.5 g/dL 6.8  Total Bilirubin 0.0 - 1.2 mg/dL 0.7  GGT 0 - 60 IU/L 46  Estimated CHD Risk 0.0 - 1.0 times avg.  < 0.5  LDH 119 - 226 IU/L 171  Total CHOL/HDL Ratio 0.0 - 4.4 ratio 3.2  Cholesterol, Total 100 - 199 mg/dL 914  HDL Cholesterol >78 mg/dL 56  Triglycerides 0 - 295 mg/dL 621  VLDL Cholesterol Cal 5 - 40 mg/dL 21  LDL Chol Calc (NIH) 0 - 99 mg/dL 308 (H)  Iron 27 - 657 ug/dL 846  Vitamin D, 96-EXBMWUX 30.0 - 100.0 ng/mL 45.3  Globulin, Total 1.5 - 4.5 g/dL 2.3  WBC 3.4 - 32.4 M01U2/VO 8.6  RBC 3.77 - 5.28 x10E6/uL 5.03  Hemoglobin 11.1 - 15.9 g/dL 53.6  HCT 64.4 - 03.4 % 46.1  MCV 79 - 97 fL 92  MCH 26.6 - 33.0 pg 30.4  MCHC 31.5 - 35.7 g/dL 74.2  RDW 59.5 - 63.8 % 13.2  Platelets 150 - 450 x10E3/uL 206  Hematology Comments:  Note:  Neutrophils Not Estab. % 58  Immature Granulocytes Not Estab. % 1  NEUT# 1.4 - 7.0 x10E3/uL 5.2  Lymphs Abs 0.7 - 3.1 x10E3/uL 2.6  Monocytes Absolute 0.1 - 0.9 x10E3/uL 0.7  Basophils Absolute 0.0 - 0.2 x10E3/uL 0.1  Immature Grans (Abs) 0.0 - 0.1 x10E3/uL 0.1  Lymphs Not Estab. % 30  Monocytes Not Estab. % 8  Basos Not Estab. % 1  Eos Not Estab. % 2  EOS (ABSOLUTE) 0.0 - 0.4 x10E3/uL 0.1  Hemoglobin A1C 4.8 - 5.6 % 8.6 (H)  TSH 0.450 - 4.500 uIU/mL 1.350  Thyroxine (T4) 4.5 - 12.0 ug/dL 9.9  Free Thyroxine Index 1.2 - 4.9  2.9  T3 Uptake Ratio 24 - 39 % 29   (H): Data is abnormally high (L): Data is abnormally low

## 2023-10-26 MED ORDER — LOSARTAN POTASSIUM 100 MG PO TABS
100.0000 mg | ORAL_TABLET | Freq: Every day | ORAL | 3 refills | Status: AC
  Filled 2024-02-15: qty 90, 90d supply, fill #0
  Filled 2024-08-14: qty 90, 90d supply, fill #1

## 2023-10-26 NOTE — Progress Notes (Signed)
Spoke with patient 10/25/23 stated reviewed my chart message and had follow up with [facility], [facility], [facility] and discussed results at appt also.  She requested refill of her [facility] filled medications completed see tcon dated 10/25/23.  Patient left message for NP stated no longer taking lisinopril and returned to clinic/unopened same day for return to stock.  Patient instructed to contact PCM as office note has her taking amlodipine/losartan and lisinopril.  She stated she stopped taking lisinopril when she started losartan.  Message sent to [facility]  Patient agreed with plan of care and had no further questions at this time.

## 2023-10-29 ENCOUNTER — Other Ambulatory Visit (HOSPITAL_COMMUNITY): Payer: Self-pay

## 2023-10-29 DIAGNOSIS — M25551 Pain in right hip: Secondary | ICD-10-CM | POA: Insufficient documentation

## 2023-10-30 NOTE — Telephone Encounter (Signed)
Secure message was sent to [facility] 10/25/23 regarding patient report stopped lisinopril when started losartan.  Patient returned [facility] bottle of lisinopril to RN Reece Packer on 10/25/23 unopened.  I returned medication to stock/inventory today 10/30/2023 and cancelled Rx in [facility].

## 2023-11-05 DIAGNOSIS — S76311D Strain of muscle, fascia and tendon of the posterior muscle group at thigh level, right thigh, subsequent encounter: Secondary | ICD-10-CM | POA: Insufficient documentation

## 2023-11-06 ENCOUNTER — Inpatient Hospital Stay: Admission: RE | Admit: 2023-11-06 | Payer: No Typology Code available for payment source | Source: Ambulatory Visit

## 2023-11-07 ENCOUNTER — Ambulatory Visit
Admission: RE | Admit: 2023-11-07 | Discharge: 2023-11-07 | Disposition: A | Discharge status: Home / Self Care | Payer: No Typology Code available for payment source | Source: Ambulatory Visit | Attending: Primary Care

## 2023-11-07 DIAGNOSIS — Z1231 Encounter for screening mammogram for malignant neoplasm of breast: Secondary | ICD-10-CM

## 2023-11-21 ENCOUNTER — Telehealth: Payer: Self-pay

## 2023-11-21 ENCOUNTER — Other Ambulatory Visit (HOSPITAL_COMMUNITY): Payer: Self-pay

## 2023-11-21 NOTE — Telephone Encounter (Signed)
Pharmacy Patient Advocate Encounter   Received notification from Patient Pharmacy that prior authorization for [facility] 2.5mg /0.30ml is required/requested.   Insurance verification completed.   The patient is insured through [employer] .   Per test claim: PA required; PA submitted to above mentioned insurance via Prompt PA Key/confirmation #/EOC [id number] Status is pending   ID: [id number]

## 2023-11-21 NOTE — Telephone Encounter (Signed)
Pharmacy Patient Advocate Encounter   Received notification from Fax that prior authorization for MOUNJARO 2.5 MG/0.5 ML PEN is required/requested.   Insurance verification completed.   The patient is insured through [facility] .   Per test claim: PA required; PA submitted to above mentioned insurance via Prompt PA Key/confirmation #/EOC [id number] Status is pending .   [email or web address]

## 2023-11-23 ENCOUNTER — Other Ambulatory Visit (HOSPITAL_COMMUNITY): Payer: Self-pay

## 2023-11-23 NOTE — Telephone Encounter (Signed)
Pharmacy Patient Advocate Encounter  Received notification from [employer] that Prior Authorization for Mounjaro 2.5mg /0.9ml has been APPROVED from 11/22/23 to 11/20/24. Ran test claim, Copay is $25. This test claim was processed through [facility]- copay amounts may vary at other pharmacies due to pharmacy/plan contracts, or as the patient moves through the different stages of their insurance plan.   PA #/Case ID/Reference #: [id number]  Approval letter indexed to media tab

## 2023-11-28 ENCOUNTER — Other Ambulatory Visit (HOSPITAL_COMMUNITY): Payer: Self-pay

## 2023-11-28 NOTE — Telephone Encounter (Signed)
Pharmacy Patient Advocate Encounter  Received notification from [employer] that Prior Authorization for [facility] 2.5 MG/0.5 ML PEN  has been APPROVED from 11/22/23 to 11/20/24. Unable to obtain price due to refill too soon rejection, last fill date 11/26/23 next available fill date3/9/25   PA #/Case ID/Reference #:  [id number]

## 2023-12-03 ENCOUNTER — Ambulatory Visit: Payer: No Typology Code available for payment source

## 2023-12-03 DIAGNOSIS — S61219D Laceration without foreign body of unspecified finger without damage to nail, subsequent encounter: Secondary | ICD-10-CM

## 2023-12-03 NOTE — Progress Notes (Signed)
 Pt reports that she "cut her finger while removing a label from a piece of [place]." Pt had abrasion splinted and a band-aid covering the wound. Bruising noted to the finger and minimal swelling. Wound appears clean, dry and healing. No purulent drainage or erythema noted. Clean bandage applied to wound and will set pt up with a follow up with Inetta Fermo, NP.

## 2023-12-04 ENCOUNTER — Ambulatory Visit: Payer: No Typology Code available for payment source | Admitting: Registered Nurse

## 2023-12-04 ENCOUNTER — Encounter: Payer: Self-pay | Admitting: Registered Nurse

## 2023-12-04 DIAGNOSIS — S61211A Laceration without foreign body of left index finger without damage to nail, initial encounter: Secondary | ICD-10-CM

## 2023-12-04 NOTE — Patient Instructions (Signed)
Laceration Care, Adult A laceration is a cut that may go through all layers of the skin and into the tissue that is right under the skin. Some lacerations heal on their own. Others need to be closed with stitches (sutures), staples, skin adhesive strips, or skin glue. Proper care of a laceration reduces the risk for infection, helps the laceration heal better, and may prevent scarring. General tips Keep the wound clean and dry. Do not scratch or pick at the wound. Wash your hands with soap and water for at least 20 seconds before and after touching your wound or changing your bandage (dressing). If soap and water are not available, use hand sanitizer. Do not usedisinfectants or antiseptics, such as rubbing alcohol, to clean your wound unless told by your health care provider. If you were given a dressing, you should change it at least once a day, or as told by your health care provider. You should also change it if it becomes wet or dirty. How to care for your laceration If sutures or staples were used: Keep the wound completely dry for the first 24 hours, or as told by your health care provider. After that time, you may shower or bathe. Do not soak your wound in water until after the sutures or staples have been removed. Clean the wound once each day, or as told by your health care provider. To do this: Wash the wound with soap and water. Rinse the wound with water to remove all soap. Pat the wound dry with a clean towel. Do not rub the wound. After cleaning the wound, apply a thin layer of antibiotic ointment, other topical ointments, or a non-adherent dressing as told by your health care provider. This will help prevent infection and keep the dressing from sticking to the wound. Have the sutures or staples removed as told by your health care provider. Do not  remove sutures or staples yourself. If skin adhesive strips were used: Do not get the skin adhesive strips wet. You may shower or bathe,  but keep the wound dry. If the wound gets wet, pat it dry with a clean towel. Do not rub the wound. Skin adhesive strips fall off on their own. If adhesive strip edges start to loosen and curl up, you may trim the loose edges. Do not remove adhesive strips completely unless your health care provider tells you to do that. If skin glue was used: You may shower or bathe, but try to keep the wound dry. Do not soak the wound in water. After showering or bathing, pat the wound dry with a clean towel. Do not rub the wound. Do not do any activities that will make you sweat a lot until the skin glue has fallen off. Do not apply liquid, cream, or ointment medicine to the wound while the skin glue is in place. Doing this may loosen the film before the wound has healed. If a dressing is placed over the wound, do not apply tape directly over the skin glue. Doing this may cause the glue to be pulled off before the wound has healed. Do not pick at the glue. Skin glue usually remains in place for 5-10 days and then falls off the skin. Follow these instructions at home: Medicines Take over-the-counter and prescription medicines only as told by your health care provider. If you were prescribed an antibiotic medicine or ointment, take or apply it as told by your health care provider. Do not stop using it even if  your condition improves. Managing pain and swelling If directed, put ice on the injured area. To do this: Put ice in a plastic bag. Place a towel between your skin and the bag. Leave the ice on for 20 minutes, 2-3 times a day. Remove the ice if your skin turns bright red. This is very important. If you cannot feel pain, heat, or cold, you have a greater risk of damage to the area. Raise (elevate) the injured area above the level of your heart while you are sitting or lying down for the first 24-48 hours after the laceration is repaired. General instructions  Avoid any activity that could cause your wound  to reopen. Check your wound every day for signs of infection. Watch for: More redness, swelling, or pain. Fluid or blood. Warmth. Pus or a bad smell. Keep all follow-up visits. This is important. Contact a health care provider if: You received a tetanus shot and you have swelling, severe pain, redness, or bleeding at the injection site. Your closed wound breaks open. You have any of these signs of infection: More redness, swelling, or pain around your wound. Fluid or blood coming from your wound. Warmth coming from your wound. Pus or a bad smell coming from your wound. A fever. You notice something coming out of the wound, such as wood or glass. Your pain is not controlled with medicine. You notice a change in the color of your skin near your wound. You need to change the dressing often. You develop a new rash. You have numbness around the wound. Get help right away if: You develop severe swelling around the wound. Your pain suddenly increases and is severe. You develop painful lumps near the wound or on skin anywhere else on your body. You have a red streak going away from your wound. The wound is on your hand or foot, and you cannot properly move a finger or toe. The wound is on your hand or foot, and you notice that your fingers or toes look pale or bluish. Summary A laceration is a cut that may go through all layers of the skin and into the tissue that is right under the skin. Some lacerations heal on their own. Others need to be closed with stitches (sutures), staples, skin adhesive strips, or skin glue. Proper care of a laceration reduces the risk of infection, helps the laceration heal better, and may prevent scarring. This information is not intended to replace advice given to you by your health care provider. Make sure you discuss any questions you have with your health care provider. Document Revised: 12/02/2020 Document Reviewed: 12/02/2020 Elsevier Patient Education   2024 [employer].

## 2023-12-04 NOTE — Progress Notes (Signed)
 Subjective:    Patient ID: Diana Lutz, female    DOB: December 21, 1965, 58 y.o.   MRN: [id number]  58yo established caucasian female here for evaluation laceration 2nd digit occurred at work 11/30/23 blade slipped when trying to clean label off piece.  Washed area with soap and water and covered with bandaid.  Tetanus up to date denied signs of infection and healing well but wondering if she needs a different finger splint as made one from silk tape and toothpicks until she could be seen in clinic wound kept reopening until she wore splint  Finger still a little swollen but feeling better      Review of Systems  Constitutional:  Negative for chills and fever.  HENT:  Negative for trouble swallowing and voice change.   Respiratory:  Negative for cough.   Cardiovascular:  Negative for chest pain.  Gastrointestinal:  Negative for diarrhea, nausea and vomiting.  Genitourinary:  Negative for difficulty urinating.  Musculoskeletal:  Positive for myalgias. Negative for gait problem, joint swelling, neck pain and neck stiffness.  Skin:  Positive for wound. Negative for color change and pallor.  Neurological:  Negative for dizziness, tremors, facial asymmetry, weakness, numbness and headaches.  Hematological:  Negative for adenopathy. Does not bruise/bleed easily.  Psychiatric/Behavioral:  Negative for agitation, confusion and sleep disturbance.        Objective:   Physical Exam Vitals reviewed.  Constitutional:      General: She is awake. She is not in acute distress.    Appearance: Normal appearance. She is well-developed, well-groomed and overweight. She is not ill-appearing, toxic-appearing or diaphoretic.  HENT:     Head: Normocephalic and atraumatic.     Jaw: There is normal jaw occlusion.     Salivary Glands: Right salivary gland is not diffusely enlarged. Left salivary gland is not diffusely enlarged.     Right Ear: Hearing and external ear normal.     Left Ear: Hearing and  external ear normal.     Nose: Nose normal. No congestion or rhinorrhea.     Mouth/Throat:     Lips: Pink. No lesions.     Mouth: Mucous membranes are moist. No oral lesions or angioedema.     Dentition: No gum lesions.     Pharynx: Oropharynx is clear. Uvula midline.  Eyes:     General: Lids are normal. Vision grossly intact. Gaze aligned appropriately. Allergic shiner present. No scleral icterus.       Right eye: No discharge.        Left eye: No discharge.     Extraocular Movements: Extraocular movements intact.     Conjunctiva/sclera: Conjunctivae normal.     Pupils: Pupils are equal, round, and reactive to light.  Neck:     Trachea: Trachea normal.  Cardiovascular:     Rate and Rhythm: Normal rate and regular rhythm.     Pulses: Normal pulses.          Radial pulses are 2+ on the right side and 2+ on the left side.  Pulmonary:     Effort: Pulmonary effort is normal. No respiratory distress.     Breath sounds: Normal breath sounds and air entry. No stridor or transmitted upper airway sounds. No wheezing.     Comments: Spoke full sentences without difficulty; no cough observed in exam room Abdominal:     General: Abdomen is flat.     Palpations: Abdomen is soft.  Musculoskeletal:        General:  Swelling, tenderness and signs of injury present. No deformity. Normal range of motion.     Right forearm: No swelling, edema, deformity, lacerations or tenderness.     Left forearm: No swelling, edema, deformity, lacerations or tenderness.     Right wrist: No swelling, deformity, effusion, lacerations or crepitus. Normal range of motion.     Left wrist: No swelling, deformity, effusion, lacerations or crepitus. Normal range of motion.     Right hand: No swelling, deformity, lacerations, tenderness or bony tenderness. Normal range of motion. Normal strength. Normal sensation. There is no disruption of two-point discrimination. Normal capillary refill.     Left hand: Swelling, laceration  and tenderness present. No deformity or bony tenderness. Normal range of motion. Normal strength. Normal sensation. There is no disruption of two-point discrimination. Normal capillary refill.     Cervical back: Normal range of motion and neck supple. No rigidity.     Comments: Left 2nd digit with 0-1+/4 nonpitting edema distal to PIP joint posterior faint ecchymosis and wound edges well approximated scab clean dry brown no erythema or discharge mildly TTP adjacent to laceration 1cm; removed home made splint tooth picks and silk tape; fitted and distributed plastic splint from clinic stock and given back up splint cushioned aluminum with roll of cobain in case plastic starts rubbing healing laceration.  Lymphadenopathy:     Head:     Right side of head: No submandibular or preauricular adenopathy.     Left side of head: No submandibular or preauricular adenopathy.     Cervical: No cervical adenopathy.     Right cervical: No superficial cervical adenopathy.    Left cervical: No superficial cervical adenopathy.  Skin:    General: Skin is warm and dry.     Capillary Refill: Capillary refill takes less than 2 seconds.     Coloration: Skin is not ashen, cyanotic, jaundiced, mottled, pale or sallow.     Findings: Bruising, ecchymosis and laceration present. No abrasion, abscess, acne, burn, erythema, signs of injury, lesion, petechiae, rash or wound.     Nails: There is no clubbing.          Comments: Left 2nd distal phalanx dorsum laceration 1cm healing wound edges well approximated faint ecchymosis mild nonpitting edema localized no crepitus full strength and AROM  Neurological:     General: No focal deficit present.     Mental Status: She is alert and oriented to person, place, and time. Mental status is at baseline.     GCS: GCS eye subscore is 4. GCS verbal subscore is 5. GCS motor subscore is 6.     Cranial Nerves: No cranial nerve deficit, dysarthria or facial asymmetry.     Motor: Motor  function is intact. No weakness, tremor, atrophy, abnormal muscle tone or seizure activity.     Coordination: Coordination is intact. Coordination normal.     Gait: Gait is intact. Gait normal.     Comments: In/out of chair without difficulty; gait sure and steady in clinic; bilateral hand grasp equal 5/5  Psychiatric:        Attention and Perception: Attention and perception normal.        Mood and Affect: Mood and affect normal.        Speech: Speech normal.        Behavior: Behavior normal. Behavior is cooperative.        Thought Content: Thought content normal.        Cognition and Memory: Cognition and memory normal.  Judgment: Judgment normal.           Assessment & Plan:  A-finger laceration left index without foreign body or nail damage  P-Patient was instructed  Do not soak left hand in dirty water until fully healed.  Avoid pool, lake, hot tub, dirty sink water. May shower apply neosporin BID keep wound covered they will heal faster and prevent contamination rubbing from clothing tearing off scabs.  Given triple antibiotic and bandaids from clinic stop change at least daily after washing with soap and water.  Change prn soiling. Exitcare handout on laceration  Patient to notify me if red streaks, discharge, worsening pain or swelling e.g. signs of infection. Medications as directed. Call or return to clinic as needed if these symptoms worsen or fail to improve as anticipated. Patient verbalized agreement and understanding of treatment plan. P2: ROM, injury prevention

## 2023-12-15 ENCOUNTER — Encounter: Payer: Self-pay | Admitting: Registered Nurse

## 2023-12-15 ENCOUNTER — Telehealth: Payer: Self-pay | Admitting: Registered Nurse

## 2023-12-15 DIAGNOSIS — Z0000 Encounter for general adult medical examination without abnormal findings: Secondary | ICD-10-CM

## 2023-12-15 NOTE — Telephone Encounter (Signed)
 Patient met requirements for [employer] insurance discount with employer executive panel Hgba1c drawn 10/19/2023 LDL 104 Hgba1c 8.6 PCM appt BP 126/72 NP Clarke 235lbs height 5/7" BMI 36.81  Awaiting forms from employer expected 08 Jan 2024.

## 2023-12-21 DIAGNOSIS — E1165 Type 2 diabetes mellitus with hyperglycemia: Secondary | ICD-10-CM

## 2023-12-22 ENCOUNTER — Other Ambulatory Visit (HOSPITAL_BASED_OUTPATIENT_CLINIC_OR_DEPARTMENT_OTHER): Payer: Self-pay

## 2023-12-22 MED ORDER — TIRZEPATIDE 5 MG/0.5ML ~~LOC~~ SOAJ
5.0000 mg | SUBCUTANEOUS | 0 refills | Status: DC
  Filled 2023-12-22: qty 6, 84d supply, fill #0

## 2023-12-24 ENCOUNTER — Other Ambulatory Visit (HOSPITAL_BASED_OUTPATIENT_CLINIC_OR_DEPARTMENT_OTHER): Payer: Self-pay

## 2023-12-25 ENCOUNTER — Other Ambulatory Visit (HOSPITAL_BASED_OUTPATIENT_CLINIC_OR_DEPARTMENT_OTHER): Payer: Self-pay

## 2023-12-26 ENCOUNTER — Ambulatory Visit

## 2023-12-26 ENCOUNTER — Other Ambulatory Visit (HOSPITAL_BASED_OUTPATIENT_CLINIC_OR_DEPARTMENT_OTHER): Payer: Self-pay

## 2023-12-26 DIAGNOSIS — L247 Irritant contact dermatitis due to plants, except food: Secondary | ICD-10-CM

## 2023-12-26 NOTE — Progress Notes (Signed)
 Has a severe rash that itches in the inner part of her left elbow spreading to her upper arm

## 2023-12-27 ENCOUNTER — Ambulatory Visit: Admitting: Registered Nurse

## 2023-12-27 ENCOUNTER — Encounter: Payer: Self-pay | Admitting: Registered Nurse

## 2023-12-27 VITALS — Resp 16

## 2023-12-27 DIAGNOSIS — L247 Irritant contact dermatitis due to plants, except food: Secondary | ICD-10-CM

## 2023-12-27 MED ORDER — DIPHENHYDRAMINE HCL 2 % EX GEL: 1.0000 | Freq: Two times a day (BID) | CUTANEOUS | Status: AC | PRN

## 2023-12-27 MED ORDER — AQUAPHOR EX OINT: TOPICAL_OINTMENT | CUTANEOUS | Status: DC | PRN

## 2023-12-27 MED ORDER — PREDNISONE 10 MG PO TABS: ORAL_TABLET | ORAL | Status: AC

## 2023-12-27 NOTE — Progress Notes (Signed)
 Employee has a rash on her left antecubital and now has spread to both inner thighs. In to see NP for assessment.

## 2023-12-27 NOTE — Progress Notes (Signed)
 Subjective:    Patient ID: Diana Lutz, female    DOB: 08-22-1966, 58 y.o.   MRN: [id number]  57y/o caucasian female established patient here for reoccuring rash arms/legs.  Noted it started this weekend started to apply more lotion and rash on torso resolved but left arm worsened saw RN Olegario Messier yesterday given hydrocortisone !% ointment and helped a little left elbow less red but still itching.  Rash on thighs spreading now also.  Denied fever/chills/discharge.  Has been scratching would like some prednisone tabs.  Dogs sleeping with her in bed and has been working in yard.      Review of Systems  Constitutional:  Negative for chills, diaphoresis and fever.  HENT:  Negative for congestion, dental problem, drooling, facial swelling, mouth sores, nosebleeds, trouble swallowing and voice change.   Eyes:  Positive for itching. Negative for photophobia, discharge, redness and visual disturbance.  Respiratory:  Negative for cough, choking, chest tightness, shortness of breath, wheezing and stridor.   Gastrointestinal:  Negative for diarrhea, nausea and vomiting.  Genitourinary:  Negative for difficulty urinating.  Musculoskeletal:  Negative for gait problem, neck pain and neck stiffness.  Skin:  Positive for color change and rash.  Allergic/Immunologic: Positive for environmental allergies.  Neurological:  Negative for tremors, seizures, syncope, facial asymmetry, speech difficulty, weakness, light-headedness, numbness and headaches.  Hematological:  Negative for adenopathy. Does not bruise/bleed easily.  Psychiatric/Behavioral:  Negative for agitation, confusion and sleep disturbance.        Objective:   Physical Exam Vitals and nursing note reviewed.  Constitutional:      General: She is awake. She is not in acute distress.    Appearance: Normal appearance. She is well-developed, well-groomed and overweight. She is not ill-appearing, toxic-appearing or diaphoretic.  HENT:      Head: Normocephalic and atraumatic.     Jaw: There is normal jaw occlusion.     Salivary Glands: Right salivary gland is not diffusely enlarged or tender. Left salivary gland is not diffusely enlarged or tender.     Right Ear: Hearing and external ear normal. No decreased hearing noted. No drainage or swelling.     Left Ear: Hearing and external ear normal. No decreased hearing noted. No drainage or swelling.     Nose: Nose normal. No congestion or rhinorrhea.     Right Nostril: No epistaxis.     Left Nostril: No epistaxis.     Right Turbinates: Not enlarged, swollen or pale.     Left Turbinates: Not enlarged, swollen or pale.     Mouth/Throat:     Lips: Pink. No lesions.     Mouth: Mucous membranes are moist. No oral lesions or angioedema.     Dentition: No gum lesions.     Tongue: No lesions. Tongue does not deviate from midline.     Palate: No mass and lesions.     Pharynx: Oropharynx is clear. Uvula midline. No oropharyngeal exudate or uvula swelling.     Tonsils: No tonsillar exudate.  Eyes:     General: Lids are normal. Vision grossly intact. Gaze aligned appropriately. Allergic shiner present. No scleral icterus.       Right eye: No discharge.        Left eye: No discharge.     Extraocular Movements: Extraocular movements intact.     Conjunctiva/sclera: Conjunctivae normal.     Pupils: Pupils are equal, round, and reactive to light.  Neck:     Trachea: Trachea and phonation normal.  Cardiovascular:     Rate and Rhythm: Normal rate and regular rhythm.     Pulses: Normal pulses.          Radial pulses are 2+ on the right side and 2+ on the left side.     Heart sounds: Normal heart sounds, S1 normal and S2 normal.  Pulmonary:     Effort: Pulmonary effort is normal. No respiratory distress.     Breath sounds: Normal breath sounds and air entry. No stridor or transmitted upper airway sounds. No decreased breath sounds, wheezing, rhonchi or rales.     Comments: Spoke full  sentences without difficulty; no cough observed in exam room Abdominal:     General: Abdomen is flat.  Musculoskeletal:        General: Swelling and tenderness present. No deformity or signs of injury. Normal range of motion.     Left elbow: Swelling present. No deformity or lacerations. Normal range of motion. Tenderness present.     Right forearm: No swelling, edema, deformity, lacerations, tenderness or bony tenderness.     Left forearm: Swelling, edema and tenderness present. No deformity, lacerations or bony tenderness.     Right wrist: No swelling, deformity, effusion, lacerations or crepitus. Normal range of motion.     Left wrist: No swelling, deformity, effusion, lacerations or crepitus. Normal range of motion.     Right hand: No swelling. Normal range of motion. Normal strength. Normal capillary refill.     Left hand: No swelling. Normal range of motion. Normal strength. Normal capillary refill.     Cervical back: Normal range of motion and neck supple. No edema, erythema, signs of trauma, rigidity, torticollis, tenderness or crepitus. No pain with movement. Normal range of motion.     Right lower leg: No edema.     Left lower leg: No edema.  Lymphadenopathy:     Head:     Right side of head: No submandibular or preauricular adenopathy.     Left side of head: No submandibular or preauricular adenopathy.     Cervical: No cervical adenopathy.     Right cervical: No superficial, deep or posterior cervical adenopathy.    Left cervical: No superficial, deep or posterior cervical adenopathy.  Skin:    General: Skin is warm and dry.     Capillary Refill: Capillary refill takes less than 2 seconds.     Coloration: Skin is not ashen, cyanotic, jaundiced, mottled, pale or sallow.     Findings: Abrasion, erythema and rash present. No abscess, acne, bruising, burn, ecchymosis, signs of injury, laceration, lesion, petechiae or wound. Rash is macular and papular. Rash is not crusting, nodular,  purpuric, pustular, scaling, urticarial or vesicular.     Nails: There is no clubbing.          Comments: Left anterior elbow/forearm/upper arm macular papular rash dry linear abrasions noted 5x7cm; bilateral upper legs with rash patient wearing jeans did not want to disrobe at this time  Neurological:     General: No focal deficit present.     Mental Status: She is alert and oriented to person, place, and time. Mental status is at baseline.     GCS: GCS eye subscore is 4. GCS verbal subscore is 5. GCS motor subscore is 6.     Cranial Nerves: No cranial nerve deficit, dysarthria or facial asymmetry.     Sensory: Sensation is intact.     Motor: Motor function is intact. No weakness, tremor, atrophy, abnormal muscle tone or  seizure activity.     Coordination: Coordination is intact. Coordination normal.     Gait: Gait is intact. Gait normal.     Comments: In/out of chair without difficulty; gait sure and steady in clinic; bilateral hand grasp equal 5/5  Psychiatric:        Attention and Perception: Attention and perception normal.        Mood and Affect: Mood and affect normal.        Speech: Speech normal.        Behavior: Behavior normal. Behavior is cooperative.        Thought Content: Thought content normal.        Cognition and Memory: Cognition and memory normal.        Judgment: Judgment normal.       Last prednisone taper 10/16/23 for pruritus/contact dermatitis    Assessment & Plan:  A-contact dermatitis unspecified  P-consider washing and changing bed linens.  Not letting dogs sleep with her tonight.  Start prednisone 10mg  taper discussed 2-3 week course using lower dose 40mg  x 2 days 30mg x2 days 20mg  x 3 days #21 RF0 dispensed from [facility] to patient and re-evaluation on Tuesday 01 Jan 2024. May continue OTC antihistamine and topical calagel BID prn prn itching.  Given 2 UD from clinic stock  Discussed if clothes rubbing rash apply telfa gauze with aquaphor topical first under  telfa. OTC  zyrtec 10mg  po BID prn itching.  calagel thin smear BID prn itching  do not get in eyes; if worsening with calagel use stop and trial hydrocortisone 1% topical BID small smear affected area.  Wash hands before and after application.  Avoid hot steam showers.  Apply emollient twice a day e.g. Fragrance free vaseline/aquaphor.  May apply ice/cold compress 5 minutes QID prn itching/swelling.  Avoid scratching as can lead to secondary infection no discharge or red streaks noted today.  Avoid steam showers.  Shower when she finishes work/prior to bedtime.  Avoid harsh/abrasive soaps use fragrance free/sensitive like dove/cetaphil.    Medication as directed. Call or return to clinic as needed if these symptoms worsen or fail to improve as anticipated. Exitcare handouts on contact dermatitis and pruritis  Follow up for re-evaluation sooner if in 48 hours no improvement and/or worsening of rash with plan of care. Patient verbalized agreement and understanding of treatment plan and had no further questions at this time

## 2023-12-27 NOTE — Patient Instructions (Signed)
 Contact Dermatitis Dermatitis is redness, soreness, and swelling (inflammation) of the skin. Contact dermatitis is a reaction to certain substances that touch the skin. There are two types of this condition: Irritant contact dermatitis. This is the most common type. It happens when something irritates your skin, such as when your hands get dry from washing them too often with soap. You can get this type of reaction even if you have not been exposed to the irritant before. Allergic contact dermatitis. This type is caused by a substance that you are allergic to, such as poison ivy. It occurs when you have been exposed to the substance (allergen) and form a sensitivity to it. In some cases, the reaction may start soon after your first exposure to the allergen. In other cases, it may not start until you are exposed to the allergen again. It may then occur every time you are exposed to the allergen in the future. What are the causes? Irritant contact dermatitis is often caused by exposure to: Makeup. Soaps, detergents, and bleaches. Acids. Metal salts, such as nickel. Allergic contact dermatitis is often caused by exposure to: Poisonous plants. Chemicals. Jewelry. Latex. Medicines. Preservatives in products, such as clothes. What increases the risk? You are more likely to get this condition if you have: A job that exposes you to irritants or allergens. Certain medical conditions. These include asthma and eczema. What are the signs or symptoms? Symptoms of this condition may occur in any place on your body that has been touched by the irritant. Symptoms include: Dryness, flaking, or cracking. Redness. Itching. Pain or a burning feeling. Blisters. Drainage of small amounts of blood or clear fluid from skin cracks. With allergic contact dermatitis, there may also be swelling in areas such as the eyelids, mouth, or genitals. How is this diagnosed? This condition is diagnosed with a medical  history and physical exam. A patch skin test may be done to help figure out the cause. If the condition is related to your job, you may need to see an expert in health problems in the workplace (occupational medicine specialist). How is this treated? This condition is treated by staying away from the cause of the reaction and protecting your skin from further contact. Treatment may also include: Steroid creams or ointments. Steroid medicines may need be taken by mouth (orally) in more severe cases. Antibiotics or medicines applied to the skin to kill bacteria (antibacterial ointments). These may be needed if a skin infection is present. Antihistamines. These may be taken orally or put on as a lotion to ease itching. A bandage (dressing). Follow these instructions at home: Skin care Moisturize your skin as needed. Put cool, wet cloths (cool compresses) on the affected areas. Try applying baking soda paste to your skin. Stir water into baking soda until it has the consistency of a paste. Do not scratch your skin. Avoid friction to the affected area. Avoid the use of soaps, perfumes, and dyes. Check the affected areas every day for signs of infection. Check for: More redness, swelling, or pain. More fluid or blood. Warmth. Pus or a bad smell. Medicines Take or apply over-the-counter and prescription medicines only as told by your health care provider. If you were prescribed antibiotics, take or apply them as told by your health care provider. Do not stop using the antibiotic even if you start to feel better. Bathing Try taking a bath with: Epsom salts. Follow the instructions on the packaging. You can get these at your local pharmacy  or grocery store. Baking soda. Pour a small amount into the bath as told by your health care provider. Colloidal oatmeal. Follow the instructions on the packaging. You can get this at your local pharmacy or grocery store. Bathe less often. This may mean bathing  every other day. Bathe in lukewarm water. Avoid using hot water. Bandage care If you were given a dressing, change it as told by your health care provider. Wash your hands with soap and water for at least 20 seconds before and after you change your dressing. If soap and water are not available, use hand sanitizer. General instructions Avoid the substance that caused your reaction. If you do not know what caused it, keep a journal to try to track what caused it. Write down: What you eat and drink. What cosmetics you use. What you wear in the affected area. This includes jewelry. Contact a health care provider if: Your condition does not get better with treatment. Your condition gets worse. You have any signs of infection. You have a fever. You have new symptoms. Your bone or joint under the affected area becomes painful after the skin has healed. Get help right away if: You notice red streaks coming from the affected area. The affected area turns darker. You have trouble breathing. This information is not intended to replace advice given to you by your health care provider. Make sure you discuss any questions you have with your health care provider. Document Revised: 03/31/2022 Document Reviewed: 03/31/2022 Elsevier Patient Education  2024 Elsevier Inc.Pruritus Pruritus is an itchy feeling on the skin. One of the most common causes is dry skin, but many different things can cause itching. Most cases of itching do not require medical attention. Sometimes itchy skin can turn into a rash or a secondary infection. Follow these instructions at home: Skin care  Do not use scented soaps, detergents, perfumes, and cosmetic products. Instead, use gentle, unscented versions of these items. Apply moisturizing creams to your skin frequently, at least twice daily. Apply immediately after bathing while skin is still wet. Take medicines or apply medicated creams only as told by your health care  provider. This may include: Corticosteroid cream or topical calcineurin inhibitor. Anti-itch lotions containing urea, camphor, or menthol. Oral antihistamines. Do not take hot showers or baths, which can make itching worse. A short, cool shower may help with itching as long as you apply moisturizing lotion after the shower. Apply a cool, wet cloth (cool compress) to the affected areas. You may take lukewarm baths with one of the following: Epsom salts. You can get these at your local pharmacy or grocery store. Follow the instructions on the packaging. Baking soda. Pour a small amount into the bath as told by your health care provider. Colloidal oatmeal. You can get this at your local pharmacy or grocery store. Follow the instructions on the packaging. Do not scratch your skin. General instructions Avoid wearing tight clothes. Keep a journal to help find out what is causing your itching. Write down: What you eat and drink. What cosmetic products you use. What soaps or detergents you use. What you wear, including jewelry. Use a humidifier. This keeps the air moist, which helps to prevent dry skin. Be aware of any changes in your itchiness. Tell your health care provider about any changes. Contact a health care provider if: The itching does not go away after several days. You notice redness, warmth, or drainage on the skin where you have scratched. You are unusually thirsty  or urinating more than normal. Your skin tingles or feels numb. Your skin or the white parts of your eyes turn yellow (jaundice). You feel weak. You have any of the following: Night sweats. Tiredness (fatigue). Weight loss. Abdominal pain. Summary Pruritus is an itchy feeling on the skin. One of the most common causes is dry skin, but many different conditions and factors can cause itching. Apply moisturizing creams to your skin frequently, at least twice daily. Apply immediately after bathing while skin is still  wet. Take medicines or apply medicated creams only as told by your health care provider. Do not take hot showers or baths. Do not use scented soaps, detergents, perfumes, or cosmetic products. Keep a journal to help find out what is causing your itching. This information is not intended to replace advice given to you by your health care provider. Make sure you discuss any questions you have with your health care provider. Document Revised: 11/02/2021 Document Reviewed: 11/02/2021 Elsevier Patient Education  2024 [employer].

## 2024-01-23 ENCOUNTER — Ambulatory Visit: Payer: No Typology Code available for payment source | Admitting: Primary Care

## 2024-01-23 ENCOUNTER — Other Ambulatory Visit (HOSPITAL_COMMUNITY)
Admission: RE | Admit: 2024-01-23 | Discharge: 2024-01-23 | Disposition: A | Discharge status: Home / Self Care | Source: Ambulatory Visit | Attending: Primary Care | Admitting: Primary Care

## 2024-01-23 VITALS — BP 124/64 | HR 70 | Temp 97.8°F | Ht 67.0 in | Wt 217.0 lb

## 2024-01-23 DIAGNOSIS — Z7985 Long-term (current) use of injectable non-insulin antidiabetic drugs: Secondary | ICD-10-CM

## 2024-01-23 DIAGNOSIS — Z7984 Long term (current) use of oral hypoglycemic drugs: Secondary | ICD-10-CM

## 2024-01-23 DIAGNOSIS — E1165 Type 2 diabetes mellitus with hyperglycemia: Secondary | ICD-10-CM | POA: Diagnosis not present

## 2024-01-23 DIAGNOSIS — Z124 Encounter for screening for malignant neoplasm of cervix: Secondary | ICD-10-CM | POA: Insufficient documentation

## 2024-01-23 LAB — POCT GLYCOSYLATED HEMOGLOBIN (HGB A1C): Hemoglobin A1C: 6 % — AB (ref 4.0–5.6)

## 2024-01-23 LAB — MICROALBUMIN / CREATININE URINE RATIO
Creatinine,U: 194.7 mg/dL
Microalb Creat Ratio: 5.4 mg/g (ref 0.0–30.0)
Microalb, Ur: 1.1 mg/dL (ref 0.0–1.9)

## 2024-01-23 NOTE — Progress Notes (Signed)
 Subjective:    Patient ID: Diana Lutz, female    DOB: 1966-10-07, 58 y.o.   MRN: [id number]  HPI  Diana Lutz is a very pleasant 58 y.o. female with a history of hypertension, type 2 diabetes, hyperlipidemia who presents today for follow-up of diabetes and Pap smear.  She is due for a pap smear. Last pap smear was >5 years ago.   Current medications include: Metformin ER 1000 mg daily, Mounjaro 5 mg weekly.  She denies bad side effects from [facility]. She is overall feeling much better, doesn't have "food noise" like she once did. Is able to quit eating when full. She is also making healthier choices.   She is checking her blood glucose 0 times daily.  Last A1C: 8.6 in January 25, 6.0 today Last Eye Exam: Up-to-date Last Foot Exam: Up-to-date Pneumonia Vaccination: 2021 Urine Microalbumin: Due Statin: Rosuvastatin  Dietary changes since last visit: Increased intake of protein, limiting carbs, increased fruit and veggies, and smaller portion sizes.   Exercise: Walking  Wt Readings from Last 3 Encounters:  01/23/24 217 lb (98.4 kg)  10/24/23 235 lb (106.6 kg)  03/20/23 232 lb (105.2 kg)   BP Readings from Last 3 Encounters:  01/23/24 124/64  10/24/23 126/72  10/11/23 130/80      Review of Systems  Eyes:  Negative for visual disturbance.  Gastrointestinal:  Negative for abdominal pain, constipation and diarrhea.  Neurological:  Negative for numbness.         Past Medical History:  Diagnosis Date   Abnormal Pap smear of cervix 1991   Acute right-sided thoracic back pain 05/17/2021   Anemia    history of anemia   Diabetes mellitus without complication (HCC)    Dysplasia of cervix    cervical ca   GERD (gastroesophageal reflux disease)    Hx of adenomatous polyp of colon 05/30/2020   Hypertension    Migraines    NAFLD (nonalcoholic fatty liver disease) 40/98/1191   Right upper quadrant abdominal pain 06/15/2021    Social History    Socioeconomic History   Marital status: Married    Spouse name: Not on file   Number of children: Not on file   Years of education: Not on file   Highest education level: Associate degree: occupational, Scientist, product/process development, or vocational program  Occupational History   Not on file  Tobacco Use   Smoking status: Never   Smokeless tobacco: Never  Vaping Use   Vaping status: Never Used  Substance and Sexual Activity   Alcohol use: Not Currently    Alcohol/week: 0.0 standard drinks of alcohol    Comment: social   Drug use: No   Sexual activity: Not Currently  Other Topics Concern   Not on file  Social History Narrative   Married.   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   Enjoys four wheeling, working on the house   Social Drivers of Health   Financial Resource Strain: Low Risk  (10/24/2023)   Overall Financial Resource Strain (CARDIA)    Difficulty of Paying Living Expenses: Not very hard  Food Insecurity: No Food Insecurity (10/24/2023)   Hunger Vital Sign    Worried About Running Out of Food in the Last Year: Never true    Ran Out of Food in the Last Year: Never true  Transportation Needs: No Transportation Needs (10/24/2023)   PRAPARE - Transportation    Lack of Transportation (Medical): No    Lack of  Transportation (Non-Medical): No  Physical Activity: Insufficiently Active (10/24/2023)   Exercise Vital Sign    Days of Exercise per Week: 3 days    Minutes of Exercise per Session: 20 min  Stress: No Stress Concern Present (10/24/2023)   [employer] of Occupational Health - Occupational Stress Questionnaire    Feeling of Stress : Not at all  Social Connections: Socially Integrated (10/24/2023)   Social Connection and Isolation Panel [NHANES]    Frequency of Communication with Friends and Family: More than three times a week    Frequency of Social Gatherings with Friends and Family: Twice a week    Attends Religious Services: More than 4 times per year     Active Member of [employer] or Organizations: No    Attends Engineer, structural: More than 4 times per year    Marital Status: Married  Catering manager Violence: Unknown (01/11/2022)   Received from [facility], [facility]   HITS    Physically Hurt: Not on file    Insult or Talk Down To: Not on file    Threaten Physical Harm: Not on file    Scream or Curse: Not on file    Past Surgical History:  Procedure Laterality Date   ANTERIOR AND POSTERIOR REPAIR N/A 08/18/2015   Procedure: ANTERIOR (CYSTOCELE) AND POSTERIOR REPAIR (RECTOCELE)/Perineorrhaphy;  Surgeon: Terri Fester, MD;  Location: [facility];  Service: Gynecology;  Laterality: N/A;  2 hrs.   BLADDER SUSPENSION N/A 08/18/2015   Procedure: TRANSVAGINAL TAPE (TVT) Mid-Urethral Sling PROCEDURE;  Surgeon: Terri Fester, MD;  Location: [facility];  Service: Gynecology;  Laterality: N/A;   COLONOSCOPY  11/15/2005   normal-Dr.Gessner   CYSTOSCOPY N/A 08/18/2015   Procedure: CYSTOSCOPY;  Surgeon: Terri Fester, MD;  Location: [facility];  Service: Gynecology;  Laterality: N/A;   HYSTEROSCOPY W/ ENDOMETRIAL ABLATION     LASER ABLATION OF THE CERVIX     WISDOM TOOTH EXTRACTION      Family History  Problem Relation Age of Onset   Hypertension Mother    Diverticulosis Mother    Colon polyps Mother    Hypertension Father    Stroke Father    Heart attack Father    Prostate cancer Father    Bladder Cancer Father    Hypertension Brother    Diabetes Brother    Colon cancer Maternal Grandmother 73   Esophageal cancer Neg Hx    Rectal cancer Neg Hx    Stomach cancer Neg Hx    Breast cancer Neg Hx     No Known Allergies  Current Outpatient Medications on File Prior to Visit  Medication Sig Dispense Refill   albuterol (VENTOLIN HFA) 108 (90 Base) MCG/ACT inhaler Inhale 1-2 puffs into the lungs every 4 (four) hours as needed for wheezing or shortness of breath. 1 each 0   amLODipine (NORVASC) 10 MG tablet Take 1 tablet (10 mg total) by  mouth daily. 90 tablet 2   calcium carbonate (TUMS - DOSED IN MG ELEMENTAL CALCIUM) 500 MG chewable tablet Chew 2 tablets by mouth daily as needed for indigestion or heartburn.     Cholecalciferol (D3 5000) 125 MCG (5000 UT) capsule Take 1 capsule (5,000 Units total) by mouth daily. Take with meal 200 capsule 3   [facility] LUBRICANT EYE DROPS 0.4-0.3 % SOLN APPLY 1 DROP TO EYE 3 (THREE) TIMES DAILY.  12   cyclobenzaprine (FLEXERIL) 10 MG tablet Take 0.5-1 tablets (5-10 mg total) by mouth 3 (three) times daily as needed  for muscle spasms. 30 tablet 0   fluticasone (FLONASE) 50 MCG/ACT nasal spray Place 1 spray into both nostrils 2 (two) times daily. 48 mL 2   ibuprofen (ADVIL,MOTRIN) 600 MG tablet Take 600 mg by mouth daily as needed.     losartan (COZAAR) 100 MG tablet TAKE 1 TABLET BY MOUTH EVERY DAY FOR BLOOD PRESSURE 90 tablet 3   metFORMIN (GLUCOPHAGE-XR) 500 MG 24 hr tablet Take 2 tablets (1,000 mg total) by mouth daily with breakfast. for diabetes. 180 tablet 0   omeprazole (PRILOSEC) 20 MG capsule Take 1 capsule (20 mg total) by mouth daily. 90 capsule 2   rosuvastatin (CRESTOR) 5 MG tablet Take 1 tablet (5 mg total) by mouth daily.     sodium chloride (OCEAN) 0.65 % SOLN nasal spray Place 2 sprays into both nostrils every 2 (two) hours while awake.     tirzepatide (MOUNJARO) 5 MG/0.5ML Pen Inject 5 mg into the skin once a week. for diabetes. 6 mL 0   meloxicam (MOBIC) 7.5 MG tablet Take 1 tablet by mouth daily. (Patient not taking: Reported on 01/23/2024)     methocarbamol (ROBAXIN) 500 MG tablet Take 1 tablet by mouth 2 (two) times daily. (Patient not taking: Reported on 01/23/2024)     No current facility-administered medications on file prior to visit.    BP 124/64   Pulse 70   Temp 97.8 F (36.6 C) (Oral)   Ht 5\' 7"  (1.702 m)   Wt 217 lb (98.4 kg)   SpO2 98%   BMI 33.99 kg/m  Objective:   Physical Exam Exam conducted with a chaperone present.  Cardiovascular:     Rate and Rhythm:  Normal rate and regular rhythm.  Pulmonary:     Effort: Pulmonary effort is normal.     Breath sounds: Normal breath sounds.  Genitourinary:    Labia:        Right: No rash, tenderness or lesion.        Left: No rash, tenderness or lesion.      Vagina: Normal.     Cervix: Normal.     Uterus: Normal.      Adnexa: Right adnexa normal and left adnexa normal.  Musculoskeletal:     Cervical back: Neck supple.  Skin:    General: Skin is warm and dry.  Neurological:     Mental Status: She is alert and oriented to person, place, and time.  Psychiatric:        Mood and Affect: Mood normal.           Assessment & Plan:  Type 2 diabetes mellitus with hyperglycemia, without long-term current use of insulin (HCC) Assessment & Plan: Improved and controlled with A1C of 6.0 today!!!  Commended her on weight loss, encouraged to continue to work on a healthy diet and exercise.  Continue metformin ER 1000 mg daily and Mounjaro 5 mg weekly.  Follow up in 3 months.  Orders: -     POCT glycosylated hemoglobin (Hb A1C) -     Microalbumin / creatinine urine ratio  Screening for cervical cancer -     Cytology - PAP        Gabriel John, NP

## 2024-01-23 NOTE — Assessment & Plan Note (Signed)
 Improved and controlled with A1C of 6.0 today!!!  Commended her on weight loss, encouraged to continue to work on a healthy diet and exercise.  Continue metformin ER 1000 mg daily and Mounjaro 5 mg weekly.  Follow up in 3 months.

## 2024-01-23 NOTE — Patient Instructions (Signed)
 Stop by the lab prior to leaving today. I will notify you of your results once received.   Continue metformin and Mounjaro as prescribed.  Schedule follow-up visit in 3 months for diabetes check.  It was a pleasure to see you today!

## 2024-01-25 LAB — CYTOLOGY - PAP
Adequacy: ABSENT
Comment: NEGATIVE
Diagnosis: NEGATIVE
High risk HPV: NEGATIVE

## 2024-01-29 ENCOUNTER — Encounter: Payer: Self-pay | Admitting: Registered Nurse

## 2024-01-29 ENCOUNTER — Telehealth: Payer: Self-pay | Admitting: Registered Nurse

## 2024-01-29 DIAGNOSIS — E1165 Type 2 diabetes mellitus with hyperglycemia: Secondary | ICD-10-CM

## 2024-01-29 DIAGNOSIS — I10 Essential (primary) hypertension: Secondary | ICD-10-CM

## 2024-01-29 DIAGNOSIS — Z0000 Encounter for general adult medical examination without abnormal findings: Secondary | ICD-10-CM

## 2024-01-29 MED ORDER — AMLODIPINE BESYLATE 10 MG PO TABS: 10.0000 mg | ORAL_TABLET | Freq: Every day | ORAL | 3 refills | Status: DC

## 2024-01-29 MED ORDER — SALINE SPRAY 0.65 % NA SOLN: 2.0000 | NASAL | Status: AC

## 2024-01-29 NOTE — Telephone Encounter (Signed)
 Patient last filled 90 day supply amlodipine  10/25/23 BP PCM 126/72 HR 75 Labs stable   Dispensed 90 day supply amlodipine  10mg  po daily today from [facility]  Patient reported she has been intentionally losing weight and Hgba1c now 6 and weight 217lbs down from 239lbs.   Latest Reference Range & Units 10/19/23 08:40  Sodium 134 - 144 mmol/L 139  Potassium 3.5 - 5.2 mmol/L 4.5  Chloride 96 - 106 mmol/L 101  Glucose 70 - 99 mg/dL 161 (H)  BUN 6 - 24 mg/dL 13  Creatinine 0.96 - 0.45 mg/dL 4.09  Calcium  8.7 - 10.2 mg/dL 9.3  BUN/Creatinine Ratio 9 - 23  16  eGFR >59 mL/min/1.73 82  Phosphorus 3.0 - 4.3 mg/dL 3.2  Alkaline Phosphatase 44 - 121 IU/L 145 (H)  Albumin 3.8 - 4.9 g/dL 4.5  Uric Acid 3.0 - 7.2 mg/dL 2.9 (L)  AST 0 - 40 IU/L 20  ALT 0 - 32 IU/L 37 (H)  Total Protein 6.0 - 8.5 g/dL 6.8  Total Bilirubin 0.0 - 1.2 mg/dL 0.7  GGT 0 - 60 IU/L 46  Estimated CHD Risk 0.0 - 1.0 times avg.  < 0.5  LDH 119 - 226 IU/L 171  Total CHOL/HDL Ratio 0.0 - 4.4 ratio 3.2  Cholesterol, Total 100 - 199 mg/dL 811  HDL Cholesterol >91 mg/dL 56  Triglycerides 0 - 478 mg/dL 295  VLDL Cholesterol Cal 5 - 40 mg/dL 21  LDL Chol Calc (NIH) 0 - 99 mg/dL 621 (H)  Iron 27 - 308 ug/dL 657  Vitamin D , 25-Hydroxy 30.0 - 100.0 ng/mL 45.3  Globulin, Total 1.5 - 4.5 g/dL 2.3  WBC 3.4 - 84.6 N62X5/MW 8.6  RBC 3.77 - 5.28 x10E6/uL 5.03  Hemoglobin 11.1 - 15.9 g/dL 41.3  HCT 24.4 - 01.0 % 46.1  MCV 79 - 97 fL 92  MCH 26.6 - 33.0 pg 30.4  MCHC 31.5 - 35.7 g/dL 27.2  RDW 53.6 - 64.4 % 13.2  Platelets 150 - 450 x10E3/uL 206  Hematology Comments:   Note:  Neutrophils Not Estab. % 58  Immature Granulocytes Not Estab. % 1  NEUT# 1.4 - 7.0 x10E3/uL 5.2  Lymphs Abs 0.7 - 3.1 x10E3/uL 2.6  Monocytes Absolute 0.1 - 0.9 x10E3/uL 0.7  Basophils Absolute 0.0 - 0.2 x10E3/uL 0.1  Immature Grans (Abs) 0.0 - 0.1 x10E3/uL 0.1  Lymphs Not Estab. % 30  Monocytes Not Estab. % 8  Basos Not Estab. % 1  Eos Not Estab. % 2  EOS  (ABSOLUTE) 0.0 - 0.4 x10E3/uL 0.1  Hemoglobin A1C 4.8 - 5.6 % 8.6 (H)  TSH 0.450 - 4.500 uIU/mL 1.350  Thyroxine (T4) 4.5 - 12.0 ug/dL 9.9  Free Thyroxine Index 1.2 - 4.9  2.9  T3 Uptake Ratio 24 - 39 % 29

## 2024-01-29 NOTE — Telephone Encounter (Signed)
 Patient reported her weight loss efforts have been going well.  Very happy with her improved Hgba1c also decreased from 8.6 January to 6.0 01/23/24 at [facility].  Requested refill amlodipine  only at this time [facility] dispensed 90 days supply to patient.  Last BP on file 124/64 HR 70 PCM office weight 217lbs BMI 33.99  and 10/04/2022 weight 239lbs   Congratulated patient on her success  Patient met requirements for [employer] insurance discount starting 09 Jul 2024.  Patient verbalized understanding information/instructions and had no further questions at this time.

## 2024-02-13 ENCOUNTER — Other Ambulatory Visit: Payer: Self-pay | Admitting: Primary Care

## 2024-02-13 DIAGNOSIS — E1165 Type 2 diabetes mellitus with hyperglycemia: Secondary | ICD-10-CM

## 2024-02-15 ENCOUNTER — Other Ambulatory Visit (HOSPITAL_BASED_OUTPATIENT_CLINIC_OR_DEPARTMENT_OTHER): Payer: Self-pay

## 2024-02-15 ENCOUNTER — Other Ambulatory Visit: Payer: Self-pay | Admitting: Primary Care

## 2024-02-15 DIAGNOSIS — E1165 Type 2 diabetes mellitus with hyperglycemia: Secondary | ICD-10-CM

## 2024-02-15 MED ORDER — METFORMIN HCL ER 500 MG PO TB24
1000.0000 mg | ORAL_TABLET | Freq: Every day | ORAL | 0 refills | Status: DC
  Filled 2024-02-15: qty 180, 90d supply, fill #0

## 2024-02-25 ENCOUNTER — Other Ambulatory Visit (HOSPITAL_BASED_OUTPATIENT_CLINIC_OR_DEPARTMENT_OTHER): Payer: Self-pay

## 2024-02-25 MED ORDER — IPRATROPIUM BROMIDE 0.03 % NA SOLN
2.0000 | Freq: Three times a day (TID) | NASAL | 0 refills | Status: AC
Start: 2024-02-25 — End: ?
  Filled 2024-02-25: qty 30, 50d supply, fill #0

## 2024-02-25 MED ORDER — AMOXICILLIN-POT CLAVULANATE 875-125 MG PO TABS
1.0000 | ORAL_TABLET | Freq: Two times a day (BID) | ORAL | 0 refills | Status: DC
  Filled 2024-02-25: qty 14, 7d supply, fill #0

## 2024-02-25 MED ORDER — BENZONATATE 100 MG PO CAPS
100.0000 mg | ORAL_CAPSULE | Freq: Three times a day (TID) | ORAL | 0 refills | Status: DC | PRN
  Filled 2024-02-25: qty 30, 5d supply, fill #0

## 2024-03-06 ENCOUNTER — Telehealth: Payer: Self-pay | Admitting: Registered Nurse

## 2024-03-06 DIAGNOSIS — I10 Essential (primary) hypertension: Secondary | ICD-10-CM

## 2024-03-06 DIAGNOSIS — K219 Gastro-esophageal reflux disease without esophagitis: Secondary | ICD-10-CM

## 2024-03-06 MED ORDER — AMLODIPINE BESYLATE 10 MG PO TABS: 10.0000 mg | ORAL_TABLET | Freq: Every day | ORAL | 1 refills | Status: DC

## 2024-03-06 MED ORDER — OMEPRAZOLE 20 MG PO CPDR: 20.0000 mg | DELAYED_RELEASE_CAPSULE | Freq: Every day | ORAL | 3 refills | Status: AC

## 2024-03-06 NOTE — Telephone Encounter (Signed)
 Patient requested refill omeprazole  20mg  DR po daily last filled greater than 90 days ago from [facility] dispensed 90 capsules to patient today working well for her.  NP will be on vacation when patient due refill amlodipine  10mg  po daily 90 tabs dispensed to patient today from [facility].    Last BP 124.64 HR 70 01/23/24   last labs   Latest Reference Range & Units 10/19/23 08:40  Sodium 134 - 144 mmol/L 139  Potassium 3.5 - 5.2 mmol/L 4.5  Chloride 96 - 106 mmol/L 101  Glucose 70 - 99 mg/dL 161 (H)  BUN 6 - 24 mg/dL 13  Creatinine 0.96 - 0.45 mg/dL 4.09  Calcium  8.7 - 10.2 mg/dL 9.3  BUN/Creatinine Ratio 9 - 23  16  eGFR >59 mL/min/1.73 82  Phosphorus 3.0 - 4.3 mg/dL 3.2  Alkaline Phosphatase 44 - 121 IU/L 145 (H)  Albumin 3.8 - 4.9 g/dL 4.5  Uric Acid 3.0 - 7.2 mg/dL 2.9 (L)  AST 0 - 40 IU/L 20  ALT 0 - 32 IU/L 37 (H)  Total Protein 6.0 - 8.5 g/dL 6.8  Total Bilirubin 0.0 - 1.2 mg/dL 0.7  GGT 0 - 60 IU/L 46  Estimated CHD Risk 0.0 - 1.0 times avg.  < 0.5  LDH 119 - 226 IU/L 171  Total CHOL/HDL Ratio 0.0 - 4.4 ratio 3.2  Cholesterol, Total 100 - 199 mg/dL 811  HDL Cholesterol >91 mg/dL 56  Triglycerides 0 - 478 mg/dL 295  VLDL Cholesterol Cal 5 - 40 mg/dL 21  LDL Chol Calc (NIH) 0 - 99 mg/dL 621 (H)  Iron 27 - 308 ug/dL 657  Vitamin D , 25-Hydroxy 30.0 - 100.0 ng/mL 45.3  Globulin, Total 1.5 - 4.5 g/dL 2.3  WBC 3.4 - 84.6 N62X5/MW 8.6  RBC 3.77 - 5.28 x10E6/uL 5.03  Hemoglobin 11.1 - 15.9 g/dL 41.3  HCT 24.4 - 01.0 % 46.1  MCV 79 - 97 fL 92  MCH 26.6 - 33.0 pg 30.4  MCHC 31.5 - 35.7 g/dL 27.2  RDW 53.6 - 64.4 % 13.2  Platelets 150 - 450 x10E3/uL 206  Hematology Comments:  Note:  Neutrophils Not Estab. % 58  (H): Data is abnormally high (L): Data is abnormally low

## 2024-03-11 ENCOUNTER — Ambulatory Visit: Admitting: Registered Nurse

## 2024-03-11 ENCOUNTER — Encounter: Payer: Self-pay | Admitting: Registered Nurse

## 2024-03-11 VITALS — BP 118/73 | HR 69 | Temp 97.2°F

## 2024-03-11 DIAGNOSIS — L247 Irritant contact dermatitis due to plants, except food: Secondary | ICD-10-CM

## 2024-03-11 MED ORDER — PREDNISONE 10 MG PO TABS: ORAL_TABLET | ORAL | Status: AC

## 2024-03-11 NOTE — Patient Instructions (Addendum)
 Pruritus Pruritus is an itchy feeling on the skin. One of the most common causes is dry skin, but many different things can cause itching. Most cases of itching do not require medical attention. Sometimes itchy skin can turn into a rash or a secondary infection. Follow these instructions at home: Skin care  Do not use scented soaps, detergents, perfumes, and cosmetic products. Instead, use gentle, unscented versions of these items. Apply moisturizing creams to your skin frequently, at least twice daily. Apply immediately after bathing while skin is still wet. Take medicines or apply medicated creams only as told by your health care provider. This may include: Corticosteroid cream or topical calcineurin inhibitor. Anti-itch lotions containing urea, camphor, or menthol . Oral antihistamines. Do not take hot showers or baths, which can make itching worse. A short, cool shower may help with itching as long as you apply moisturizing lotion after the shower. Apply a cool, wet cloth (cool compress) to the affected areas. You may take lukewarm baths with one of the following: Epsom salts. You can get these at your local pharmacy or grocery store. Follow the instructions on the packaging. Baking soda. Pour a small amount into the bath as told by your health care provider. Colloidal oatmeal. You can get this at your local pharmacy or grocery store. Follow the instructions on the packaging. Do not scratch your skin. General instructions Avoid wearing tight clothes. Keep a journal to help find out what is causing your itching. Write down: What you eat and drink. What cosmetic products you use. What soaps or detergents you use. What you wear, including jewelry. Use a humidifier. This keeps the air moist, which helps to prevent dry skin. Be aware of any changes in your itchiness. Tell your health care provider about any changes. Contact a health care provider if: The itching does not go away after  several days. You notice redness, warmth, or drainage on the skin where you have scratched. You are unusually thirsty or urinating more than normal. Your skin tingles or feels numb. Your skin or the white parts of your eyes turn yellow (jaundice). You feel weak. You have any of the following: Night sweats. Tiredness (fatigue). Weight loss. Abdominal pain. Summary Pruritus is an itchy feeling on the skin. One of the most common causes is dry skin, but many different conditions and factors can cause itching. Apply moisturizing creams to your skin frequently, at least twice daily. Apply immediately after bathing while skin is still wet. Take medicines or apply medicated creams only as told by your health care provider. Do not take hot showers or baths. Do not use scented soaps, detergents, perfumes, or cosmetic products. Keep a journal to help find out what is causing your itching. This information is not intended to replace advice given to you by your health care provider. Make sure you discuss any questions you have with your health care provider. Document Revised: 11/02/2021 Document Reviewed: 11/02/2021 Elsevier Patient Education  2024 [employer].Contact Dermatitis Dermatitis is redness, soreness, and swelling (inflammation) of the skin. Contact dermatitis is a reaction to certain substances that touch the skin. There are two types of this condition: Irritant contact dermatitis. This is the most common type. It happens when something irritates your skin, such as when your hands get dry from washing them too often with soap. You can get this type of reaction even if you have not been exposed to the irritant before. Allergic contact dermatitis. This type is caused by a substance that you  are allergic to, such as poison ivy. It occurs when you have been exposed to the substance (allergen) and form a sensitivity to it. In some cases, the reaction may start soon after your first exposure to the  allergen. In other cases, it may not start until you are exposed to the allergen again. It may then occur every time you are exposed to the allergen in the future. What are the causes? Irritant contact dermatitis is often caused by exposure to: Makeup. Soaps, detergents, and bleaches. Acids. Metal salts, such as nickel. Allergic contact dermatitis is often caused by exposure to: Poisonous plants. Chemicals. Jewelry. Latex. Medicines. Preservatives in products, such as clothes. What increases the risk? You are more likely to get this condition if you have: A job that exposes you to irritants or allergens. Certain medical conditions. These include asthma and eczema. What are the signs or symptoms? Symptoms of this condition may occur in any place on your body that has been touched by the irritant. Symptoms include: Dryness, flaking, or cracking. Redness. Itching. Pain or a burning feeling. Blisters. Drainage of small amounts of blood or clear fluid from skin cracks. With allergic contact dermatitis, there may also be swelling in areas such as the eyelids, mouth, or genitals. How is this diagnosed? This condition is diagnosed with a medical history and physical exam. A patch skin test may be done to help figure out the cause. If the condition is related to your job, you may need to see an expert in health problems in the workplace (occupational medicine specialist). How is this treated? This condition is treated by staying away from the cause of the reaction and protecting your skin from further contact. Treatment may also include: Steroid creams or ointments. Steroid medicines may need be taken by mouth (orally) in more severe cases. Antibiotics or medicines applied to the skin to kill bacteria (antibacterial ointments). These may be needed if a skin infection is present. Antihistamines. These may be taken orally or put on as a lotion to ease itching. A bandage (dressing). Follow  these instructions at home: Skin care Moisturize your skin as needed. Put cool, wet cloths (cool compresses) on the affected areas. Try applying baking soda paste to your skin. Stir water  into baking soda until it has the consistency of a paste. Do not scratch your skin. Avoid friction to the affected area. Avoid the use of soaps, perfumes, and dyes. Check the affected areas every day for signs of infection. Check for: More redness, swelling, or pain. More fluid or blood. Warmth. Pus or a bad smell. Medicines Take or apply over-the-counter and prescription medicines only as told by your health care provider. If you were prescribed antibiotics, take or apply them as told by your health care provider. Do not stop using the antibiotic even if you start to feel better. Bathing Try taking a bath with: Epsom salts. Follow the instructions on the packaging. You can get these at your local pharmacy or grocery store. Baking soda. Pour a small amount into the bath as told by your health care provider. Colloidal oatmeal. Follow the instructions on the packaging. You can get this at your local pharmacy or grocery store. Bathe less often. This may mean bathing every other day. Bathe in lukewarm water . Avoid using hot water . Bandage care If you were given a dressing, change it as told by your health care provider. Wash your hands with soap and water  for at least 20 seconds before and after you change  your dressing. If soap and water  are not available, use hand sanitizer. General instructions Avoid the substance that caused your reaction. If you do not know what caused it, keep a journal to try to track what caused it. Write down: What you eat and drink. What cosmetics you use. What you wear in the affected area. This includes jewelry. Contact a health care provider if: Your condition does not get better with treatment. Your condition gets worse. You have any signs of infection. You have a  fever. You have new symptoms. Your bone or joint under the affected area becomes painful after the skin has healed. Get help right away if: You notice red streaks coming from the affected area. The affected area turns darker. You have trouble breathing. This information is not intended to replace advice given to you by your health care provider. Make sure you discuss any questions you have with your health care provider. Document Revised: 03/31/2022 Document Reviewed: 03/31/2022 Elsevier Patient Education  2024 [employer].Poison Ivy Dermatitis Poison ivy dermatitis is irritation and swelling (inflammation) of the skin caused by chemicals in the leaves of the poison ivy plant. The skin reaction often involves redness, blisters, and extreme itching. What are the causes? This condition is caused by a chemical (urushiol) found in the sap of the poison ivy plant. This chemical is sticky and can easily spread to people, animals, and objects. You can get poison ivy dermatitis by: Having direct contact with a poison ivy plant. Touching animals, other people, or objects that have come in contact with poison ivy and have the chemical on them. What increases the risk? This condition is more likely to develop in people who: Are outdoors often in wooded or [place] areas. Go outdoors without wearing protective clothing, such as closed shoes, long pants, and a long-sleeved shirt. What are the signs or symptoms? Symptoms of this condition include: Redness of the skin. Extreme itching. A rash that often includes bumps and blisters. The rash usually appears 48 hours after exposure, if you have been exposed before. If this is the first time you have been exposed, the rash may not appear until a week after exposure. Swelling. This may occur if the reaction is more severe. Symptoms usually last for 1-2 weeks. However, the first time you develop this condition, symptoms may last 3-4 weeks. How is this  diagnosed? This condition may be diagnosed based on your symptoms and a physical exam. Your health care provider may also ask you about any recent outdoor activity. How is this treated? Treatment for this condition will vary depending on how severe it is. Treatment may include: Hydrocortisone cream or calamine lotion to relieve itching. Oatmeal baths to soothe the skin. Medicines, such as over-the-counter antihistamine tablets. Oral or injected steroid medicine, for more severe reactions. Follow these instructions at home: Medicines Take or apply over-the-counter and prescription medicines only as told by your health care provider. Use hydrocortisone cream or calamine lotion as needed to soothe the skin and relieve itching. General instructions Do not scratch or rub your skin. Apply a cold, wet cloth (cold compress) to the affected areas or take baths in cool water . This will help with itching. Avoid hot baths and showers. Take oatmeal baths as needed. Use colloidal oatmeal. You can get this at your local pharmacy or grocery store. Follow the instructions on the packaging. Wash all clothes, bedsheets, towels, and blankets you were in contact with between your exposure and appearance of the rash. Check the  affected area every day for signs of infection. Check for: More redness, swelling, or pain. Fluid or blood. Warmth. Pus or a bad smell. Keep all follow-up visits. Your health care provider may want to see how your skin is progressing with treatment. How is this prevented?  Learn to identify the poison ivy plant and avoid contact with the plant. This plant can be recognized by the number of leaves. Generally, poison ivy has three leaves with flowering branches on a single stem. The leaves are typically glossy, and they have jagged edges that come to a point. If you have been exposed to poison ivy, thoroughly wash with soap and water  right away. You have about 30 minutes to remove the plant  resin before it will cause the rash. Be sure to wash under your fingernails, because any plant resin there will continue to spread the rash. When hiking or camping, wear clothes that will help you to avoid skin exposure. This includes long pants, a long-sleeved shirt, long socks, and hiking boots. You can also apply preventive lotion to your skin to help limit exposure. If you suspect that your clothes or outdoor gear came in contact with poison ivy, rinse them off outside with a garden hose before you bring them inside your house. When doing yard work or gardening, wear gloves, long sleeves, long pants, and boots. Wash your garden tools and gloves if they come in contact with poison ivy. If you suspect that your pet has come into contact with poison ivy, wash them with pet shampoo and water . Make sure to wear gloves while washing your pet. Contact a health care provider if: You have open sores in the rash area. You have any signs of infection. You have redness that spreads beyond the rash area. You have a fever. You have a rash over a large area of your body. You have a rash on your eyes, mouth, or genitals. You have a rash that does not improve after a few weeks. Get help right away if: Your face swells or your eyes swell shut. You have trouble breathing. You have trouble swallowing. These symptoms may be an emergency. Get help right away. Call 911. Do not wait to see if the symptoms will go away. Do not drive yourself to the hospital. This information is not intended to replace advice given to you by your health care provider. Make sure you discuss any questions you have with your health care provider. Document Revised: 02/23/2022 Document Reviewed: 02/23/2022 Elsevier Patient Education  2024 [employer].

## 2024-03-11 NOTE — Progress Notes (Signed)
 Established Patient Office Visit  Subjective   Patient ID: Diana Lutz, female    DOB: [birth year]  Age: 58 y.o. MRN: [id number]  Chief Complaint  Patient presents with   Rash    Poison ivy from dogs in her bed again spreading    57y/o caucasian female rash started this weekend and worsening over the past 24 hours.  Dogs sleep with her in bed and she thinks from dogs washed them and her with dawn dish soap to try and get all oil off body.  Bilateral arms and chest red itchy rash.  Has washed and changed sheets.  Denied fever/n/v/d/discharge/wheezing/dyspnea/dysphagia/dysphasia.  Has not worked in yard or hiked with dogs.      Review of Systems  Constitutional:  Negative for chills, diaphoresis, fever and malaise/fatigue.  Respiratory:  Negative for cough, hemoptysis, sputum production, shortness of breath, wheezing and stridor.   Cardiovascular:  Negative for chest pain.  Gastrointestinal:  Negative for diarrhea, nausea and vomiting.  Musculoskeletal:  Negative for back pain and neck pain.  Skin:  Positive for itching and rash.  Neurological:  Negative for dizziness, speech change, weakness and headaches.  Psychiatric/Behavioral:  The patient does not have insomnia.       Objective:     BP 118/73 (BP Location: Left Arm, Patient Position: Sitting)   Pulse 69   Temp (!) 97.2 F (36.2 C) (Tympanic)   SpO2 96%    Physical Exam Vitals and nursing note reviewed.  Constitutional:      General: She is awake. She is not in acute distress.    Appearance: Normal appearance. She is well-developed, well-groomed and overweight. She is not ill-appearing, toxic-appearing or diaphoretic.  HENT:     Head: Normocephalic and atraumatic.     Jaw: There is normal jaw occlusion.     Salivary Glands: Right salivary gland is not diffusely enlarged or tender. Left salivary gland is not diffusely enlarged or tender.     Right Ear: Hearing and external ear normal. No decreased hearing  noted.     Left Ear: Hearing and external ear normal. No decreased hearing noted.     Nose: Nose normal. No congestion or rhinorrhea.     Right Nostril: No epistaxis.     Left Nostril: No epistaxis.     Right Sinus: No maxillary sinus tenderness or frontal sinus tenderness.     Left Sinus: No maxillary sinus tenderness or frontal sinus tenderness.     Mouth/Throat:     Lips: Pink. No lesions.     Mouth: Mucous membranes are moist. No oral lesions or angioedema.     Dentition: No gum lesions.     Tongue: No lesions. Tongue does not deviate from midline.     Palate: No mass and lesions.     Pharynx: Oropharynx is clear. Uvula midline. No oropharyngeal exudate or uvula swelling.     Tonsils: No tonsillar exudate.  Eyes:     General: Lids are normal. Vision grossly intact. Gaze aligned appropriately. Allergic shiner present. No scleral icterus.       Right eye: No discharge.        Left eye: No discharge.     Extraocular Movements: Extraocular movements intact.     Conjunctiva/sclera: Conjunctivae normal.     Pupils: Pupils are equal, round, and reactive to light.  Neck:     Trachea: Trachea and phonation normal.  Cardiovascular:     Rate and Rhythm: Normal rate and regular rhythm.  Pulses: Normal pulses.          Radial pulses are 2+ on the right side and 2+ on the left side.  Pulmonary:     Effort: Pulmonary effort is normal. No respiratory distress.     Breath sounds: Normal breath sounds and air entry. No stridor or transmitted upper airway sounds. No wheezing, rhonchi or rales.     Comments: Spoke full sentences without difficulty; no cough observed in exam room Abdominal:     General: Abdomen is flat.     Palpations: Abdomen is soft.  Musculoskeletal:        General: Tenderness present. No swelling. Normal range of motion.     Right hand: Normal strength. Normal capillary refill.     Left hand: Normal strength. Normal capillary refill.     Cervical back: Normal range of  motion and neck supple. No swelling, edema, deformity, erythema, signs of trauma, lacerations, rigidity, spasms, torticollis, tenderness or crepitus. No pain with movement. Normal range of motion.     Thoracic back: No swelling, edema, deformity, signs of trauma, lacerations, spasms or tenderness. Normal range of motion.     Right lower leg: No edema.     Left lower leg: No edema.  Lymphadenopathy:     Head:     Right side of head: No submental, submandibular, tonsillar, preauricular, posterior auricular or occipital adenopathy.     Left side of head: No submental, submandibular, tonsillar, preauricular, posterior auricular or occipital adenopathy.     Cervical: No cervical adenopathy.     Right cervical: No superficial, deep or posterior cervical adenopathy.    Left cervical: No superficial, deep or posterior cervical adenopathy.  Skin:    General: Skin is warm and dry.     Capillary Refill: Capillary refill takes less than 2 seconds.     Coloration: Skin is not ashen, cyanotic, jaundiced, mottled, pale or sallow.     Findings: Abrasion, erythema and rash present. No abscess, acne, bruising, burn, ecchymosis, signs of injury, laceration, lesion, petechiae or wound. Rash is macular and papular. Rash is not crusting, nodular, purpuric, pustular, scaling or vesicular.     Nails: There is no clubbing.          Comments: Macular papular erythematous dry rash anterior chest and bilateral arms/right hip; abrasions linear noted bilateral anterior forearms and right anterior hip  Neurological:     General: No focal deficit present.     Mental Status: She is alert and oriented to person, place, and time. Mental status is at baseline.     GCS: GCS eye subscore is 4. GCS verbal subscore is 5. GCS motor subscore is 6.     Cranial Nerves: Cranial nerves 2-12 are intact. No cranial nerve deficit, dysarthria or facial asymmetry.     Motor: Motor function is intact. No weakness, tremor, atrophy, abnormal  muscle tone or seizure activity.     Coordination: Coordination is intact. Coordination normal.     Gait: Gait is intact. Gait normal.     Comments: In/out of chair without difficulty; gait sure and steady in clinic; bilateral hand grasp equal 5/5  Psychiatric:        Attention and Perception: Attention and perception normal.        Mood and Affect: Mood and affect normal.        Speech: Speech normal.        Behavior: Behavior normal. Behavior is cooperative.        Thought  Content: Thought content normal.        Cognition and Memory: Cognition and memory normal.        Judgment: Judgment normal.      No results found for any visits on 03/11/24.    The 10-year ASCVD risk score (Arnett DK, et al., 2019) is: 5.9%    Assessment & Plan:   Problem List Items Addressed This Visit   None Visit Diagnoses       Plant irritant contact dermatitis    -  Primary     P-Consider applying fanafel or tecnu wash and cream prior to working in hard or hiking in woods.  Consider washing and changing bed linens daily until rash resolved and not letting dogs sleep with her in bed.  Start prednisone  10mg  taper discussed 2-3 week course using lower dose 40mg  x 2 days 30mg x2 days 20mg  x 3 days #21 RF0 dispensed from [facility] to patient and re-evaluation on Tuesday 18 Mar 2024. May continue OTC antihistamine and topical calagel BID prn prn itching, hydrocortisone 1% topical BID and aquaphor ointment topical prn dry/irritated skin.  Given 2 UD from clinic stock  Discussed if clothes rubbing rash apply telfa gauze with aquaphor topical first under telfa. OTC  zyrtec 10mg  po BID prn itching.  calagel thin smear BID prn itching  do not get in eyes; if worsening with calagel use stop and trial hydrocortisone 1% topical BID small smear affected area.  Wash hands before and after application.  Avoid hot steam showers.  Apply emollient twice a day e.g. Fragrance free vaseline/aquaphor.  May apply ice/cold compress 5  minutes QID prn itching/swelling.  Avoid scratching as can lead to secondary infection no discharge or red streaks noted today.  Avoid steam showers.  Shower when she finishes work/prior to bedtime.  Avoid harsh/abrasive soaps use fragrance free/sensitive like cetaphil.    Medication as directed. Call or return to clinic as needed if these symptoms worsen or fail to improve as anticipated. Exitcare handouts on contact dermatitis and pruritis  Follow up for re-evaluation sooner if in 48 hours no improvement and/or worsening of rash with plan of care. Patient verbalized agreement and understanding of treatment plan and had no further questions at this time   Return in about 1 week (around 03/18/2024), or if symptoms worsen or fail to improve, for rash re-evaluation.    Richardine Chancy, NP

## 2024-03-11 NOTE — Telephone Encounter (Signed)
 Patient signed [employer] Well 2026 ROI and tobacco attestation 12/27/2023 NP signed provider portion 12/27/2023 and [facility] form given to HR Tonya met requirements for insurance discount starting 09 Jul 2024

## 2024-03-11 NOTE — Progress Notes (Signed)
 Poison [place] on right side of Abdomen and both forearms

## 2024-03-17 DIAGNOSIS — E1165 Type 2 diabetes mellitus with hyperglycemia: Secondary | ICD-10-CM

## 2024-03-18 ENCOUNTER — Encounter: Payer: Self-pay | Admitting: Registered Nurse

## 2024-03-18 ENCOUNTER — Ambulatory Visit: Admitting: Registered Nurse

## 2024-03-18 VITALS — BP 128/87 | HR 82

## 2024-03-18 DIAGNOSIS — L247 Irritant contact dermatitis due to plants, except food: Secondary | ICD-10-CM

## 2024-03-18 DIAGNOSIS — Z0000 Encounter for general adult medical examination without abnormal findings: Secondary | ICD-10-CM

## 2024-03-18 MED ORDER — PREDNISONE 10 MG PO TABS: ORAL_TABLET | ORAL | Status: AC

## 2024-03-18 MED ORDER — TIRZEPATIDE 7.5 MG/0.5ML ~~LOC~~ SOAJ
7.5000 mg | SUBCUTANEOUS | 0 refills | Status: DC
  Filled 2024-03-19: qty 2, 28d supply, fill #0

## 2024-03-18 NOTE — Progress Notes (Signed)
 F/U from rash

## 2024-03-18 NOTE — Patient Instructions (Signed)
 Pruritus Pruritus is an itchy feeling on the skin. One of the most common causes is dry skin, but many different things can cause itching. Most cases of itching do not require medical attention. Sometimes itchy skin can turn into a rash or a secondary infection. Follow these instructions at home: Skin care  Do not use scented soaps, detergents, perfumes, and cosmetic products. Instead, use gentle, unscented versions of these items. Apply moisturizing creams to your skin frequently, at least twice daily. Apply immediately after bathing while skin is still wet. Take medicines or apply medicated creams only as told by your health care provider. This may include: Corticosteroid cream or topical calcineurin inhibitor. Anti-itch lotions containing urea, camphor, or menthol . Oral antihistamines. Do not take hot showers or baths, which can make itching worse. A short, cool shower may help with itching as long as you apply moisturizing lotion after the shower. Apply a cool, wet cloth (cool compress) to the affected areas. You may take lukewarm baths with one of the following: Epsom salts. You can get these at your local pharmacy or grocery store. Follow the instructions on the packaging. Baking soda. Pour a small amount into the bath as told by your health care provider. Colloidal oatmeal. You can get this at your local pharmacy or grocery store. Follow the instructions on the packaging. Do not scratch your skin. General instructions Avoid wearing tight clothes. Keep a journal to help find out what is causing your itching. Write down: What you eat and drink. What cosmetic products you use. What soaps or detergents you use. What you wear, including jewelry. Use a humidifier. This keeps the air moist, which helps to prevent dry skin. Be aware of any changes in your itchiness. Tell your health care provider about any changes. Contact a health care provider if: The itching does not go away after  several days. You notice redness, warmth, or drainage on the skin where you have scratched. You are unusually thirsty or urinating more than normal. Your skin tingles or feels numb. Your skin or the white parts of your eyes turn yellow (jaundice). You feel weak. You have any of the following: Night sweats. Tiredness (fatigue). Weight loss. Abdominal pain. Summary Pruritus is an itchy feeling on the skin. One of the most common causes is dry skin, but many different conditions and factors can cause itching. Apply moisturizing creams to your skin frequently, at least twice daily. Apply immediately after bathing while skin is still wet. Take medicines or apply medicated creams only as told by your health care provider. Do not take hot showers or baths. Do not use scented soaps, detergents, perfumes, or cosmetic products. Keep a journal to help find out what is causing your itching. This information is not intended to replace advice given to you by your health care provider. Make sure you discuss any questions you have with your health care provider. Document Revised: 11/02/2021 Document Reviewed: 11/02/2021 Elsevier Patient Education  2024 [employer]Poison Ivy Dermatitis Poison ivy dermatitis is irritation and swelling (inflammation) of the skin caused by chemicals in the leaves of the poison ivy plant. The skin reaction often involves redness, blisters, and extreme itching. What are the causes? This condition is caused by a chemical (urushiol) found in the sap of the poison ivy plant. This chemical is sticky and can easily spread to people, animals, and objects. You can get poison ivy dermatitis by: Having direct contact with a poison ivy plant. Touching animals, other people, or objects that  have come in contact with poison ivy and have the chemical on them. What increases the risk? This condition is more likely to develop in people who: Are outdoors often in wooded or [place] areas. Go  outdoors without wearing protective clothing, such as closed shoes, long pants, and a long-sleeved shirt. What are the signs or symptoms? Symptoms of this condition include: Redness of the skin. Extreme itching. A rash that often includes bumps and blisters. The rash usually appears 48 hours after exposure, if you have been exposed before. If this is the first time you have been exposed, the rash may not appear until a week after exposure. Swelling. This may occur if the reaction is more severe. Symptoms usually last for 1-2 weeks. However, the first time you develop this condition, symptoms may last 3-4 weeks. How is this diagnosed? This condition may be diagnosed based on your symptoms and a physical exam. Your health care provider may also ask you about any recent outdoor activity. How is this treated? Treatment for this condition will vary depending on how severe it is. Treatment may include: Hydrocortisone cream or calamine lotion to relieve itching. Oatmeal baths to soothe the skin. Medicines, such as over-the-counter antihistamine tablets. Oral or injected steroid medicine, for more severe reactions. Follow these instructions at home: Medicines Take or apply over-the-counter and prescription medicines only as told by your health care provider. Use hydrocortisone cream or calamine lotion as needed to soothe the skin and relieve itching. General instructions Do not scratch or rub your skin. Apply a cold, wet cloth (cold compress) to the affected areas or take baths in cool water . This will help with itching. Avoid hot baths and showers. Take oatmeal baths as needed. Use colloidal oatmeal. You can get this at your local pharmacy or grocery store. Follow the instructions on the packaging. Wash all clothes, bedsheets, towels, and blankets you were in contact with between your exposure and appearance of the rash. Check the affected area every day for signs of infection. Check for: More  redness, swelling, or pain. Fluid or blood. Warmth. Pus or a bad smell. Keep all follow-up visits. Your health care provider may want to see how your skin is progressing with treatment. How is this prevented?  Learn to identify the poison ivy plant and avoid contact with the plant. This plant can be recognized by the number of leaves. Generally, poison ivy has three leaves with flowering branches on a single stem. The leaves are typically glossy, and they have jagged edges that come to a point. If you have been exposed to poison ivy, thoroughly wash with soap and water  right away. You have about 30 minutes to remove the plant resin before it will cause the rash. Be sure to wash under your fingernails, because any plant resin there will continue to spread the rash. When hiking or camping, wear clothes that will help you to avoid skin exposure. This includes long pants, a long-sleeved shirt, long socks, and hiking boots. You can also apply preventive lotion to your skin to help limit exposure. If you suspect that your clothes or outdoor gear came in contact with poison ivy, rinse them off outside with a garden hose before you bring them inside your house. When doing yard work or gardening, wear gloves, long sleeves, long pants, and boots. Wash your garden tools and gloves if they come in contact with poison ivy. If you suspect that your pet has come into contact with poison ivy, wash them with  pet shampoo and water . Make sure to wear gloves while washing your pet. Contact a health care provider if: You have open sores in the rash area. You have any signs of infection. You have redness that spreads beyond the rash area. You have a fever. You have a rash over a large area of your body. You have a rash on your eyes, mouth, or genitals. You have a rash that does not improve after a few weeks. Get help right away if: Your face swells or your eyes swell shut. You have trouble breathing. You have  trouble swallowing. These symptoms may be an emergency. Get help right away. Call 911. Do not wait to see if the symptoms will go away. Do not drive yourself to the hospital. This information is not intended to replace advice given to you by your health care provider. Make sure you discuss any questions you have with your health care provider. Document Revised: 02/23/2022 Document Reviewed: 02/23/2022 Elsevier Patient Education  2024 [employer].

## 2024-03-18 NOTE — Progress Notes (Addendum)
 Established Patient Office Visit  Subjective   Patient ID: Diana Lutz, female    DOB: October 13, 1965  Age: 58 y.o. MRN: [id number]  Chief Complaint  Patient presents with   Rash    Abdomen still resolving slightly red scaley    57y/o caucasian female rash last seen 03/11/24 and started on prednisone  taper for poison ivy dermatitis.  Arms/neck/chest resolved but still has patch right chest/hip faint redness and itching.  Dog still sleeping in bed with her  Denied fever/chills/spreading/discharge  Has washed and changed sheets.  Denied fever/n/v/d/discharge/wheezing/dyspnea/dysphagia/dysphasia.  Has not worked in yard or hiked with dogs.      Review of Systems  Constitutional:  Negative for chills and fever.  HENT:  Negative for sore throat.   Respiratory:  Negative for cough, shortness of breath, wheezing and stridor.   Cardiovascular:  Negative for chest pain.  Gastrointestinal:  Negative for diarrhea and vomiting.  Neurological:  Negative for dizziness and headaches.      Objective:     BP 128/87 (BP Location: Left Arm, Patient Position: Sitting)   Pulse 82   SpO2 97%    Physical Exam Vitals and nursing note reviewed.  Constitutional:      General: She is awake. She is not in acute distress.    Appearance: Normal appearance. She is well-developed and well-groomed. She is not ill-appearing, toxic-appearing or diaphoretic.  HENT:     Head: Normocephalic and atraumatic.     Jaw: There is normal jaw occlusion.     Salivary Glands: Right salivary gland is not diffusely enlarged. Left salivary gland is not diffusely enlarged.     Right Ear: Hearing and external ear normal. No decreased hearing noted.     Left Ear: Hearing and external ear normal. No decreased hearing noted.     Nose: Nose normal. No congestion or rhinorrhea.     Mouth/Throat:     Lips: Pink. No lesions.     Mouth: Mucous membranes are moist. No oral lesions or angioedema.     Dentition: No gum  lesions.     Tongue: No lesions. Tongue does not deviate from midline.     Palate: No mass and lesions.     Pharynx: Oropharynx is clear. Uvula midline. No pharyngeal swelling, posterior oropharyngeal erythema or postnasal drip.     Tonsils: No tonsillar exudate.  Eyes:     General: Lids are normal. Vision grossly intact. Gaze aligned appropriately. Allergic shiner present. No scleral icterus.       Right eye: No discharge.        Left eye: No discharge.     Extraocular Movements: Extraocular movements intact.     Conjunctiva/sclera: Conjunctivae normal.     Pupils: Pupils are equal, round, and reactive to light.  Neck:     Trachea: Trachea normal.  Cardiovascular:     Rate and Rhythm: Normal rate and regular rhythm.     Pulses:          Radial pulses are 2+ on the right side and 2+ on the left side.  Pulmonary:     Effort: Pulmonary effort is normal. No respiratory distress.     Breath sounds: Normal breath sounds and air entry. No stridor or transmitted upper airway sounds. No wheezing, rhonchi or rales.     Comments: Spoke full sentences without difficulty; no cough observed in exam room Abdominal:     General: Abdomen is flat.  Musculoskeletal:  General: No swelling, tenderness, deformity or signs of injury. Normal range of motion.     Right hand: Normal strength. Normal capillary refill.     Left hand: Normal strength. Normal capillary refill.     Cervical back: Normal range of motion and neck supple. No swelling, edema, deformity, erythema, signs of trauma, lacerations, rigidity, spasms, torticollis, tenderness or crepitus. No pain with movement. Normal range of motion.     Thoracic back: No swelling, edema, deformity, signs of trauma, lacerations, spasms or tenderness. Normal range of motion.     Right lower leg: No edema.     Left lower leg: No edema.  Lymphadenopathy:     Head:     Right side of head: No submandibular or preauricular adenopathy.     Left side of  head: No submandibular or preauricular adenopathy.     Cervical: No cervical adenopathy.     Right cervical: No superficial cervical adenopathy.    Left cervical: No superficial cervical adenopathy.  Skin:    General: Skin is warm and dry.     Capillary Refill: Capillary refill takes less than 2 seconds.     Coloration: Skin is not ashen, cyanotic, jaundiced, mottled, pale or sallow.     Findings: Erythema and rash present. No abrasion, abscess, acne, bruising, burn, ecchymosis, signs of injury, laceration, lesion, petechiae or wound. Rash is macular, papular and scaling. Rash is not crusting, nodular, purpuric, pustular, urticarial or vesicular.     Nails: There is no clubbing.          Comments: Fine papules grouped dry fine scaling and macular erythema right hip/lateral abdomen/thorax  Neurological:     General: No focal deficit present.     Mental Status: She is alert and oriented to person, place, and time. Mental status is at baseline.     GCS: GCS eye subscore is 4. GCS verbal subscore is 5. GCS motor subscore is 6.     Cranial Nerves: No cranial nerve deficit, dysarthria or facial asymmetry.     Motor: Motor function is intact. No weakness, tremor, atrophy, abnormal muscle tone or seizure activity.     Coordination: Coordination is intact. Coordination normal.     Gait: Gait is intact. Gait normal.     Comments: In/out of chair without difficulty; gait sure and steady in clinic; bilateral hand grasp equal 5/5  Psychiatric:        Attention and Perception: Attention and perception normal.        Mood and Affect: Mood and affect normal.        Speech: Speech normal.        Behavior: Behavior normal. Behavior is cooperative.        Thought Content: Thought content normal.        Cognition and Memory: Cognition and memory normal.        Judgment: Judgment normal.      No results found for any visits on 03/18/24.    The 10-year ASCVD risk score (Arnett DK, et al., 2019) is:  6.9%    Assessment & Plan:   Problem List Items Addressed This Visit       Other   Preventative health care   Other Visit Diagnoses       Plant irritant contact dermatitis    -  Primary      Consider washing and changing bed linens daily until rash resolved and not letting dogs sleep with her in bed.  Start prednisone  10mg   taper discussed 2-3 week course using lower dose continued 10mg  x 2 days 5mg  x 4 days #21 RF0 dispensed from [facility] to patient and re-evaluation if not completedly resolved  Tuesday 25 Mar 2024. May continue OTC antihistamine and topical calagel BID prn prn itching, hydrocortisone 1% topical BID and aquaphor ointment topical prn dry/irritated skin.  OTC  zyrtec 10mg  po BID prn itching.  calagel thin smear BID prn itching  do not get in eyes; if worsening with calagel use stop and trial hydrocortisone 1% topical BID small smear affected area.  Wash hands before and after application.  Avoid hot steam showers.  Apply emollient twice a day e.g. Fragrance free vaseline/aquaphor.  May apply ice/cold compress 5 minutes QID prn itching/swelling.  Avoid scratching as can lead to secondary infection no discharge or red streaks noted today.  Avoid steam showers.  Shower when she finishes work/prior to bedtime.  Avoid harsh/abrasive soaps use fragrance free/sensitive like cetaphil.    Medication as directed. Call or return to clinic as needed if these symptoms worsen or fail to improve as anticipated. Exitcare handouts on contact dermatitis and pruritis  Follow up for re-evaluation sooner if in 48 hours no improvement and/or worsening of rash with plan of care. Patient verbalized agreement and understanding of treatment plan and had no further questions at this time    Return in about 1 week (around 03/25/2024),  if symptoms worsen or fail to improve, for rash re-evaluation.  No need for follow up appt if rash resolved   HR Jen given [facility] paperwork for insurance discount met requirements  today 03/18/2024 and NP signed [employer] Well 2026 provider portion paperwork   BP 121/79 LDL 104 Hgba1c 8.6 weight [id number] BMI 35.29 and previous year results 108/85 LDL 92 Hgba1c 7 weight 239lbs BMI 37.43  nonsmoker/vaper in previous 12 months patient signed [employer] Well 2026 ROI and tobacco attestation with RN Thersia Flax.  Return if symptoms worsen or fail to improve.    Richardine Chancy, NP

## 2024-03-19 ENCOUNTER — Other Ambulatory Visit (HOSPITAL_BASED_OUTPATIENT_CLINIC_OR_DEPARTMENT_OTHER): Payer: Self-pay

## 2024-04-01 ENCOUNTER — Ambulatory Visit: Payer: Self-pay | Admitting: Registered Nurse

## 2024-04-01 DIAGNOSIS — E1165 Type 2 diabetes mellitus with hyperglycemia: Secondary | ICD-10-CM

## 2024-04-14 DIAGNOSIS — E1165 Type 2 diabetes mellitus with hyperglycemia: Secondary | ICD-10-CM

## 2024-04-15 MED ORDER — TIRZEPATIDE 7.5 MG/0.5ML ~~LOC~~ SOAJ
7.5000 mg | SUBCUTANEOUS | 0 refills | Status: DC
  Filled 2024-04-16: qty 2, 28d supply, fill #0

## 2024-04-16 ENCOUNTER — Other Ambulatory Visit (HOSPITAL_BASED_OUTPATIENT_CLINIC_OR_DEPARTMENT_OTHER): Payer: Self-pay

## 2024-04-23 ENCOUNTER — Ambulatory Visit (INDEPENDENT_AMBULATORY_CARE_PROVIDER_SITE_OTHER): Admitting: Primary Care

## 2024-04-23 ENCOUNTER — Encounter: Payer: Self-pay | Admitting: Primary Care

## 2024-04-23 VITALS — BP 110/66 | HR 68 | Temp 97.2°F | Ht 67.0 in | Wt 197.0 lb

## 2024-04-23 DIAGNOSIS — E1165 Type 2 diabetes mellitus with hyperglycemia: Secondary | ICD-10-CM

## 2024-04-23 DIAGNOSIS — Z7984 Long term (current) use of oral hypoglycemic drugs: Secondary | ICD-10-CM

## 2024-04-23 LAB — POCT GLYCOSYLATED HEMOGLOBIN (HGB A1C): Hemoglobin A1C: 5 % (ref 4.0–5.6)

## 2024-04-23 MED ORDER — METFORMIN HCL ER 500 MG PO TB24: 500.0000 mg | ORAL_TABLET | Freq: Every day | ORAL | Status: DC

## 2024-04-23 NOTE — Patient Instructions (Signed)
 Reduce your metformin  to 1 pill daily for diabetes.  Continue taking Mounjaro  7.5 mg weekly for diabetes.  Please schedule a physical to meet with me in 6  months.   It was a pleasure to see you today!

## 2024-04-23 NOTE — Assessment & Plan Note (Signed)
 Improved and at goal with A1C of 5.0!  Commended her on dietary changes.  Continue Mounjaro  7.5 mg weekly. Reduce Metformin  ER to 500 mg daily.  Foot exam today.  Follow up in 6 months

## 2024-04-23 NOTE — Progress Notes (Signed)
 Subjective:    Patient ID: Diana Lutz, female    DOB: 1966/01/01, 58 y.o.   MRN: [id number]  HPI  Diana Lutz is a very pleasant 58 y.o. female with a history of hypertension, OSA, NAFLD, type 2 diabetes, hyperlipidemia who presents today for follow-up of diabetes.  Current medications include: Metformin  ER 1000 mg daily, Mounjaro  7.5 mg weekly  She is checking her blood glucose 2 times weekly and is getting readings of low to mid 100s   Last A1C: 6.0 in April 2025, 5.0 today Last Eye Exam: Up-to-date Last Foot Exam: Due Pneumonia Vaccination: 2021 Urine Microalbumin: Up-to-date Statin: Rosuvastatin   Dietary changes since last visit: Increased intake of protein and veggies    Exercise: Walking sometimes   Wt Readings from Last 3 Encounters:  04/23/24 197 lb (89.4 kg)  01/23/24 217 lb (98.4 kg)  10/24/23 235 lb (106.6 kg)   BP Readings from Last 3 Encounters:  04/23/24 110/66  03/18/24 128/87  03/11/24 118/73         Review of Systems  Eyes:  Negative for visual disturbance.  Respiratory:  Negative for shortness of breath.   Cardiovascular:  Negative for chest pain.  Gastrointestinal:  Negative for constipation and nausea.  Neurological:  Negative for dizziness and numbness.         Past Medical History:  Diagnosis Date   Abnormal Pap smear of cervix 1991   Acute right-sided thoracic back pain 05/17/2021   Anemia    history of anemia   Diabetes mellitus without complication (HCC)    Dysplasia of cervix    cervical ca   GERD (gastroesophageal reflux disease)    Hx of adenomatous polyp of colon 05/30/2020   Hypertension    Migraines    NAFLD (nonalcoholic fatty liver disease) 96/78/7976   Right upper quadrant abdominal pain 06/15/2021    Social History   Socioeconomic History   Marital status: Married    Spouse name: Not on file   Number of children: Not on file   Years of education: Not on file   Highest education level:  Associate degree: occupational, Scientist, product/process development, or vocational program  Occupational History   Not on file  Tobacco Use   Smoking status: Never   Smokeless tobacco: Never  Vaping Use   Vaping status: Never Used  Substance and Sexual Activity   Alcohol use: Not Currently    Alcohol/week: 0.0 standard drinks of alcohol    Comment: social   Drug use: No   Sexual activity: Not Currently  Other Topics Concern   Not on file  Social History Narrative   Married.   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   Enjoys four wheeling, working on the house   Social Drivers of Health   Financial Resource Strain: Low Risk  (04/22/2024)   Overall Financial Resource Strain (CARDIA)    Difficulty of Paying Living Expenses: Not very hard  Food Insecurity: No Food Insecurity (04/22/2024)   Hunger Vital Sign    Worried About Running Out of Food in the Last Year: Never true    Ran Out of Food in the Last Year: Never true  Transportation Needs: No Transportation Needs (04/22/2024)   PRAPARE - Administrator, Civil Service (Medical): No    Lack of Transportation (Non-Medical): No  Physical Activity: Insufficiently Active (04/22/2024)   Exercise Vital Sign    Days of Exercise per Week: 3 days    Minutes of  Exercise per Session: 20 min  Stress: No Stress Concern Present (04/22/2024)   [employer] of Occupational Health - Occupational Stress Questionnaire    Feeling of Stress: Not at all  Social Connections: Socially Integrated (04/22/2024)   Social Connection and Isolation Panel    Frequency of Communication with Friends and Family: More than three times a week    Frequency of Social Gatherings with Friends and Family: More than three times a week    Attends Religious Services: More than 4 times per year    Active Member of [employer] or Organizations: Yes    Attends Banker Meetings: More than 4 times per year    Marital Status: Married  Catering manager Violence:  Unknown (01/11/2022)   Received from [facility]   HITS    Physically Hurt: Not on file    Insult or Talk Down To: Not on file    Threaten Physical Harm: Not on file    Scream or Curse: Not on file    Past Surgical History:  Procedure Laterality Date   ANTERIOR AND POSTERIOR REPAIR N/A 08/18/2015   Procedure: ANTERIOR (CYSTOCELE) AND POSTERIOR REPAIR (RECTOCELE)/Perineorrhaphy;  Surgeon: Robbi Render, MD;  Location: [facility];  Service: Gynecology;  Laterality: N/A;  2 hrs.   BLADDER SUSPENSION N/A 08/18/2015   Procedure: TRANSVAGINAL TAPE (TVT) Mid-Urethral Sling PROCEDURE;  Surgeon: Robbi Render, MD;  Location: [facility];  Service: Gynecology;  Laterality: N/A;   COLONOSCOPY  11/15/2005   normal-Dr.Gessner   CYSTOSCOPY N/A 08/18/2015   Procedure: CYSTOSCOPY;  Surgeon: Robbi Render, MD;  Location: [facility];  Service: Gynecology;  Laterality: N/A;   HYSTEROSCOPY W/ ENDOMETRIAL ABLATION     LASER ABLATION OF THE CERVIX     WISDOM TOOTH EXTRACTION      Family History  Problem Relation Age of Onset   Hypertension Mother    Diverticulosis Mother    Colon polyps Mother    Hypertension Father    Stroke Father    Heart attack Father    Prostate cancer Father    Bladder Cancer Father    Hypertension Brother    Diabetes Brother    Colon cancer Maternal Grandmother 74   Esophageal cancer Neg Hx    Rectal cancer Neg Hx    Stomach cancer Neg Hx    Breast cancer Neg Hx     No Known Allergies  Current Outpatient Medications on File Prior to Visit  Medication Sig Dispense Refill   albuterol  (VENTOLIN  HFA) 108 (90 Base) MCG/ACT inhaler Inhale 1-2 puffs into the lungs every 4 (four) hours as needed for wheezing or shortness of breath. 1 each 0   amLODipine  (NORVASC ) 10 MG tablet Take 1 tablet (10 mg total) by mouth daily. 90 tablet 1   calcium  carbonate (TUMS - DOSED IN MG ELEMENTAL CALCIUM ) 500 MG chewable tablet Chew 2 tablets by mouth daily as needed for indigestion or heartburn.      Cholecalciferol  (D3 5000) 125 MCG (5000 UT) capsule Take 1 capsule (5,000 Units total) by mouth daily. Take with meal 200 capsule 3   [employer] LUBRICANT EYE DROPS 0.4-0.3 % SOLN APPLY 1 DROP TO EYE 3 (THREE) TIMES DAILY.  12   fluticasone  (FLONASE ) 50 MCG/ACT nasal spray Place 1 spray into both nostrils 2 (two) times daily. 48 mL 2   ibuprofen (ADVIL,MOTRIN) 600 MG tablet Take 600 mg by mouth daily as needed.     ipratropium (ATROVENT ) 0.03 % nasal spray Place 2 sprays into the  nose 3 (three) times daily. 30 mL 0   losartan  (COZAAR ) 100 MG tablet Take 1 tablet (100 mg total) by mouth daily for blood pressure. 90 tablet 3   omeprazole  (PRILOSEC) 20 MG capsule Take 1 capsule (20 mg total) by mouth daily. 90 capsule 3   rosuvastatin  (CRESTOR ) 5 MG tablet Take 1 tablet (5 mg total) by mouth daily.     sodium chloride  (OCEAN) 0.65 % SOLN nasal spray Place 2 sprays into both nostrils every 2 (two) hours while awake.     tirzepatide  (MOUNJARO ) 7.5 MG/0.5ML Pen Inject 7.5 mg into the skin once a week. for diabetes. 2 mL 0   No current facility-administered medications on file prior to visit.    BP 110/66   Pulse 68   Temp (!) 97.2 F (36.2 C) (Temporal)   Ht 5' 7 (1.702 m)   Wt 197 lb (89.4 kg)   SpO2 98%   BMI 30.85 kg/m  Objective:   Physical Exam Cardiovascular:     Rate and Rhythm: Normal rate and regular rhythm.  Pulmonary:     Effort: Pulmonary effort is normal.     Breath sounds: Normal breath sounds.  Musculoskeletal:     Cervical back: Neck supple.  Skin:    General: Skin is warm and dry.  Neurological:     Mental Status: She is alert and oriented to person, place, and time.  Psychiatric:        Mood and Affect: Mood normal.           Assessment & Plan:  Type 2 diabetes mellitus with hyperglycemia, without long-term current use of insulin (HCC) Assessment & Plan: Improved and at goal with A1C of 5.0!  Commended her on dietary changes.  Continue Mounjaro  7.5 mg  weekly. Reduce Metformin  ER to 500 mg daily.  Foot exam today.  Follow up in 6 months   Orders: -     POCT glycosylated hemoglobin (Hb A1C) -     metFORMIN  HCl ER; Take 1 tablet (500 mg total) by mouth daily with breakfast. for diabetes.        Coraline Talwar K Previn Jian, NP

## 2024-04-24 ENCOUNTER — Ambulatory Visit: Payer: Self-pay | Admitting: Primary Care

## 2024-05-15 DIAGNOSIS — E1165 Type 2 diabetes mellitus with hyperglycemia: Secondary | ICD-10-CM

## 2024-05-16 ENCOUNTER — Other Ambulatory Visit (HOSPITAL_BASED_OUTPATIENT_CLINIC_OR_DEPARTMENT_OTHER): Payer: Self-pay

## 2024-05-16 MED ORDER — TIRZEPATIDE 7.5 MG/0.5ML ~~LOC~~ SOAJ
7.5000 mg | SUBCUTANEOUS | 1 refills | Status: DC
  Filled 2024-05-16: qty 6, 84d supply, fill #0

## 2024-05-17 ENCOUNTER — Other Ambulatory Visit (HOSPITAL_BASED_OUTPATIENT_CLINIC_OR_DEPARTMENT_OTHER): Payer: Self-pay

## 2024-06-17 ENCOUNTER — Encounter: Payer: Self-pay | Admitting: Registered Nurse

## 2024-06-17 ENCOUNTER — Telehealth: Payer: Self-pay | Admitting: Registered Nurse

## 2024-06-17 DIAGNOSIS — K219 Gastro-esophageal reflux disease without esophagitis: Secondary | ICD-10-CM

## 2024-06-17 NOTE — Telephone Encounter (Signed)
 Patient last filled 03/06/24 90 capsules omeprazole  Dr 20mg  po daily  last labs 10/19/23 working well for patient dispensed 90 capsules to patient today from [facility]   Latest Reference Range & Units 10/19/23 08:40   Sodium 134 - 144 mmol/L 139  Potassium 3.5 - 5.2 mmol/L 4.5  Chloride 96 - 106 mmol/L 101  Glucose 70 - 99 mg/dL 758 (H)  BUN 6 - 24 mg/dL 13  Creatinine 9.42 - 8.99 mg/dL 9.17  Calcium  8.7 - 10.2 mg/dL 9.3  BUN/Creatinine Ratio 9 - 23  16  eGFR >59 mL/min/1.73 82  Phosphorus 3.0 - 4.3 mg/dL 3.2  Alkaline Phosphatase 44 - 121 IU/L 145 (H)  Albumin 3.8 - 4.9 g/dL 4.5  Uric Acid 3.0 - 7.2 mg/dL 2.9 (L)  AST 0 - 40 IU/L 20  ALT 0 - 32 IU/L 37 (H)  Total Protein 6.0 - 8.5 g/dL 6.8  Total Bilirubin 0.0 - 1.2 mg/dL 0.7  GGT 0 - 60 IU/L 46  Estimated CHD Risk 0.0 - 1.0 times avg.  < 0.5  LDH 119 - 226 IU/L 171  Total CHOL/HDL Ratio 0.0 - 4.4 ratio 3.2  Cholesterol, Total 100 - 199 mg/dL 818  HDL Cholesterol >60 mg/dL 56  Triglycerides 0 - 850 mg/dL 882  VLDL Cholesterol Cal 5 - 40 mg/dL 21  LDL Chol Calc (NIH) 0 - 99 mg/dL 895 (H)  Iron 27 - 840 ug/dL 895  Vitamin D , 25-Hydroxy 30.0 - 100.0 ng/mL 45.3  Globulin, Total 1.5 - 4.5 g/dL 2.3  WBC 3.4 - 89.1 [id number] 8.6  RBC 3.77 - 5.28 x10E6/uL 5.03  Hemoglobin 11.1 - 15.9 g/dL 84.6  HCT 65.9 - 53.3 % 46.1  MCV 79 - 97 fL 92  MCH 26.6 - 33.0 pg 30.4  MCHC 31.5 - 35.7 g/dL 66.7  RDW 88.2 - 84.5 % 13.2  Platelets 150 - 450 x10E3/uL 206  Hematology Comments:   Note:  Neutrophils Not Estab. % 58  (H): Data is abnormally high (L): Data is abnormally low

## 2024-07-02 ENCOUNTER — Other Ambulatory Visit (HOSPITAL_COMMUNITY): Payer: Self-pay

## 2024-08-06 DIAGNOSIS — E1165 Type 2 diabetes mellitus with hyperglycemia: Secondary | ICD-10-CM

## 2024-08-08 ENCOUNTER — Other Ambulatory Visit (HOSPITAL_BASED_OUTPATIENT_CLINIC_OR_DEPARTMENT_OTHER): Payer: Self-pay

## 2024-08-08 MED ORDER — MOUNJARO 10 MG/0.5ML ~~LOC~~ SOAJ
10.0000 mg | SUBCUTANEOUS | 0 refills | Status: DC
  Filled 2024-08-08: qty 6, 84d supply, fill #0

## 2024-08-14 ENCOUNTER — Other Ambulatory Visit: Payer: Self-pay | Admitting: Primary Care

## 2024-08-14 DIAGNOSIS — E1165 Type 2 diabetes mellitus with hyperglycemia: Secondary | ICD-10-CM

## 2024-08-20 ENCOUNTER — Other Ambulatory Visit (HOSPITAL_BASED_OUTPATIENT_CLINIC_OR_DEPARTMENT_OTHER): Payer: Self-pay

## 2024-08-26 ENCOUNTER — Encounter: Payer: Self-pay | Admitting: Registered Nurse

## 2024-08-26 ENCOUNTER — Telehealth: Payer: Self-pay | Admitting: Registered Nurse

## 2024-08-26 MED ORDER — AMLODIPINE BESYLATE 10 MG PO TABS: 10.0000 mg | ORAL_TABLET | Freq: Every day | ORAL | 1 refills | Status: AC

## 2024-08-26 MED ORDER — ROSUVASTATIN CALCIUM 5 MG PO TABS: 5.0000 mg | ORAL_TABLET | Freq: Every day | ORAL | 2 refills | Status: AC

## 2024-08-26 NOTE — Telephone Encounter (Signed)
 Patient last filled omeprazole  DR 20mg  po daily #90 9/925; amlodipine  10mg  po daily #90 03/06/24 and rosuvastatin  5mg  po daily 10/25/2023 #90 from [facility].  Patient reported with diet, exercise and mounjarno she has lost 70lbs/4 clothes sizes and feeling much better.  Has been able to stop lisinopril  and decrease metformin  to 500mg  po daily from [facility].  Last BP  110/66 PCM visit 04/23/24 weight 197lbs Last labs  Latest Reference Range & Units 10/19/23 08:40 01/23/24 12:06 01/23/24 12:36 04/23/24 12:03  Sodium 134 - 144 mmol/L 139     Potassium 3.5 - 5.2 mmol/L 4.5     Chloride 96 - 106 mmol/L 101     Glucose 70 - 99 mg/dL 758 (H)     BUN 6 - 24 mg/dL 13     Creatinine 9.42 - 1.00 mg/dL 9.17     Calcium  8.7 - 10.2 mg/dL 9.3     BUN/Creatinine Ratio 9 - 23  16     eGFR >59 mL/min/1.73 82     Phosphorus 3.0 - 4.3 mg/dL 3.2     Alkaline Phosphatase 44 - 121 IU/L 145 (H)     Albumin 3.8 - 4.9 g/dL 4.5     Uric Acid 3.0 - 7.2 mg/dL 2.9 (L)     AST 0 - 40 IU/L 20     ALT 0 - 32 IU/L 37 (H)     Total Protein 6.0 - 8.5 g/dL 6.8     Total Bilirubin 0.0 - 1.2 mg/dL 0.7     GGT 0 - 60 IU/L 46     Estimated CHD Risk 0.0 - 1.0 times avg.  < 0.5     LDH 119 - 226 IU/L 171     Total CHOL/HDL Ratio 0.0 - 4.4 ratio 3.2     Cholesterol, Total 100 - 199 mg/dL 818     HDL Cholesterol >39 mg/dL 56     MICROALB/CREAT RATIO 0.0 - 30.0 mg/g   5.4   Triglycerides 0 - 149 mg/dL 882     VLDL Cholesterol Cal 5 - 40 mg/dL 21     LDL Chol Calc (NIH) 0 - 99 mg/dL 895 (H)     Iron 27 - 840 ug/dL 895     Vitamin D , 25-Hydroxy 30.0 - 100.0 ng/mL 45.3     Globulin, Total 1.5 - 4.5 g/dL 2.3     WBC 3.4 - 89.1 x10E3/uL 8.6     RBC 3.77 - 5.28 x10E6/uL 5.03     Hemoglobin 11.1 - 15.9 g/dL 84.6     HCT 65.9 - 53.3 % 46.1     MCV 79 - 97 fL 92     MCH 26.6 - 33.0 pg 30.4     MCHC 31.5 - 35.7 g/dL 66.7     RDW 88.2 - 84.5 % 13.2     Platelets 150 - 450 x10E3/uL 206     Hematology Comments:  Note:     Neutrophils Not Estab.  % 58     Immature Granulocytes Not Estab. % 1     NEUT# 1.4 - 7.0 x10E3/uL 5.2     Lymphs Abs 0.7 - 3.1 x10E3/uL 2.6     Monocytes Absolute 0.1 - 0.9 x10E3/uL 0.7     Basophils Absolute 0.0 - 0.2 x10E3/uL 0.1     Immature Grans (Abs) 0.0 - 0.1 x10E3/uL 0.1     Lymphs Not Estab. % 30     Monocytes Not Estab. % 8  Basos Not Estab. % 1     Eos Not Estab. % 2     EOS (ABSOLUTE) 0.0 - 0.4 x10E3/uL 0.1     Hemoglobin A1C 4.0 - 5.6 % -  8.6 (H) 6.0 ! Pend  5.0 Pend  Hgb A1C, POC (prediabetic range)   Pend  Pend  Hgb A1C, POC (controlled diabetic range)   Pend  Pend  TSH 0.450 - 4.500 uIU/mL 1.350     Thyroxine (T4) 4.5 - 12.0 ug/dL 9.9     Free Thyroxine Index 1.2 - 4.9  2.9     T3 Uptake Ratio 24 - 39 % 29     (H): Data is abnormally high (L): Data is abnormally low !: Data is abnormal    Component Ref Range & Units (hover) 1 yr ago  Magnesium 1.8  Resulting Agency [id number]         Narrative Performed by: HOYT Performed at:  [address] [facility] [address] [place], [place]  [id number] Lab Director: Frankey Sas MD, Phone:  [phone]  Specimen Collected: 12/13/22 12:33 Last Resulted: 12/14/22 09:37

## 2024-08-26 NOTE — Telephone Encounter (Signed)
 Patient reported she has received weight loss one time bonus in the past from employer and not eligible for second weight loss bonus patient notified today.  Previous Be Well  weight 239lbs 11/15/22 given 90 tabs each omeprazole  DR 20mg , rosuvastatin  5mg  and amlodipine  10mg 

## 2024-09-30 ENCOUNTER — Ambulatory Visit: Admitting: General Practice

## 2024-09-30 DIAGNOSIS — Z23 Encounter for immunization: Secondary | ICD-10-CM | POA: Insufficient documentation

## 2024-10-28 ENCOUNTER — Other Ambulatory Visit: Payer: Self-pay | Admitting: Registered Nurse

## 2024-10-28 ENCOUNTER — Other Ambulatory Visit: Payer: Self-pay | Admitting: General Practice

## 2024-10-28 VITALS — Ht 67.5 in | Wt 168.0 lb

## 2024-10-28 DIAGNOSIS — Z0000 Encounter for general adult medical examination without abnormal findings: Secondary | ICD-10-CM

## 2024-10-28 NOTE — Progress Notes (Signed)
 [facility] lab work drawn, sending to [facility] this afternoon. Cc entered for results to be sent to K. Gretta, NP.

## 2024-10-29 ENCOUNTER — Ambulatory Visit: Payer: Self-pay | Admitting: Registered Nurse

## 2024-10-29 LAB — CMP12+LP+TP+TSH+6AC+CBC/D/PLT
ALT: 13 IU/L (ref 0–32)
AST: 17 IU/L (ref 0–40)
Albumin: 4.8 g/dL (ref 3.8–4.9)
Alkaline Phosphatase: 92 IU/L (ref 49–135)
BUN/Creatinine Ratio: 16 (ref 12–28)
BUN: 10 mg/dL (ref 8–27)
Basophils Absolute: 0.1 x10E3/uL (ref 0.0–0.2)
Basos: 1 %
Bilirubin Total: 1.1 mg/dL (ref 0.0–1.2)
Calcium: 9.7 mg/dL (ref 8.7–10.3)
Chloride: 101 mmol/L (ref 96–106)
Chol/HDL Ratio: 2.5 ratio (ref 0.0–4.4)
Cholesterol, Total: 131 mg/dL (ref 100–199)
Creatinine, Ser: 0.64 mg/dL (ref 0.57–1.00)
EOS (ABSOLUTE): 0.2 x10E3/uL (ref 0.0–0.4)
Eos: 3 %
Free Thyroxine Index: 2.2 (ref 1.2–4.9)
GGT: 9 IU/L (ref 0–60)
Globulin, Total: 2.1 g/dL (ref 1.5–4.5)
Glucose: 79 mg/dL (ref 70–99)
HDL: 53 mg/dL
Hematocrit: 46.6 % (ref 34.0–46.6)
Hemoglobin: 15.6 g/dL (ref 11.1–15.9)
Immature Grans (Abs): 0 x10E3/uL (ref 0.0–0.1)
Immature Granulocytes: 0 %
Iron: 120 ug/dL (ref 27–159)
LDH: 171 IU/L (ref 119–226)
LDL Chol Calc (NIH): 62 mg/dL (ref 0–99)
Lymphocytes Absolute: 1.8 x10E3/uL (ref 0.7–3.1)
Lymphs: 32 %
MCH: 31.6 pg (ref 26.6–33.0)
MCHC: 33.5 g/dL (ref 31.5–35.7)
MCV: 94 fL (ref 79–97)
Monocytes Absolute: 0.5 x10E3/uL (ref 0.1–0.9)
Monocytes: 9 %
Neutrophils Absolute: 3 x10E3/uL (ref 1.4–7.0)
Neutrophils: 55 %
Phosphorus: 3.7 mg/dL (ref 3.0–4.3)
Platelets: 187 x10E3/uL (ref 150–450)
Potassium: 4.4 mmol/L (ref 3.5–5.2)
RBC: 4.94 x10E6/uL (ref 3.77–5.28)
RDW: 13.1 % (ref 11.7–15.4)
Sodium: 137 mmol/L (ref 134–144)
T3 Uptake Ratio: 30 % (ref 24–39)
T4, Total: 7.4 ug/dL (ref 4.5–12.0)
TSH: 0.946 u[IU]/mL (ref 0.450–4.500)
Total Protein: 6.9 g/dL (ref 6.0–8.5)
Triglycerides: 83 mg/dL (ref 0–149)
Uric Acid: 2.7 mg/dL — ABNORMAL LOW (ref 3.0–7.2)
VLDL Cholesterol Cal: 16 mg/dL (ref 5–40)
WBC: 5.5 x10E3/uL (ref 3.4–10.8)
eGFR: 101 mL/min/1.73

## 2024-10-29 LAB — HEMOGLOBIN A1C
Est. average glucose Bld gHb Est-mCnc: 91 mg/dL
Hgb A1c MFr Bld: 4.8 % (ref 4.8–5.6)

## 2024-10-30 ENCOUNTER — Other Ambulatory Visit (HOSPITAL_BASED_OUTPATIENT_CLINIC_OR_DEPARTMENT_OTHER): Payer: Self-pay

## 2024-10-30 ENCOUNTER — Encounter: Payer: Self-pay | Admitting: Primary Care

## 2024-10-30 ENCOUNTER — Ambulatory Visit: Admitting: Primary Care

## 2024-10-30 DIAGNOSIS — E1165 Type 2 diabetes mellitus with hyperglycemia: Secondary | ICD-10-CM | POA: Diagnosis not present

## 2024-10-30 DIAGNOSIS — Z7985 Long-term (current) use of injectable non-insulin antidiabetic drugs: Secondary | ICD-10-CM

## 2024-10-30 MED ORDER — MOUNJARO 10 MG/0.5ML ~~LOC~~ SOAJ
10.0000 mg | SUBCUTANEOUS | 1 refills | Status: AC
  Filled 2024-10-30: qty 6, 84d supply, fill #0

## 2024-10-30 NOTE — Assessment & Plan Note (Signed)
 Controlled and improved with A1C of 4.8.  Discontinue metformin  ER 500 mg.  She agrees Continue Mounjaro  10 mg weekly  Follow-up in 6 months.

## 2024-10-30 NOTE — Patient Instructions (Signed)
 Stop taking metformin  ER 500 mg tablets for your diabetes.  Continue taking Mounjaro  10 mg once weekly for diabetes  Please schedule a physical to meet with me in 6 months.   It was a pleasure to see you today!

## 2024-10-30 NOTE — Progress Notes (Signed)
 "  Subjective:    Patient ID: Diana Lutz, female    DOB: 09-Jan-1966, 59 y.o.   MRN: [id number]  Diana Lutz is a very pleasant 59 y.o. female with a history of hypertension, OSA, type 2 diabetes, hyperlipidemia who presents today for follow-up of diabetes.  1) Type 2 Diabetes:  Current medications include: Mounjaro  10 mg weekly, metformin  ER 500 mg daily.   She is checking her blood glucose a few  times weekly and is getting readings of:  AM fasting: 70s 2 hours after eating: low 100's   Last A1C: 5.0 in July 2025, 4.8 in January 2026 Last Eye Exam: Due Last Foot Exam: UTD Pneumonia Vaccination: 2021 Urine Microalbumin: Up-to-date Statin: Rosuvastatin   Dietary changes since last visit: She is working to increase water  and fiber intake. She has increased protein and veggies.    Exercise: just began working out in the gym 3 days weekly.   BP Readings from Last 3 Encounters:  10/30/24 116/78  04/23/24 110/66  03/18/24 128/87   Wt Readings from Last 3 Encounters:  10/30/24 166 lb 4 oz (75.4 kg)  10/28/24 168 lb (76.2 kg)  04/23/24 197 lb (89.4 kg)       Review of Systems  Eyes:  Negative for visual disturbance.  Respiratory:  Negative for shortness of breath.   Cardiovascular:  Negative for chest pain.  Neurological:  Negative for numbness.         Past Medical History:  Diagnosis Date   Abnormal Pap smear of cervix 1991   Acute right-sided thoracic back pain 05/17/2021   Anemia    history of anemia   Diabetes mellitus without complication (HCC)    Dysplasia of cervix    cervical ca   GERD (gastroesophageal reflux disease)    Hx of adenomatous polyp of colon 05/30/2020   Hypertension    Migraines    NAFLD (nonalcoholic fatty liver disease) 96/78/7976   Right upper quadrant abdominal pain 06/15/2021    Social History   Socioeconomic History   Marital status: Married    Spouse name: Not on file   Number of children: Not on file    Years of education: Not on file   Highest education level: Associate degree: occupational, scientist, product/process development, or vocational program  Occupational History   Not on file  Tobacco Use   Smoking status: Never   Smokeless tobacco: Never  Vaping Use   Vaping status: Never Used  Substance and Sexual Activity   Alcohol use: Not Currently    Alcohol/week: 0.0 standard drinks of alcohol    Comment: social   Drug use: No   Sexual activity: Not Currently  Other Topics Concern   Not on file  Social History Narrative   Married.   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   Enjoys four wheeling, working on the house   Social Drivers of Health   Tobacco Use: Low Risk (10/30/2024)   Patient History    Smoking Tobacco Use: Never    Smokeless Tobacco Use: Never    Passive Exposure: Not on file  Financial Resource Strain: Low Risk (10/29/2024)   Overall Financial Resource Strain (CARDIA)    Difficulty of Paying Living Expenses: Not very hard  Food Insecurity: No Food Insecurity (10/29/2024)   Epic    Worried About Programme Researcher, Broadcasting/film/video in the Last Year: Never true    Ran Out of Food in the Last Year: Never true  Transportation Needs: No  Transportation Needs (10/29/2024)   Epic    Lack of Transportation (Medical): No    Lack of Transportation (Non-Medical): No  Physical Activity: Insufficiently Active (10/29/2024)   Exercise Vital Sign    Days of Exercise per Week: 3 days    Minutes of Exercise per Session: 30 min  Stress: No Stress Concern Present (10/29/2024)   [employer] of Occupational Health - Occupational Stress Questionnaire    Feeling of Stress: Not at all  Social Connections: Socially Integrated (10/29/2024)   Social Connection and Isolation Panel    Frequency of Communication with Friends and Family: More than three times a week    Frequency of Social Gatherings with Friends and Family: Twice a week    Attends Religious Services: More than 4 times per year    Active  Member of [employer] or Organizations: Yes    Attends Banker Meetings: More than 4 times per year    Marital Status: Married  Catering Manager Violence: Not on file  Depression (PHQ2-9): Low Risk (10/30/2024)   Depression (PHQ2-9)    PHQ-2 Score: 0  Alcohol Screen: Not on file  Housing: Low Risk (10/29/2024)   Epic    Unable to Pay for Housing in the Last Year: No    Number of Times Moved in the Last Year: 0    Homeless in the Last Year: No  Utilities: Not on file  Health Literacy: Not on file    Past Surgical History:  Procedure Laterality Date   ANTERIOR AND POSTERIOR REPAIR N/A 08/18/2015   Procedure: ANTERIOR (CYSTOCELE) AND POSTERIOR REPAIR (RECTOCELE)/Perineorrhaphy;  Surgeon: Robbi Render, MD;  Location: [facility];  Service: Gynecology;  Laterality: N/A;  2 hrs.   BLADDER SUSPENSION N/A 08/18/2015   Procedure: TRANSVAGINAL TAPE (TVT) Mid-Urethral Sling PROCEDURE;  Surgeon: Robbi Render, MD;  Location: [facility];  Service: Gynecology;  Laterality: N/A;   COLONOSCOPY  11/15/2005   normal-Dr.Gessner   CYSTOSCOPY N/A 08/18/2015   Procedure: CYSTOSCOPY;  Surgeon: Robbi Render, MD;  Location: [facility];  Service: Gynecology;  Laterality: N/A;   HYSTEROSCOPY W/ ENDOMETRIAL ABLATION     LASER ABLATION OF THE CERVIX     WISDOM TOOTH EXTRACTION      Family History  Problem Relation Age of Onset   Hypertension Mother    Diverticulosis Mother    Colon polyps Mother    Hypertension Father    Stroke Father    Heart attack Father    Prostate cancer Father    Bladder Cancer Father    Hypertension Brother    Diabetes Brother    Colon cancer Maternal Grandmother 55   Esophageal cancer Neg Hx    Rectal cancer Neg Hx    Stomach cancer Neg Hx    Breast cancer Neg Hx     Allergies[1]  Medications Ordered Prior to Encounter[2]  BP 116/78   Pulse 79   Temp 98 F (36.7 C) (Oral)   Ht 5' 7 (1.702 m)   Wt 166 lb 4 oz (75.4 kg)   SpO2 99%   BMI 26.04 kg/m  Objective:    Physical Exam Cardiovascular:     Rate and Rhythm: Normal rate and regular rhythm.  Pulmonary:     Effort: Pulmonary effort is normal.     Breath sounds: Normal breath sounds.  Musculoskeletal:     Cervical back: Neck supple.  Skin:    General: Skin is warm and dry.  Neurological:     Mental Status: She is  alert and oriented to person, place, and time.  Psychiatric:        Mood and Affect: Mood normal.     Physical Exam        Assessment & Plan:  Type 2 diabetes mellitus with hyperglycemia, without long-term current use of insulin (HCC) Assessment & Plan: Controlled and improved with A1C of 4.8.  Discontinue metformin  ER 500 mg.  She agrees Continue Mounjaro  10 mg weekly  Follow-up in 6 months.  Orders: -     Mounjaro ; Inject 10 mg into the skin once a week. for diabetes.  Dispense: 6 mL; Refill: 1    Assessment and Plan Assessment & Plan         Comer MARLA Gaskins, NP       [1] No Known Allergies [2]  Current Outpatient Medications on File Prior to Visit  Medication Sig Dispense Refill   albuterol  (VENTOLIN  HFA) 108 (90 Base) MCG/ACT inhaler Inhale 1-2 puffs into the lungs every 4 (four) hours as needed for wheezing or shortness of breath. 1 each 0   amLODipine  (NORVASC ) 10 MG tablet Take 1 tablet (10 mg total) by mouth daily. 90 tablet 1   calcium  carbonate (TUMS - DOSED IN MG ELEMENTAL CALCIUM ) 500 MG chewable tablet Chew 2 tablets by mouth daily as needed for indigestion or heartburn.     Cholecalciferol  (D3 5000) 125 MCG (5000 UT) capsule Take 1 capsule (5,000 Units total) by mouth daily. Take with meal 200 capsule 3   [employer] LUBRICANT EYE DROPS 0.4-0.3 % SOLN APPLY 1 DROP TO EYE 3 (THREE) TIMES DAILY.  12   fluticasone  (FLONASE ) 50 MCG/ACT nasal spray Place 1 spray into both nostrils 2 (two) times daily. 48 mL 2   ibuprofen (ADVIL,MOTRIN) 600 MG tablet Take 600 mg by mouth daily as needed.     ipratropium (ATROVENT ) 0.03 % nasal spray Place 2 sprays  into the nose 3 (three) times daily. 30 mL 0   losartan  (COZAAR ) 100 MG tablet Take 1 tablet (100 mg total) by mouth daily for blood pressure. 90 tablet 3   omeprazole  (PRILOSEC) 20 MG capsule Take 1 capsule (20 mg total) by mouth daily. 90 capsule 3   rosuvastatin  (CRESTOR ) 5 MG tablet Take 1 tablet (5 mg total) by mouth daily. 90 tablet 2   sodium chloride  (OCEAN) 0.65 % SOLN nasal spray Place 2 sprays into both nostrils every 2 (two) hours while awake.     No current facility-administered medications on file prior to visit.   "

## 2024-11-10 ENCOUNTER — Other Ambulatory Visit: Payer: Self-pay | Admitting: Primary Care

## 2024-11-10 DIAGNOSIS — E1165 Type 2 diabetes mellitus with hyperglycemia: Secondary | ICD-10-CM

## 2025-04-29 ENCOUNTER — Encounter: Admitting: Primary Care
